# Patient Record
Sex: Female | Born: 1935
Health system: Southern US, Community
[De-identification: ages and names within clinical notes are randomized; demographics above are authoritative.]

## PROBLEM LIST (undated history)

## (undated) DIAGNOSIS — E785 Hyperlipidemia, unspecified: Secondary | ICD-10-CM

## (undated) DIAGNOSIS — I701 Atherosclerosis of renal artery: Secondary | ICD-10-CM

## (undated) DIAGNOSIS — I517 Cardiomegaly: Secondary | ICD-10-CM

## (undated) DIAGNOSIS — I771 Stricture of artery: Secondary | ICD-10-CM

## (undated) DIAGNOSIS — E079 Disorder of thyroid, unspecified: Secondary | ICD-10-CM

## (undated) DIAGNOSIS — R011 Cardiac murmur, unspecified: Secondary | ICD-10-CM

## (undated) DIAGNOSIS — I10 Essential (primary) hypertension: Secondary | ICD-10-CM

## (undated) DIAGNOSIS — I2510 Atherosclerotic heart disease of native coronary artery without angina pectoris: Secondary | ICD-10-CM

## (undated) DIAGNOSIS — G4730 Sleep apnea, unspecified: Secondary | ICD-10-CM

## (undated) HISTORY — DX: Cardiomegaly: I51.7

## (undated) HISTORY — DX: Atherosclerotic heart disease of native coronary artery without angina pectoris: I25.10

## (undated) HISTORY — DX: Atherosclerosis of renal artery: I70.1

## (undated) HISTORY — DX: Sleep apnea, unspecified: G47.30

## (undated) HISTORY — DX: Cardiac murmur, unspecified: R01.1

## (undated) HISTORY — DX: Essential (primary) hypertension: I10

## (undated) HISTORY — DX: Stricture of artery: I77.1

## (undated) HISTORY — PX: CHOLECYSTECTOMY: SHX55

## (undated) HISTORY — DX: Hyperlipidemia, unspecified: E78.5

## (undated) HISTORY — DX: Disorder of thyroid, unspecified: E07.9

---

## 1999-09-15 ENCOUNTER — Other Ambulatory Visit: Admission: RE | Admit: 1999-09-15 | Discharge: 1999-09-15 | Payer: Self-pay | Admitting: Internal Medicine

## 1999-12-04 ENCOUNTER — Inpatient Hospital Stay (HOSPITAL_COMMUNITY): Admission: EM | Admit: 1999-12-04 | Discharge: 1999-12-06 | Payer: Self-pay | Admitting: Emergency Medicine

## 1999-12-04 ENCOUNTER — Encounter: Payer: Self-pay | Admitting: Emergency Medicine

## 1999-12-04 ENCOUNTER — Encounter (INDEPENDENT_AMBULATORY_CARE_PROVIDER_SITE_OTHER): Payer: Self-pay | Admitting: *Deleted

## 2008-11-01 ENCOUNTER — Emergency Department (HOSPITAL_COMMUNITY): Admission: EM | Admit: 2008-11-01 | Discharge: 2008-11-01 | Payer: Self-pay | Admitting: Emergency Medicine

## 2010-11-14 ENCOUNTER — Encounter
Admission: RE | Admit: 2010-11-14 | Discharge: 2010-11-14 | Payer: Self-pay | Source: Home / Self Care | Attending: Rheumatology | Admitting: Rheumatology

## 2011-03-24 NOTE — Discharge Summary (Signed)
[facility]  Patient:    Linda Lucero                       MRN: [id number] Adm. Date:  [id number] Disc. Date: [id number] Attending:  Bonnetta Barry CC:         Merlene Laughter. Renae Gloss, M.D.             Madaline Savage, M.D.                           Discharge Summary  REASON FOR ADMISSION:  Acute cholecystitis, cholelithiasis.  HISTORY OF PRESENT ILLNESS:  Patient is a 77 year old black female who presented to the emergency department on December 04, 1999, with a five-hour history of unrelenting right upper quadrant abdominal pain, nausea and vomiting.  The patient was evaluated in the emergency department.  On physical exam, she was suspected to have acute cholecystitis.  Ultrasound of the abdomen showed multiple gallstones and findings consistent with acute cholecystitis.  General surgery was consulted and the patient was admitted on the general surgical service for administration of intravenous antibiotics in preparation for the operating room.  HOSPITAL COURSE:  Patient was admitted on November 24, 1999.  She was started on  intravenous antibiotics.  Patient was prepared and taken to the operating room n the morning of December 05, 1999.  She underwent laparoscopic cholecystectomy without complication.  Postoperative course was straightforward.  She was prepared for discharge home on December 06, 1999.  DISCHARGE PLANNING:  Patient was prepared for discharge on December 06, 1999. She is tolerating a soft, regular diet without difficulty.  Patient was ambulating independently.  The patient was controlling her pain with oral Vicodin. Patient will be seen back in my office at [facility] in two weeks.  FINAL DIAGNOSIS:  Acute cholecystitis, cholelithiasis.  CONDITION AT DISCHARGE:  Improved. DD:  01/03/00 TD:  01/03/00 Job: [id number]

## 2011-03-24 NOTE — H&P (Signed)
. [facility]  Patient:    Linda Lucero                       MRN: [id number] Adm. Date:  [id number] Attending:  Bonnetta Barry CC:         Madaline Savage, M.D.             Merlene Laughter. Renae Gloss, M.D.                         History and Physical  REASON FOR ADMISSION:  Acute cholecystitis, cholelithiasis.  BRIEF HISTORY:  Patient is a 77 year old black female who presented to the Emergency Department at 10 a.m. on December 04, 1999, with a five hour history of unrelenting right upper quadrant abdominal pain, nausea, and vomiting.  Patient was evaluated in the Emergency Department.  She underwent ultrasound of the abdomen  which demonstrated multiple gallstones with a positive Murphys sign and suspected acute cholecystitis.  Laboratory studies showed a white blood cell count in the  upper range of normal at 10,000 with a left shift of 81% neutrophils.  Liver function tests were normal.  Patient was afebrile.  General surgery was consulted at 5 p.m. on December 04, 1999 for consideration for admission for cholecystitis. Patient has no prior history of hepatobiliary disease.  She has had intermittent abdominal pain over the past several years for which she has taken antiacid medications.  She denies any history of jaundice or acholic stools.  She denies any fevers or chills.  Patient has had no prior abdominal surgery.  PAST MEDICAL HISTORY:  History of hypertension, history of hypothyroidism with  history of iodine radiation as a child.  PRIMARY CARE PHYSICIAN:  Merlene Laughter. Renae Gloss, M.D.  CARDIOLOGIST:  Madaline Savage, M.D.  MEDICATIONS: 1. Dyazide daily. 2. Prinivil 20 mg daily. 3. Norvasc 10 mg daily. 4. Synthroid 0.1 mg daily.  ALLERGIES:  None known.  SOCIAL HISTORY:  Patient lives alone here in [place].  She has a son who visits her daily.  She does not use tobacco.  She drinks alcohol on rare occasion. 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.  FAMILY HISTORY:  Noncontributory.  REVIEW OF SYSTEMS:  As per history of present illness.  PHYSICAL EXAMINATION:  GENERAL:  A 77 year old moderately obese black female in mild discomfort on a stretcher in the Emergency Department.  VITAL SIGNS:  Temperature 98.8, pulse 60, respirations 22, blood pressure 170/50.  HEENT:  Normocephalic.  Sclerae are clear.  Mucous membranes are dry.  NECK:  Supple.  Thyroid is normal without masses.  Voice quality is normal.  CHEST:  Clear to auscultation bilaterally.  There is no costovertebral angle tenderness.  CARDIAC:  Shows a regular rate and rhythm without murmur.  Peripheral pulses are full.  ABDOMEN:  Soft, obese, with bowel sounds present.  There is tenderness to percussion and palpation in the right upper quadrant.  There is no rebound tenderness.  There is no palpable mass.  There is a mild Murphys sign present with deep inspiration.  There are no previous surgical wounds.  EXTREMITIES:  Nontender without edema.  NEUROLOGIC:  The patient is alert and oriented to person, place, and time without focal neurologic deficit.  LABORATORY STUDIES:  White blood cell count is 10,100 with 81% segmented neutrophils, 15% lymphocytes, 2% monocytes, 1% eosinophils.  Hemoglobin 11.9, hematocrit 34.7%.  Chemistry profile  is essentially normal with the following abnormalities:  potassium 3.4, glucose 148.  Liver function tests are normal. Amylase is normal at 96.  Lipase is normal at 24.  Urinalysis shows 0-5 white cells and 0-5 red cells per high power field.  Radiographic studies:  Chest x-ray shows no active disease.  Abdominal films show nonobstructive bowel gas pattern with some element of constipation.  Ultrasound of the abdomen shows gallstones with a Murphys sign present.  Bile duct is normal. I suspect cholecystitis.  IMPRESSION:  Acute cholecystitis,  cholelithiasis.  PLAN: 1. Admission to [facility]. 2. Initiation of intravenous hydration. 3. Cefotan 1 g IV piggyback q.12. 4. Preoperative assessment and preparation for surgery on December 05, 1999,    with planned laparoscopic cholecystectomy.  Patient is admitted on this general surgery service.  Her diagnosis as well as planned procedure has been discussed with her at length.  She understands.  She  understands the possibility of conversion from a laparoscopic to an open technique in order to remove the gallbladder successfully.  She wishes to proceed as soon as possible.  We will make these arrangements with the [facility] operating room.DD:  12/04/99 TD:  12/04/99 Job: [id number]

## 2011-03-24 NOTE — Op Note (Signed)
[place]  Patient:    Linda Lucero                       MRN: [id number] Proc. Date: 12/05/99 Adm. Date:  [id number] Attending:  Bonnetta Barry CC:         Madaline Savage, M.D.             Merlene Laughter. Renae Gloss, M.D.                           Operative Report  PREOPERATIVE DIAGNOSIS:  Acute cholecystitis, cholelithiasis.  POSTOPERATIVE DIAGNOSIS:  Acute cholecystitis, cholelithiasis.  PROCEDURE:  Laparoscopic cholecystectomy.  SURGEON:  Velora Heckler, M.D.  ASSISTANT:  Catalina Lunger, M.D.  ANESTHESIA:  General per Dr. Ivin Booty.  ESTIMATED BLOOD LOSS:  Less than 100 cc.  PREPARATION:  Betadine.  COMPLICATIONS:  None.  INDICATIONS:  Patient is a 77 year old black female, who presents to the emergency department with abdominal pain.  Ultrasound demonstrated multiple gallstones. Physical examination was consistent with acute cholecystitis.  Patient was admitted for intravenous antibiotics and is now brought to the operating room for cholecystectomy.  BODY OF REPORT:  Procedure was done in O.R. #9 at the [facility].  Patient was brought to the operating room, placed in the supine position on the operating room table.  Following administration of general anesthesia, the patient was positioned and prepped and draped in usual strict aseptic fashion.  After ascertaining that an adequate level of anesthesia had been obtained, an infraumbilical incision was made with a #15 blade.  Dissection was carried down to the fascia.  The fascia was incised in the midline, the peritoneal cavity is entered cautiously.  An 0 Vicryl purse-string suture was placed in the fascia. An Hasson cannula is introduced under direct vision and secured with the purse-string suture.  The abdomen is insufflated with carbon dioxide.  Laparoscope is introduced under direct vision and the abdomen explored.  There are omental  adhesions in the right upper quadrant to the gallbladder and the anterior abdominal wall.  An operative port is placed in the subxiphoid position.  Using the electrocautery,  adhesions were taken down and hemostasis obtained.  Next, two ports were placed in right abdominal wall.  The gallbladder is aspirated with an aspiration trocar. The gallbladder wall is markedly thickened, edematous and erythematous consistent with acute cholecystitis.  Gallbladder is grasped and retracted cephalad. Dissection is begun at the neck of the gallbladder.  Peritoneum is incised.  Cystic duct is dissected out along its length, triply clipped and divided.  The cystic artery s dissected out along its length, doubly clipped, and divided.  After mobilization of the neck of the gallbladder, a posterior branch of the cystic artery is identified, doubly clipped, and divided.  The gallbladder is then excised from the gallbladder bed using the spatula electrocautery for hemostasis.  The bed is acutely inflamed with moderate edema.   Gallbladder is completely freed from the undersurface of the liver and then extracted through the umbilical port.  It contained multiple gallstones ranging in size up to 2 cm.  It is submitted to pathology for review. The 0 Vicryl purse-string suture at the umbilicus is tied securely. Subcutaneous tissues were irrigated.  Right upper quadrant is irrigated and good hemostasis as noted.  Fluid was evacuated.  Ports are removed under direct vision.  Good hemostasis was noted.  Pneumoperitoneum was released and all ports were removed. Port sites were anesthetized with local anesthetic.  Midline surgical wounds were closed with interrupted 4-0 Vicryl subcuticular sutures.  All four wounds were wounds are washed and dried and benzoin and Steri-Strips are applied.   Sterile  gauze dressings are applied.  Patient is awakened from anesthesia and brought to the recovery room in  stable condition.  The patient tolerated the procedure well. DD:  12/05/99 TD:  12/05/99 Job: [id number]

## 2011-05-26 ENCOUNTER — Ambulatory Visit
Admission: RE | Admit: 2011-05-26 | Discharge: 2011-05-26 | Disposition: A | Discharge status: Home / Self Care | Payer: Medicare Other | Source: Ambulatory Visit | Attending: Rheumatology | Admitting: Rheumatology

## 2011-05-26 ENCOUNTER — Other Ambulatory Visit: Payer: Self-pay | Admitting: Rheumatology

## 2011-05-26 DIAGNOSIS — M549 Dorsalgia, unspecified: Secondary | ICD-10-CM

## 2012-09-19 ENCOUNTER — Other Ambulatory Visit (HOSPITAL_COMMUNITY): Payer: Self-pay | Admitting: Cardiovascular Disease

## 2012-09-19 DIAGNOSIS — I701 Atherosclerosis of renal artery: Secondary | ICD-10-CM

## 2012-09-25 ENCOUNTER — Ambulatory Visit (HOSPITAL_COMMUNITY)
Admission: RE | Admit: 2012-09-25 | Discharge: 2012-09-25 | Disposition: A | Discharge status: Home / Self Care | Payer: Medicare Other | Source: Ambulatory Visit | Attending: Cardiology | Admitting: Cardiology

## 2012-09-25 DIAGNOSIS — I701 Atherosclerosis of renal artery: Secondary | ICD-10-CM | POA: Insufficient documentation

## 2012-09-26 NOTE — Progress Notes (Signed)
Renal Duplex Completed. Kristoph Sattler D  

## 2012-10-02 ENCOUNTER — Other Ambulatory Visit (HOSPITAL_COMMUNITY): Payer: Self-pay | Admitting: Cardiology

## 2015-06-10 ENCOUNTER — Encounter: Payer: Self-pay | Admitting: *Deleted

## 2015-07-27 ENCOUNTER — Encounter: Payer: Self-pay | Admitting: Cardiology

## 2015-11-12 DIAGNOSIS — E039 Hypothyroidism, unspecified: Secondary | ICD-10-CM | POA: Diagnosis not present

## 2015-11-12 DIAGNOSIS — I119 Hypertensive heart disease without heart failure: Secondary | ICD-10-CM | POA: Diagnosis not present

## 2015-11-12 DIAGNOSIS — E782 Mixed hyperlipidemia: Secondary | ICD-10-CM | POA: Diagnosis not present

## 2016-01-05 DIAGNOSIS — I10 Essential (primary) hypertension: Secondary | ICD-10-CM | POA: Diagnosis not present

## 2016-01-05 DIAGNOSIS — M25511 Pain in right shoulder: Secondary | ICD-10-CM | POA: Diagnosis not present

## 2016-01-05 DIAGNOSIS — M25562 Pain in left knee: Secondary | ICD-10-CM | POA: Diagnosis not present

## 2016-01-05 DIAGNOSIS — E039 Hypothyroidism, unspecified: Secondary | ICD-10-CM | POA: Diagnosis not present

## 2016-01-17 ENCOUNTER — Encounter: Payer: Self-pay | Admitting: Internal Medicine

## 2016-05-15 DIAGNOSIS — E782 Mixed hyperlipidemia: Secondary | ICD-10-CM | POA: Diagnosis not present

## 2016-05-15 DIAGNOSIS — R0600 Dyspnea, unspecified: Secondary | ICD-10-CM | POA: Diagnosis not present

## 2016-05-15 DIAGNOSIS — E039 Hypothyroidism, unspecified: Secondary | ICD-10-CM | POA: Diagnosis not present

## 2016-05-15 DIAGNOSIS — R7303 Prediabetes: Secondary | ICD-10-CM | POA: Diagnosis not present

## 2016-05-15 DIAGNOSIS — I119 Hypertensive heart disease without heart failure: Secondary | ICD-10-CM | POA: Diagnosis not present

## 2016-05-19 ENCOUNTER — Ambulatory Visit: Payer: Self-pay | Admitting: Internal Medicine

## 2016-05-26 ENCOUNTER — Ambulatory Visit: Payer: Self-pay | Admitting: Internal Medicine

## 2016-10-02 DIAGNOSIS — Z13820 Encounter for screening for osteoporosis: Secondary | ICD-10-CM | POA: Diagnosis not present

## 2016-10-02 DIAGNOSIS — R7303 Prediabetes: Secondary | ICD-10-CM | POA: Diagnosis not present

## 2016-10-02 DIAGNOSIS — N959 Unspecified menopausal and perimenopausal disorder: Secondary | ICD-10-CM | POA: Diagnosis not present

## 2016-10-02 DIAGNOSIS — I10 Essential (primary) hypertension: Secondary | ICD-10-CM | POA: Diagnosis not present

## 2016-10-02 DIAGNOSIS — E039 Hypothyroidism, unspecified: Secondary | ICD-10-CM | POA: Diagnosis not present

## 2016-10-02 DIAGNOSIS — E782 Mixed hyperlipidemia: Secondary | ICD-10-CM | POA: Diagnosis not present

## 2016-10-02 DIAGNOSIS — Z0000 Encounter for general adult medical examination without abnormal findings: Secondary | ICD-10-CM | POA: Diagnosis not present

## 2017-01-01 DIAGNOSIS — Z87891 Personal history of nicotine dependence: Secondary | ICD-10-CM | POA: Diagnosis not present

## 2017-01-01 DIAGNOSIS — E782 Mixed hyperlipidemia: Secondary | ICD-10-CM | POA: Diagnosis not present

## 2017-01-01 DIAGNOSIS — E039 Hypothyroidism, unspecified: Secondary | ICD-10-CM | POA: Diagnosis not present

## 2017-01-01 DIAGNOSIS — I10 Essential (primary) hypertension: Secondary | ICD-10-CM | POA: Diagnosis not present

## 2017-04-18 DIAGNOSIS — I10 Essential (primary) hypertension: Secondary | ICD-10-CM | POA: Diagnosis not present

## 2017-04-18 DIAGNOSIS — N183 Chronic kidney disease, stage 3 (moderate): Secondary | ICD-10-CM | POA: Diagnosis not present

## 2017-04-18 DIAGNOSIS — E039 Hypothyroidism, unspecified: Secondary | ICD-10-CM | POA: Diagnosis not present

## 2017-04-18 DIAGNOSIS — I129 Hypertensive chronic kidney disease with stage 1 through stage 4 chronic kidney disease, or unspecified chronic kidney disease: Secondary | ICD-10-CM | POA: Diagnosis not present

## 2017-04-18 DIAGNOSIS — E782 Mixed hyperlipidemia: Secondary | ICD-10-CM | POA: Diagnosis not present

## 2017-07-26 DIAGNOSIS — I10 Essential (primary) hypertension: Secondary | ICD-10-CM | POA: Diagnosis not present

## 2017-07-26 DIAGNOSIS — E039 Hypothyroidism, unspecified: Secondary | ICD-10-CM | POA: Diagnosis not present

## 2017-07-26 DIAGNOSIS — R252 Cramp and spasm: Secondary | ICD-10-CM | POA: Diagnosis not present

## 2017-08-12 ENCOUNTER — Encounter (HOSPITAL_COMMUNITY): Payer: Self-pay | Admitting: Emergency Medicine

## 2017-08-12 ENCOUNTER — Emergency Department (HOSPITAL_COMMUNITY): Payer: Medicare HMO

## 2017-08-12 ENCOUNTER — Emergency Department (HOSPITAL_COMMUNITY)
Admission: EM | Admit: 2017-08-12 | Discharge: 2017-08-12 | Disposition: A | Discharge status: Home / Self Care | Payer: Medicare HMO | Attending: Emergency Medicine | Admitting: Emergency Medicine

## 2017-08-12 DIAGNOSIS — R42 Dizziness and giddiness: Secondary | ICD-10-CM

## 2017-08-12 DIAGNOSIS — I10 Essential (primary) hypertension: Secondary | ICD-10-CM | POA: Diagnosis not present

## 2017-08-12 DIAGNOSIS — R002 Palpitations: Secondary | ICD-10-CM | POA: Insufficient documentation

## 2017-08-12 DIAGNOSIS — I2510 Atherosclerotic heart disease of native coronary artery without angina pectoris: Secondary | ICD-10-CM | POA: Insufficient documentation

## 2017-08-12 DIAGNOSIS — R079 Chest pain, unspecified: Secondary | ICD-10-CM | POA: Diagnosis not present

## 2017-08-12 LAB — BASIC METABOLIC PANEL
ANION GAP: 9 (ref 5–15)
BUN: 22 mg/dL — ABNORMAL HIGH (ref 6–20)
CALCIUM: 9.3 mg/dL (ref 8.9–10.3)
CHLORIDE: 107 mmol/L (ref 101–111)
CO2: 22 mmol/L (ref 22–32)
Creatinine, Ser: 1.37 mg/dL — ABNORMAL HIGH (ref 0.44–1.00)
GFR calc non Af Amer: 34 mL/min — ABNORMAL LOW (ref >60)
GFR, EST AFRICAN AMERICAN: 39 mL/min — AB (ref >60)
Glucose, Bld: 107 mg/dL — ABNORMAL HIGH (ref 65–99)
Potassium: 3.7 mmol/L (ref 3.5–5.1)
Sodium: 138 mmol/L (ref 135–145)

## 2017-08-12 LAB — I-STAT TROPONIN, ED: TROPONIN I, POC: 0.02 ng/mL (ref 0.00–0.08)

## 2017-08-12 LAB — CBC
HCT: 31.9 % — ABNORMAL LOW (ref 36.0–46.0)
HEMOGLOBIN: 10.8 g/dL — AB (ref 12.0–15.0)
MCH: 21.5 pg — AB (ref 26.0–34.0)
MCHC: 33.9 g/dL (ref 30.0–36.0)
MCV: 63.5 fL — ABNORMAL LOW (ref 78.0–100.0)
Platelets: 242 10*3/uL (ref 150–400)
RBC: 5.02 MIL/uL (ref 3.87–5.11)
RDW: 16.3 % — ABNORMAL HIGH (ref 11.5–15.5)
WBC: 8.2 10*3/uL (ref 4.0–10.5)

## 2017-08-12 MED ORDER — ONDANSETRON 4 MG PO TBDP
4.0000 mg | ORAL_TABLET | Freq: Once | ORAL | Status: AC
  Administered 2017-08-12: 4 mg via ORAL
  Filled 2017-08-12: qty 1

## 2017-08-12 NOTE — ED Notes (Signed)
Family at bedside. 

## 2017-08-12 NOTE — ED Provider Notes (Signed)
[facility]-EMERGENCY DEPT Provider Note   CSN: [id number] Arrival date & time: 08/12/17  1034     History   Chief Complaint Chief Complaint  Patient presents with  . Chest Pain    HPI Linda Lucero is a 83 y.o. female.  Patient with the complaint of not feeling well. Had heart palpitations and dizziness that started during the night. No syncope no chest pain no abdominal pain. Did have some nausea but no vomiting. Patient now asymptomatic.      Past Medical History:  Diagnosis Date  . Coronary artery disease   . Hyperlipidemia   . Hypertension   . Renal artery stenosis (HCC)   . Sleep apnea   . Subclavian arterial stenosis (HCC)   . Systolic murmur   . Thyroid disease   . Ventricular hypertrophy     There are no active problems to display for this patient.   No past surgical history on file.  OB History    No data available       Home Medications    Prior to Admission medications   Not on File    Family History Family History  Problem Relation Age of Onset  . Heart disease Mother   . Lung cancer Father     Social History Social History  Substance Use Topics  . Smoking status: Not on file  . Smokeless tobacco: Not on file  . Alcohol use Not on file     Allergies   Diovan [valsartan] and Doxazosin   Review of Systems Review of Systems  Constitutional: Positive for fatigue. Negative for fever.  HENT: Negative for congestion.   Eyes: Negative for visual disturbance.  Respiratory: Negative for shortness of breath.   Cardiovascular: Positive for palpitations. Negative for chest pain.  Gastrointestinal: Positive for nausea. Negative for abdominal pain.  Genitourinary: Negative for dysuria.  Musculoskeletal: Negative for back pain.  Skin: Negative for rash.  Neurological: Positive for dizziness. Negative for syncope, weakness, numbness and headaches.  Hematological: Does not bruise/bleed easily.  Psychiatric/Behavioral: Negative for  confusion.     Physical Exam Updated Vital Signs BP (!) 152/62   Pulse 83   Temp 98.2 F (36.8 C)   Resp 13   SpO2 98%   Physical Exam  Constitutional: She is oriented to person, place, and time. She appears well-developed and well-nourished. No distress.  HENT:  Head: Normocephalic and atraumatic.  Mouth/Throat: Oropharynx is clear and moist.  Eyes: Pupils are equal, round, and reactive to light. Conjunctivae and EOM are normal.  Neck: Normal range of motion. Neck supple.  Cardiovascular: Normal rate, regular rhythm and normal heart sounds.   Pulmonary/Chest: Effort normal and breath sounds normal. No respiratory distress.  Abdominal: Soft. Bowel sounds are normal. There is no tenderness.  Musculoskeletal: Normal range of motion. She exhibits no edema.  Neurological: She is alert and oriented to person, place, and time. No cranial nerve deficit or sensory deficit. She exhibits normal muscle tone. Coordination normal.  Skin: Skin is warm.  Nursing note and vitals reviewed.    ED Treatments / Results  Labs (all labs ordered are listed, but only abnormal results are displayed) Labs Reviewed  BASIC METABOLIC PANEL - Abnormal; Notable for the following:       Result Value   Glucose, Bld 107 (*)    BUN 22 (*)    Creatinine, Ser 1.37 (*)    GFR calc non Af Amer 34 (*)    GFR calc Af Amer 39 (*)  All other components within normal limits  CBC - Abnormal; Notable for the following:    Hemoglobin 10.8 (*)    HCT 31.9 (*)    MCV 63.5 (*)    MCH 21.5 (*)    RDW 16.3 (*)    All other components within normal limits  I-STAT TROPONIN, ED    EKG  EKG Interpretation  Date/Time:  Sunday August 12 2017 10:37:25 EDT Ventricular Rate:  83 PR Interval:  142 QRS Duration: 92 QT Interval:  364 QTC Calculation: 427 R Axis:   10 Text Interpretation:  Sinus rhythm with occasional Premature ventricular complexes Left ventricular hypertrophy Nonspecific ST and T wave abnormality  Abnormal ECG No previous ECGs available Confirmed by Vanetta Mulders [phone]) on 08/12/2017 10:42:48 AM       Radiology Dg Chest 2 View  Result Date: 08/12/2017 CLINICAL DATA:  Pt c/o of sob, throbbing heart, AND nausea since last night. Pt denies pain AND dizziness. Non smoker EXAM: CHEST  2 VIEW COMPARISON:  None. FINDINGS: The cardiac silhouette is mildly enlarged. No mediastinal or hilar masses. No convincing adenopathy. Lungs are hyperexpanded but clear. No pleural effusion or pneumothorax. The skeletal structures are demineralized but intact. IMPRESSION: No acute cardiopulmonary disease. Electronically Signed   By: Amie Portland M.D.   On: 08/12/2017 11:08    Procedures Procedures (including critical care time)  Medications Ordered in ED Medications  ondansetron (ZOFRAN-ODT) disintegrating tablet 4 mg (4 mg Oral Given 08/12/17 1137)     Initial Impression / Assessment and Plan / ED Course  I have reviewed the triage vital signs and the nursing notes.  Pertinent labs & imaging results that were available during my care of the patient were reviewed by me and considered in my medical decision making (see chart for details).     On arrival patient essentially asymptomatic. Patient without headache. Had an episode of dizziness and episode of heart palpitations. Cardiac monitoring here without any arrhythmias. Patient was originally hypertensive had her take her normal blood pressure medicines and blood pressure improves significant family and patient also improved. Even though she was a symptomatically she felt much better after taking her blood pressure medicine. Patient has a primary care follow-up. On lab abnormalities with some mild renal insufficiency which will require follow-up. Blood pressure will require follow-up as well. Patient will return for any new or worse symptoms.    Final Clinical Impressions(s) / ED Diagnoses   Final diagnoses:  Heart palpitations  Dizziness     New Prescriptions New Prescriptions   No medications on file     Vanetta Mulders, MD 08/12/17 1438

## 2017-08-12 NOTE — ED Triage Notes (Signed)
Pt states this morning new onset epigastric/chest throbbing, non radiating. Pain she rates 0/10. Pt does states nausea. Denies dizziness.

## 2017-08-12 NOTE — ED Notes (Signed)
Pt in xray, family member brought to room to wait for pt to return.

## 2017-08-12 NOTE — ED Notes (Signed)
Pt denies concerns with dc 

## 2017-08-12 NOTE — ED Notes (Signed)
Pt bp is elevated stated she did not take all of her home bp meds this morning. Per edp, okay for patient to take her home medications. Pt is taking her Amlodipine and HCTZ and Synthroid. Will continue to monitor.

## 2017-08-12 NOTE — Discharge Instructions (Signed)
Today's workup without any significant abnormalities other than some mild renal insufficiency. Would recommend a have renal function rechecked in about 2 weeks. Can follow-up with the wellness clinic if you do not have a primary care doctor locally. Return for any recurrent new or worse symptoms. Return for rapid heart rate lasting 40 minutes or longer.

## 2017-10-24 ENCOUNTER — Encounter: Payer: Self-pay | Admitting: Family Medicine

## 2017-10-24 DIAGNOSIS — I10 Essential (primary) hypertension: Secondary | ICD-10-CM | POA: Diagnosis not present

## 2017-10-24 DIAGNOSIS — E782 Mixed hyperlipidemia: Secondary | ICD-10-CM | POA: Diagnosis not present

## 2017-10-24 DIAGNOSIS — R7303 Prediabetes: Secondary | ICD-10-CM | POA: Diagnosis not present

## 2017-10-24 DIAGNOSIS — M2550 Pain in unspecified joint: Secondary | ICD-10-CM | POA: Diagnosis not present

## 2017-10-24 DIAGNOSIS — I129 Hypertensive chronic kidney disease with stage 1 through stage 4 chronic kidney disease, or unspecified chronic kidney disease: Secondary | ICD-10-CM | POA: Diagnosis not present

## 2017-10-24 DIAGNOSIS — H6121 Impacted cerumen, right ear: Secondary | ICD-10-CM | POA: Diagnosis not present

## 2017-10-24 DIAGNOSIS — N183 Chronic kidney disease, stage 3 (moderate): Secondary | ICD-10-CM | POA: Diagnosis not present

## 2017-10-24 DIAGNOSIS — E039 Hypothyroidism, unspecified: Secondary | ICD-10-CM | POA: Diagnosis not present

## 2017-11-03 DIAGNOSIS — Z6828 Body mass index (BMI) 28.0-28.9, adult: Secondary | ICD-10-CM | POA: Diagnosis not present

## 2017-11-03 DIAGNOSIS — E569 Vitamin deficiency, unspecified: Secondary | ICD-10-CM | POA: Diagnosis not present

## 2017-11-03 DIAGNOSIS — Z0000 Encounter for general adult medical examination without abnormal findings: Secondary | ICD-10-CM | POA: Diagnosis not present

## 2017-11-03 DIAGNOSIS — Z791 Long term (current) use of non-steroidal anti-inflammatories (NSAID): Secondary | ICD-10-CM | POA: Diagnosis not present

## 2017-11-03 DIAGNOSIS — Z7982 Long term (current) use of aspirin: Secondary | ICD-10-CM | POA: Diagnosis not present

## 2017-11-03 DIAGNOSIS — I10 Essential (primary) hypertension: Secondary | ICD-10-CM | POA: Diagnosis not present

## 2017-11-03 DIAGNOSIS — E039 Hypothyroidism, unspecified: Secondary | ICD-10-CM | POA: Diagnosis not present

## 2017-11-03 DIAGNOSIS — M13112 Monoarthritis, not elsewhere classified, left shoulder: Secondary | ICD-10-CM | POA: Diagnosis not present

## 2017-11-03 DIAGNOSIS — M13111 Monoarthritis, not elsewhere classified, right shoulder: Secondary | ICD-10-CM | POA: Diagnosis not present

## 2017-11-03 DIAGNOSIS — M47816 Spondylosis without myelopathy or radiculopathy, lumbar region: Secondary | ICD-10-CM | POA: Diagnosis not present

## 2018-02-28 DIAGNOSIS — I1310 Hypertensive heart and chronic kidney disease without heart failure, with stage 1 through stage 4 chronic kidney disease, or unspecified chronic kidney disease: Secondary | ICD-10-CM | POA: Diagnosis not present

## 2018-02-28 DIAGNOSIS — N183 Chronic kidney disease, stage 3 (moderate): Secondary | ICD-10-CM | POA: Diagnosis not present

## 2018-02-28 DIAGNOSIS — I119 Hypertensive heart disease without heart failure: Secondary | ICD-10-CM | POA: Diagnosis not present

## 2018-02-28 DIAGNOSIS — R7309 Other abnormal glucose: Secondary | ICD-10-CM | POA: Diagnosis not present

## 2018-02-28 DIAGNOSIS — E782 Mixed hyperlipidemia: Secondary | ICD-10-CM | POA: Diagnosis not present

## 2018-02-28 DIAGNOSIS — E039 Hypothyroidism, unspecified: Secondary | ICD-10-CM | POA: Diagnosis not present

## 2018-07-03 DIAGNOSIS — E782 Mixed hyperlipidemia: Secondary | ICD-10-CM | POA: Diagnosis not present

## 2018-07-03 DIAGNOSIS — I119 Hypertensive heart disease without heart failure: Secondary | ICD-10-CM | POA: Diagnosis not present

## 2018-07-03 DIAGNOSIS — E039 Hypothyroidism, unspecified: Secondary | ICD-10-CM | POA: Diagnosis not present

## 2018-07-03 DIAGNOSIS — N183 Chronic kidney disease, stage 3 (moderate): Secondary | ICD-10-CM | POA: Diagnosis not present

## 2018-07-03 DIAGNOSIS — I1310 Hypertensive heart and chronic kidney disease without heart failure, with stage 1 through stage 4 chronic kidney disease, or unspecified chronic kidney disease: Secondary | ICD-10-CM | POA: Diagnosis not present

## 2018-07-19 DIAGNOSIS — M1990 Unspecified osteoarthritis, unspecified site: Secondary | ICD-10-CM | POA: Diagnosis not present

## 2018-07-19 DIAGNOSIS — Z833 Family history of diabetes mellitus: Secondary | ICD-10-CM | POA: Diagnosis not present

## 2018-07-19 DIAGNOSIS — Z87891 Personal history of nicotine dependence: Secondary | ICD-10-CM | POA: Diagnosis not present

## 2018-07-19 DIAGNOSIS — R252 Cramp and spasm: Secondary | ICD-10-CM | POA: Diagnosis not present

## 2018-07-19 DIAGNOSIS — G8929 Other chronic pain: Secondary | ICD-10-CM | POA: Diagnosis not present

## 2018-07-19 DIAGNOSIS — E039 Hypothyroidism, unspecified: Secondary | ICD-10-CM | POA: Diagnosis not present

## 2018-07-19 DIAGNOSIS — Z8249 Family history of ischemic heart disease and other diseases of the circulatory system: Secondary | ICD-10-CM | POA: Diagnosis not present

## 2018-07-19 DIAGNOSIS — Z791 Long term (current) use of non-steroidal anti-inflammatories (NSAID): Secondary | ICD-10-CM | POA: Diagnosis not present

## 2018-07-19 DIAGNOSIS — I10 Essential (primary) hypertension: Secondary | ICD-10-CM | POA: Diagnosis not present

## 2018-07-31 DIAGNOSIS — M545 Low back pain: Secondary | ICD-10-CM

## 2018-09-04 ENCOUNTER — Encounter: Payer: Self-pay | Admitting: Family Medicine

## 2018-09-04 ENCOUNTER — Ambulatory Visit (INDEPENDENT_AMBULATORY_CARE_PROVIDER_SITE_OTHER): Payer: Medicare HMO | Admitting: Family Medicine

## 2018-09-04 DIAGNOSIS — M545 Low back pain: Secondary | ICD-10-CM | POA: Diagnosis not present

## 2018-09-04 DIAGNOSIS — Z23 Encounter for immunization: Secondary | ICD-10-CM | POA: Diagnosis not present

## 2018-09-04 DIAGNOSIS — E890 Postprocedural hypothyroidism: Secondary | ICD-10-CM | POA: Diagnosis not present

## 2018-09-04 DIAGNOSIS — N183 Chronic kidney disease, stage 3 unspecified: Secondary | ICD-10-CM

## 2018-09-04 DIAGNOSIS — G8929 Other chronic pain: Secondary | ICD-10-CM | POA: Diagnosis not present

## 2018-09-04 DIAGNOSIS — I10 Essential (primary) hypertension: Secondary | ICD-10-CM

## 2018-09-04 NOTE — Patient Instructions (Addendum)
Trial of tylenol arthritis for back pain. OK to take with aspirin.   Call me in 2 weeks if you haven't heard from me to make sure I received your records.   Check blood pressures at home so you can report back to me when I call you.

## 2018-09-04 NOTE — Progress Notes (Signed)
Linda Lucero DOB: [birth year] Encounter date: 09/04/2018  This is a 84 y.o. female who presents to establish care. Chief Complaint  Patient presents with  . New Patient (Initial Visit)    back pain, radiates to left,     History of present illness: Lower back pain; worse on rainy days like today. Osteoarthritis in left hip; going to back. Been bothering her for past 5 years; but worsening. Stretching, lying down helps. Meloxicam did help somewhat, but she was taken off of this due to kidney strain. Aspirin does help. Standing too long aggravates. Straight across lower back. Didn't want to take anything stronger.    BP fluctuates. Lowest she has seen was 137/80. States she has some white coat syndrome. Had wrist cuff that broke so hasn't checked in awhile.   Had radioactive iodine at age of 67; has been on replacement since that time.   Wasn't aware of kidney issue until last year.   Wakes up same time right on schedule. Can't sleep in. Feels tired through day. Sometimes naps. Not sure what wakes her. Can't fall back asleep. Mouth is dry. Not sure if she snores.   Was following with cardiology for htn. Had worked with him so was getting things checked through that office.    Past Medical History:  Diagnosis Date  . Coronary artery disease   . Hyperlipidemia   . Hypertension   . Renal artery stenosis (HCC)   . Sleep apnea   . Subclavian arterial stenosis (HCC)   . Systolic murmur   . Thyroid disease   . Ventricular hypertrophy    Past Surgical History:  Procedure Laterality Date  . CHOLECYSTECTOMY     Allergies  Allergen Reactions  . Diovan [Valsartan]   . Doxazosin    Current Meds  Medication Sig  . amLODipine (NORVASC) 10 MG tablet Take 10 mg by mouth 2 (two) times daily.  . hydrALAZINE (APRESOLINE) 50 MG tablet TAKE 1 TABLET BY MOUTH 3 TIMES A DAY WITH FOOD  . levothyroxine (SYNTHROID, LEVOTHROID) 150 MCG tablet Take 150 mcg by mouth daily.  Marland Kitchen  losartan-hydrochlorothiazide (HYZAAR) 50-12.5 MG tablet Take 1 tablet by mouth daily.  . Magnesium Oxide 250 MG TABS Take by mouth.   Social History   Tobacco Use  . Smoking status: Former Smoker    Types: Cigarettes  . Smokeless tobacco: Never Used  Substance Use Topics  . Alcohol use: Not on file   Family History  Problem Relation Age of Onset  . Heart disease Mother   . High blood pressure Mother   . Lung cancer Father   . Diabetes Maternal Grandmother   . Heart disease Maternal Grandmother      Review of Systems  Constitutional: Negative for chills, fatigue and fever.  Respiratory: Negative for cough, chest tightness, shortness of breath and wheezing.   Cardiovascular: Negative for chest pain, palpitations and leg swelling.  Musculoskeletal: Positive for arthralgias and back pain.    Objective:  BP (!) 174/64   Pulse 94   Temp 98.3 F (36.8 C) (Oral)   Ht 5' 4.5" (1.638 m)   Wt 178 lb 8 oz (81 kg)   SpO2 100%   BMI 30.17 kg/m   Weight: 178 lb 8 oz (81 kg)   BP Readings from Last 3 Encounters:  09/04/18 (!) 174/64  08/12/17 (!) 152/62   Wt Readings from Last 3 Encounters:  09/04/18 178 lb 8 oz (81 kg)    Physical Exam  Constitutional:  She is oriented to person, place, and time. She appears well-developed and well-nourished. No distress.  Cardiovascular: Normal rate and regular rhythm.  Occasional extrasystoles are present. Exam reveals no friction rub.  Murmur heard.  Systolic murmur is present with a grade of 2/6. No lower extremity edema  Pulmonary/Chest: Effort normal and breath sounds normal. No respiratory distress. She has no wheezes. She has no rales.  Musculoskeletal:  paralumbar spasm; There is bilat SI tenderness  Neurological: She is alert and oriented to person, place, and time.  Psychiatric: Her behavior is normal. Cognition and memory are normal.    Assessment/Plan: 1. Encounter for immunization - Flu vaccine HIGH DOSE PF  2. Chronic  bilateral low back pain, unspecified whether sciatica present Trial tylenol arthritis OTC; let me know if not working. Avoiding NSAIDs due to CKD.  3. Hypertension, unspecified type Elevated on exam. Encouraged home checking. Follow up pending record review. Has had difficulty controlling systolic due to lower diastolic in past (per patient).  4. Chronic kidney disease (CKD), stage III (moderate) (HCC) Will get most recent bloodwork and follow up pending these results.   5. Postablative hypothyroidism Records have been requested. Continue synthroid; dose adjustments pending bloodwork.    Return pending record review.  Theodis Shove, MD

## 2018-09-06 ENCOUNTER — Encounter: Payer: Self-pay | Admitting: Family Medicine

## 2018-09-06 DIAGNOSIS — N1831 Chronic kidney disease, stage 3a: Secondary | ICD-10-CM | POA: Insufficient documentation

## 2018-09-06 DIAGNOSIS — N183 Chronic kidney disease, stage 3 (moderate): Secondary | ICD-10-CM

## 2018-09-06 DIAGNOSIS — E039 Hypothyroidism, unspecified: Secondary | ICD-10-CM | POA: Insufficient documentation

## 2018-09-06 DIAGNOSIS — M545 Low back pain, unspecified: Secondary | ICD-10-CM | POA: Insufficient documentation

## 2018-09-06 DIAGNOSIS — I10 Essential (primary) hypertension: Secondary | ICD-10-CM | POA: Insufficient documentation

## 2018-09-06 MED ORDER — ASPIRIN EC 325 MG PO TBEC: 325.0000 mg | DELAYED_RELEASE_TABLET | Freq: Every day | ORAL | 0 refills | Status: DC

## 2018-09-06 MED ORDER — VITAMIN D 1000 UNITS PO TABS: 1000.0000 [IU] | ORAL_TABLET | Freq: Every day | ORAL | Status: DC

## 2018-09-06 MED ORDER — IPRATROPIUM BROMIDE 0.03 % NA SOLN: 2.0000 | Freq: Two times a day (BID) | NASAL | 12 refills | Status: DC

## 2018-09-16 ENCOUNTER — Other Ambulatory Visit: Payer: Self-pay | Admitting: Nurse Practitioner

## 2018-09-23 ENCOUNTER — Telehealth: Payer: Self-pay | Admitting: Family Medicine

## 2018-09-23 NOTE — Telephone Encounter (Signed)
I did get records:  She will be due for medicare annual in later December so can schedule this with nurse.

## 2018-09-26 NOTE — Telephone Encounter (Signed)
Left a detailed message on verified voice mail.   

## 2018-09-27 ENCOUNTER — Telehealth: Payer: Self-pay | Admitting: *Deleted

## 2018-09-27 NOTE — Telephone Encounter (Signed)
Copied from CRM [id number]. Topic: Medicare AWV >> Sep 26, 2018 10:35 AM Arlyss Gandyichardson, Taren N, NT wrote: Reason for CRM: Pt states she was told to schedule her AWV with [facility], but [facility]. Please advise pt of who she needs to see for this appt.

## 2018-09-27 NOTE — Telephone Encounter (Signed)
Discussed notes with patient, patient expressed understanding

## 2018-09-27 NOTE — Telephone Encounter (Signed)
[facility]. Sorry I didn't realize she was already scheduled.

## 2018-10-07 ENCOUNTER — Other Ambulatory Visit: Payer: Self-pay | Admitting: Internal Medicine

## 2018-10-24 ENCOUNTER — Ambulatory Visit: Payer: Self-pay

## 2018-10-25 ENCOUNTER — Ambulatory Visit: Payer: Self-pay | Admitting: Nurse Practitioner

## 2018-10-28 ENCOUNTER — Ambulatory Visit: Payer: Medicare HMO | Admitting: Nurse Practitioner

## 2018-10-28 ENCOUNTER — Telehealth: Payer: Self-pay | Admitting: Nurse Practitioner

## 2018-10-28 NOTE — Telephone Encounter (Signed)
Spoke with Tiffany - receptionist to make aware patient came in to office today for AWV however [facility] she will notify the nurse to contact the patient to schedule an appt.

## 2018-11-04 ENCOUNTER — Other Ambulatory Visit: Payer: Self-pay | Admitting: Nurse Practitioner

## 2018-11-04 ENCOUNTER — Other Ambulatory Visit: Payer: Self-pay | Admitting: Internal Medicine

## 2018-12-03 ENCOUNTER — Telehealth: Payer: Self-pay | Admitting: Nurse Practitioner

## 2018-12-03 ENCOUNTER — Telehealth: Payer: Self-pay

## 2018-12-03 NOTE — Telephone Encounter (Signed)
Pt. Returned call, and set up CPE with Dr. Hassan RowanKoberlein, as well as AWV for 2/19.

## 2018-12-03 NOTE — Telephone Encounter (Signed)
Called to schedule Medicare Annual Wellness Visit with the Nurse Health Advisor. No Answer. Left message. Thank you! For any questions please contact: Trixie RudeLisa Collins at [phone] or Skype [email or web address]

## 2018-12-03 NOTE — Telephone Encounter (Signed)
Author phoned pt. to set up AWV. No answer; author left VM asking for return call to schedule. If contacting [facility], please ask for or send a message to [place].   Copied from CRM [id number]. Topic: Medicare AWV >> Dec 02, 2018  3:27 PM Gaynelle Adu wrote: Reason for CRM:  patient is needing to schedule at [facility], she stated she hadn't received a call back in regards to getting her schedule. The best contact number is [phone].   Please advise

## 2018-12-19 NOTE — Telephone Encounter (Signed)
Patient returned call from yesterday.  I told her I called to schedule her Medicare Annual Wellness Visit.  Patient states she had told the staff she would not be coming back to see [facility].  I asked patient if she could tell me who she is seeing now and she stated will call me back.    Trixie Rude, Care Guide.

## 2018-12-19 NOTE — Telephone Encounter (Signed)
Called to see if she is still seeing [facility] as her PCP, and to  schedule Medicare Annual Wellness Visit with the Nurse Health Advisor, if she is still seeing PCP at [facility].  No answer.  Left message to call Misty Stanley at [phone].    If patient returns call, please note: their last AWV was on 10/24/2017,  please schedule AWV with [facility] any date AFTER 10/24/2018.  Thank you! For any questions please contact: Trixie Rude, Care Guide at [phone] or Skype [email or web address]

## 2018-12-24 NOTE — Progress Notes (Addendum)
Subjective:   Linda Lucero is a 85 y.o. female who presents for an Initial Medicare Annual Wellness Visit.  Review of Systems    No ROS.  Medicare Wellness Visit. Additional risk factors are reflected in the social history.   Cardiac Risk Factors include: sedentary lifestyle;hypertension;dyslipidemia;advanced age (>68men, >43 women) Sleep patterns: has interrupted sleep, gets up 1 time nightly to void and sleeps 4-5 hours nightly.  States she has had chills for past three weeks, stopped levothyroxine which seemed to help, but still having chills that wake her up at night. Denies night sweats, fevers.  Home Safety/Smoke Alarms: Feels safe in home. Smoke alarms in place.  Living environment; residence and Firearm Safety: 2-story house. Bedroom upstairs, denies need for dme, although struggles with L hip pain.  Seat Belt Safety/Bike Helmet: Wears seat belt.   Female:   Pap- N/A d/t age       Mammo- none on file. Declined at this time.      Dexa scan- none on file. Pt. agreeable to ordering.        CCS- N/A d/t age     Objective:    Today's Vitals   There is no height or weight on file to calculate BMI.  Advanced Directives 12/26/2018 12/25/2018  Does Patient Have a Medical Advance Directive? No No  Would patient like information on creating a medical advance directive? Yes (MAU/Ambulatory/Procedural Areas - Information given) Yes (MAU/Ambulatory/Procedural Areas - Information given)    Current Medications (verified) Outpatient Encounter Medications as of 12/25/2018  Medication Sig  . amLODipine (NORVASC) 10 MG tablet TAKE 1 TABLET BY MOUTH TWICE A DAY  . aspirin EC 325 MG tablet Take 1 tablet (325 mg total) by mouth daily.  . cholecalciferol (VITAMIN D) 1000 units tablet Take 1 tablet (1,000 Units total) by mouth daily.  . hydrALAZINE (APRESOLINE) 50 MG tablet TAKE 1 TABLET BY MOUTH 3 TIMES A DAY WITH FOOD  . ipratropium (ATROVENT) 0.03 % nasal spray Place 2 sprays into both  nostrils every 12 (twelve) hours.  Marland Kitchen losartan-hydrochlorothiazide (HYZAAR) 50-12.5 MG tablet Take 1 tablet by mouth daily.  . Magnesium Oxide 250 MG TABS Take by mouth.  . levothyroxine (SYNTHROID, LEVOTHROID) 150 MCG tablet Take 150 mcg by mouth daily.  . [DISCONTINUED] hydrALAZINE (APRESOLINE) 50 MG tablet TAKE 1 TABLET BY MOUTH 3 TIMES A DAY WITH FOOD   No facility-administered encounter medications on file as of 12/25/2018.     Allergies (verified) Diovan [valsartan] and Doxazosin   History: Past Medical History:  Diagnosis Date  . Coronary artery disease   . Hyperlipidemia   . Hypertension   . Renal artery stenosis (HCC)   . Sleep apnea   . Subclavian arterial stenosis (HCC)   . Systolic murmur   . Thyroid disease   . Ventricular hypertrophy    Past Surgical History:  Procedure Laterality Date  . CHOLECYSTECTOMY     Family History  Problem Relation Age of Onset  . Heart disease Mother   . High blood pressure Mother   . Lung cancer Father   . Diabetes Maternal Grandmother   . Heart disease Maternal Grandmother    Social History   Socioeconomic History  . Marital status: Widowed    Spouse name: Not on file  . Number of children: 4  . Years of education: Not on file  . Highest education level: Not on file  Occupational History    Comment: retired  Engineer, production  . Physicist, medical  strain: Not very hard  . Food insecurity:    Worry: Never true    Inability: Never true  . Transportation needs:    Medical: Yes    Non-medical: Yes  Tobacco Use  . Smoking status: Former Smoker    Types: Cigarettes  . Smokeless tobacco: Never Used  Substance and Sexual Activity  . Alcohol use: Not on file  . Drug use: Not on file  . Sexual activity: Not on file  Lifestyle  . Physical activity:    Days per week: 0 days    Minutes per session: 0 min  . Stress: Not at all  Relationships  . Social connections:    Talks on phone: Once a week    Gets together: Once a week      Attends religious service: Not on file    Active member of club or organization: No    Attends meetings of clubs or organizations: Never    Relationship status: Not on file  Other Topics Concern  . Not on file  Social History Narrative   Lives with daughter in 2 level home; [place], and 2 other children who lives outside of [place].    Tobacco Counseling Counseling given: Not Answered   Activities of Daily Living In your present state of health, do you have any difficulty performing the following activities: 12/26/2018  Hearing? N  Vision? Y  Comment distance somewhat difficult. Pt. declined vision exam via snellen chart  Difficulty concentrating or making decisions? Y  Walking or climbing stairs? Y  Dressing or bathing? N  Doing errands, shopping? N  Preparing Food and eating ? N  Using the Toilet? N  Managing your Medications? N  Managing your Finances? N  Housekeeping or managing your Housekeeping? Y  Comment pt requests assistance  Some recent data might be hidden     Immunizations and Health Maintenance Immunization History  Administered Date(s) Administered  . Influenza, High Dose Seasonal PF 09/04/2018   Health Maintenance Due  Topic Date Due  . TETANUS/TDAP  08/31/1947  . DEXA SCAN  09/01/1995  . PNA vac Low Risk Adult (1 of 2 - PCV13) 09/01/1995    Patient Care Team: Wynn BankerKoberlein, Junell C, MD as PCP - General (Family Medicine)  Indicate any recent Medical Services you may have received from other than [facility] providers in the past year (date may be approximate).     Assessment:   This is a routine wellness examination for Linda Lucero. Physical assessment deferred to PCP.   Hearing/Vision screen Hearing Screening Comments: Able to hear conversational tones w/o difficulty. No issues reported.   Passed whisper test. Vision Screening Comments: States she has not had an exam "in awhile". Ophthamology providers and contact numbers provided for pt. to set  up appointment.  Pt.declined vision exam via snellen chart at this time.  Dietary issues and exercise activities discussed: Current Exercise Habits: The patient does not participate in regular exercise at present, Exercise limited by: psychological condition(s);cardiac condition(s);orthopedic condition(s) Diet (meal preparation, eat out, water intake, caffeinated beverages, dairy products, fruits and vegetables): in general, an "unhealthy" diet. Pt. endorses poor appetite. Eats oatmeal for breakfast, but often skips meals, does not snack much. Pt. States she drinks about three 8oz glasses of water daily. Has been steadily losing weight. Author encouraged pt. To eat high protein when she does eat, and consider seasoning food to make more appetizing. Possibility of following up with GI discussed. List of local providers given.  Goals    . Patient Stated     Less hip pain!       Depression Screen PHQ 2/9 Scores 12/25/2018  PHQ - 2 Score 2  PHQ- 9 Score 6    Fall Risk Fall Risk  12/25/2018  Falls in the past year? 0  Risk for fall due to : Impaired balance/gait;Impaired mobility;Impaired vision  Follow up Falls prevention discussed     Cognitive Function:       Ad8 score reviewed for issues:  Issues making decisions: no  Less interest in hobbies / activities: no  Repeats questions, stories (family complaining): no  Trouble using ordinary gadgets (microwave, computer, phone):no  Forgets the month or year: no  Mismanaging finances: no  Remembering appts: no  Daily problems with thinking and/or memory: yes Ad8 score is= 1  Screening Tests Health Maintenance  Topic Date Due  . TETANUS/TDAP  08[birth year]31/1947  . DEXA SCAN  09/01/1995  . PNA vac Low Risk Adult (1 of 2 - PCV13) 09/01/1995  . INFLUENZA VACCINE  Completed    Qualifies for Shingles Vaccine?       Plan:    Bring a copy of your living will and/or healthcare power of attorney to your next office  visit.  Do pick up wrist BP machine, and do nurse visit for BP recheck, bringing new machine with you.  Refer to eye Dr. resources and get eye exam asap.   Will be in touch about need for prevnar after looking through records provided from Dr. Christell Constant.  Will provide housekeeping and transportation resources for you, will mail.  Dr. Hassan Rowan to be in touch regarding restarting meloxicam once lab results come back.   I have personally reviewed and noted the following in the patient's chart:   . Medical and social history . Use of alcohol, tobacco or illicit drugs  . Current medications and supplements . Functional ability and status . Nutritional status . Physical activity . Advanced directives . List of other physicians . Hospitalizations, surgeries, and ER visits in previous 12 months . Vitals . Screenings to include cognitive, depression, and falls . Referrals and appointments  In addition, I have reviewed and discussed with patient certain preventive protocols, quality metrics, and best practice recommendations. A written personalized care plan for preventive services as well as general preventive health recommendations were provided to patient.     Brayton Layman, RN   12/26/2018     I have reviewed the documentation for the a AWV provided by the health coach and agree with their documentation.  I was immediately available for any questions.  I agree with Advanced Care Planning discussed at visit.  Theodis Shove, MD

## 2018-12-25 ENCOUNTER — Encounter: Payer: Self-pay | Admitting: Family Medicine

## 2018-12-25 ENCOUNTER — Ambulatory Visit (INDEPENDENT_AMBULATORY_CARE_PROVIDER_SITE_OTHER): Payer: Medicare HMO | Admitting: Family Medicine

## 2018-12-25 ENCOUNTER — Ambulatory Visit: Payer: Medicare HMO

## 2018-12-25 VITALS — BP 192/58 | HR 95 | Temp 98.2°F | Ht 65.0 in | Wt 169.4 lb

## 2018-12-25 DIAGNOSIS — N183 Chronic kidney disease, stage 3 unspecified: Secondary | ICD-10-CM

## 2018-12-25 DIAGNOSIS — Z13820 Encounter for screening for osteoporosis: Secondary | ICD-10-CM

## 2018-12-25 DIAGNOSIS — E890 Postprocedural hypothyroidism: Secondary | ICD-10-CM

## 2018-12-25 DIAGNOSIS — Z0000 Encounter for general adult medical examination without abnormal findings: Secondary | ICD-10-CM

## 2018-12-25 DIAGNOSIS — I493 Ventricular premature depolarization: Secondary | ICD-10-CM

## 2018-12-25 DIAGNOSIS — H547 Unspecified visual loss: Secondary | ICD-10-CM | POA: Diagnosis not present

## 2018-12-25 DIAGNOSIS — E785 Hyperlipidemia, unspecified: Secondary | ICD-10-CM | POA: Diagnosis not present

## 2018-12-25 DIAGNOSIS — Z862 Personal history of diseases of the blood and blood-forming organs and certain disorders involving the immune mechanism: Secondary | ICD-10-CM

## 2018-12-25 DIAGNOSIS — I10 Essential (primary) hypertension: Secondary | ICD-10-CM

## 2018-12-25 DIAGNOSIS — M1612 Unilateral primary osteoarthritis, left hip: Secondary | ICD-10-CM | POA: Diagnosis not present

## 2018-12-25 LAB — COMPREHENSIVE METABOLIC PANEL
ALK PHOS: 86 U/L (ref 39–117)
ALT: 8 U/L (ref 0–35)
AST: 20 U/L (ref 0–37)
Albumin: 4.4 g/dL (ref 3.5–5.2)
BUN: 24 mg/dL — AB (ref 6–23)
CHLORIDE: 103 meq/L (ref 96–112)
CO2: 25 meq/L (ref 19–32)
CREATININE: 1.34 mg/dL — AB (ref 0.40–1.20)
Calcium: 10.1 mg/dL (ref 8.4–10.5)
GFR: 45.13 mL/min — ABNORMAL LOW (ref >60.00)
Glucose, Bld: 95 mg/dL (ref 70–99)
Potassium: 3.9 mEq/L (ref 3.5–5.1)
SODIUM: 139 meq/L (ref 135–145)
Total Bilirubin: 0.4 mg/dL (ref 0.2–1.2)
Total Protein: 7.7 g/dL (ref 6.0–8.3)

## 2018-12-25 LAB — CBC WITH DIFFERENTIAL/PLATELET
BASOS PCT: 1 % (ref 0.0–3.0)
Basophils Absolute: 0.1 10*3/uL (ref 0.0–0.1)
EOS ABS: 0 10*3/uL (ref 0.0–0.7)
Eosinophils Relative: 0.4 % (ref 0.0–5.0)
HCT: 36.3 % (ref 36.0–46.0)
HEMOGLOBIN: 11.9 g/dL — AB (ref 12.0–15.0)
LYMPHS PCT: 33.7 % (ref 12.0–46.0)
Lymphs Abs: 3 10*3/uL (ref 0.7–4.0)
MCHC: 32.8 g/dL (ref 30.0–36.0)
MCV: 66 fl — ABNORMAL LOW (ref 78.0–100.0)
Monocytes Absolute: 0.4 10*3/uL (ref 0.1–1.0)
Monocytes Relative: 4.7 % (ref 3.0–12.0)
Neutro Abs: 5.4 10*3/uL (ref 1.4–7.7)
Neutrophils Relative %: 60.2 % (ref 43.0–77.0)
Platelets: 293 10*3/uL (ref 150.0–400.0)
RBC: 5.5 Mil/uL — AB (ref 3.87–5.11)
RDW: 18.6 % — AB (ref 11.5–15.5)
WBC: 8.9 10*3/uL (ref 4.0–10.5)

## 2018-12-25 LAB — LIPID PANEL
CHOL/HDL RATIO: 3
Cholesterol: 261 mg/dL — ABNORMAL HIGH (ref 0–200)
HDL: 81.2 mg/dL (ref >39.00)
LDL CALC: 161 mg/dL — AB (ref 0–99)
NonHDL: 180.12
Triglycerides: 98 mg/dL (ref 0.0–149.0)
VLDL: 19.6 mg/dL (ref 0.0–40.0)

## 2018-12-25 LAB — T3, FREE: T3 FREE: 2.6 pg/mL (ref 2.3–4.2)

## 2018-12-25 LAB — TSH: TSH: 7.1 u[IU]/mL — ABNORMAL HIGH (ref 0.35–4.50)

## 2018-12-25 LAB — T4, FREE: FREE T4: 0.71 ng/dL (ref 0.60–1.60)

## 2018-12-25 MED ORDER — HYDRALAZINE HCL 50 MG PO TABS: ORAL_TABLET | ORAL | 1 refills | Status: DC

## 2018-12-25 NOTE — Patient Instructions (Addendum)
Bring a copy of your living will and/or healthcare power of attorney to your next office visit.  Do pick up wrist BP machine, and do nurse visit for BP recheck, bringing new machine with you.  Refer to eye Dr. resources and get eye exam asap.   Will be in touch about need for prevnar after looking through records provided from Dr. Laurance Flatten.  Will provide housekeeping and transportation resources for you, will mail.  Dr. Ethlyn Gallery to be in touch regarding restarting meloxicam once lab results come back.    Linda Lucero , Thank you for taking time to come for your Medicare Wellness Visit. I appreciate your ongoing commitment to your health goals. Please review the following plan we discussed and let me know if I can assist you in the future.   These are the goals we discussed: Goals    . Patient Stated     Less hip pain!        This is a list of the screening recommended for you and due dates:  Health Maintenance  Topic Date Due  . Tetanus Vaccine  08/31/1947  . DEXA scan (bone density measurement)  09/01/1995  . Pneumonia vaccines (1 of 2 - PCV13) 09/01/1995  . Flu Shot  Completed    Bone Density Test The bone density test uses a special type of X-ray to measure the amount of calcium and other minerals in your bones. It can measure bone density in the hip and the spine. The test procedure is similar to having a regular X-ray. This test may also be called:  Bone densitometry.  Bone mineral density test.  Dual-energy X-ray absorptiometry (DEXA). You may have this test to:  Diagnose a condition that causes weak or thin bones (osteoporosis).  Screen you for osteoporosis.  Predict your risk for a broken bone (fracture).  Determine how well your osteoporosis treatment is working. Tell a health care provider about:  Any allergies you have.  All medicines you are taking, including vitamins, herbs, eye drops, creams, and over-the-counter medicines.  Any problems you or family  members have had with anesthetic medicines.  Any blood disorders you have.  Any surgeries you have had.  Any medical conditions you have.  Whether you are pregnant or may be pregnant.  Any medical tests you have had within the past 14 days that used contrast material. What are the risks? Generally, this is a safe procedure. However, it does expose you to a small amount of radiation, which can slightly increase your cancer risk. What happens before the procedure?  Do not take any calcium supplements starting 24 hours before your test.  Remove all metal jewelry, eyeglasses, dental appliances, and any other metal objects. What happens during the procedure?   You will lie down on an exam table. There will be an X-ray generator below you and an imaging device above you.  Other devices, such as boxes or braces, may be used to position your body properly for the scan.  The machine will slowly scan your body. You will need to keep still.  The images will show up on a screen in the room. Images will be examined by a specialist after your test is done. The procedure may vary among health care providers and hospitals. What happens after the procedure?  It is up to you to get your test results. Ask your health care provider, or the department that is doing the test, when your results will be ready. Summary  A  bone density test is an imaging test that uses a type of X-ray to measure the amount of calcium and other minerals in your bones.  The test may be used to diagnose or screen you for a condition that causes weak or thin bones (osteoporosis), predict your risk for a broken bone (fracture), or determine how well your osteoporosis treatment is working.  Do not take any calcium supplements starting 24 hours before your test.  Ask your health care provider, or the department that is doing the test, when your results will be ready. This information is not intended to replace advice given  to you by your health care provider. Make sure you discuss any questions you have with your health care provider. Document Released: 11/14/2004 Document Revised: 08/27/2017 Document Reviewed: 08/27/2017 Elsevier Interactive Patient Education  2019 [place] Prevention in the Home, Adult Falls can cause injuries. They can happen to people of all ages. There are many things you can do to make your home safe and to help prevent falls. Ask for help when making these changes, if needed. What actions can I take to prevent falls? General Instructions  Use good lighting in all rooms. Replace any light bulbs that burn out.  Turn on the lights when you go into a dark area. Use night-lights.  Keep items that you use often in easy-to-reach places. Lower the shelves around your home if necessary.  Set up your furniture so you have a clear path. Avoid moving your furniture around.  Do not have throw rugs and other things on the floor that can make you trip.  Avoid walking on wet floors.  If any of your floors are uneven, fix them.  Add color or contrast paint or tape to clearly mark and help you see: ? Any grab bars or handrails. ? First and last steps of stairways. ? Where the edge of each step is.  If you use a stepladder: ? Make sure that it is fully opened. Do not climb a closed stepladder. ? Make sure that both sides of the stepladder are locked into place. ? Ask someone to hold the stepladder for you while you use it.  If there are any pets around you, be aware of where they are. What can I do in the bathroom?      Keep the floor dry. Clean up any water that spills onto the floor as soon as it happens.  Remove soap buildup in the tub or shower regularly.  Use non-skid mats or decals on the floor of the tub or shower.  Attach bath mats securely with double-sided, non-slip rug tape.  If you need to sit down in the shower, use a plastic, non-slip stool.  Install  grab bars by the toilet and in the tub and shower. Do not use towel bars as grab bars. What can I do in the bedroom?  Make sure that you have a light by your bed that is easy to reach.  Do not use any sheets or blankets that are too big for your bed. They should not hang down onto the floor.  Have a firm chair that has side arms. You can use this for support while you get dressed. What can I do in the kitchen?  Clean up any spills right away.  If you need to reach something above you, use a strong step stool that has a grab bar.  Keep electrical cords out of the way.  Do not  use floor polish or wax that makes floors slippery. If you must use wax, use non-skid floor wax. What can I do with my stairs?  Do not leave any items on the stairs.  Make sure that you have a light switch at the top of the stairs and the bottom of the stairs. If you do not have them, ask someone to add them for you.  Make sure that there are handrails on both sides of the stairs, and use them. Fix handrails that are broken or loose. Make sure that handrails are as long as the stairways.  Install non-slip stair treads on all stairs in your home.  Avoid having throw rugs at the top or bottom of the stairs. If you do have throw rugs, attach them to the floor with carpet tape.  Choose a carpet that does not hide the edge of the steps on the stairway.  Check any carpeting to make sure that it is firmly attached to the stairs. Fix any carpet that is loose or worn. What can I do on the outside of my home?  Use bright outdoor lighting.  Regularly fix the edges of walkways and driveways and fix any cracks.  Remove anything that might make you trip as you walk through a door, such as a raised step or threshold.  Trim any bushes or trees on the path to your home.  Regularly check to see if handrails are loose or broken. Make sure that both sides of any steps have handrails.  Install guardrails along the edges of  any raised decks and porches.  Clear walking paths of anything that might make someone trip, such as tools or rocks.  Have any leaves, snow, or ice cleared regularly.  Use sand or salt on walking paths during winter.  Clean up any spills in your garage right away. This includes grease or oil spills. What other actions can I take?  Wear shoes that: ? Have a low heel. Do not wear high heels. ? Have rubber bottoms. ? Are comfortable and fit you well. ? Are closed at the toe. Do not wear open-toe sandals.  Use tools that help you move around (mobility aids) if they are needed. These include: ? Canes. ? Walkers. ? Scooters. ? Crutches.  Review your medicines with your doctor. Some medicines can make you feel dizzy. This can increase your chance of falling. Ask your doctor what other things you can do to help prevent falls. Where to find more information  Centers for Disease Control and Prevention, [facility]AD[employer]: https://garcia.biz/  [employer] on Aging: BrainJudge.co.uk Contact a doctor if:  You are afraid of falling at home.  You feel weak, drowsy, or dizzy at home.  You fall at home. Summary  There are many simple things that you can do to make your home safe and to help prevent falls.  Ways to make your home safe include removing tripping hazards and installing grab bars in the bathroom.  Ask for help when making these changes in your home. This information is not intended to replace advice given to you by your health care provider. Make sure you discuss any questions you have with your health care provider. Document Released: 08/19/2009 Document Revised: 06/07/2017 Document Reviewed: 06/07/2017 Elsevier Interactive Patient Education  2019 [place] Maintenance, Female Adopting a healthy lifestyle and getting preventive care can go a long way to promote health and wellness. Talk with your health care provider about what schedule of regular  examinations is right for you. This is a good chance for you to check in with your provider about disease prevention and staying healthy. In between checkups, there are plenty of things you can do on your own. Experts have done a lot of research about which lifestyle changes and preventive measures are most likely to keep you healthy. Ask your health care provider for more information. Weight and diet Eat a healthy diet  Be sure to include plenty of vegetables, fruits, low-fat dairy products, and lean protein.  Do not eat a lot of foods high in solid fats, added sugars, or salt.  Get regular exercise. This is one of the most important things you can do for your health. ? Most adults should exercise for at least 150 minutes each week. The exercise should increase your heart rate and make you sweat (moderate-intensity exercise). ? Most adults should also do strengthening exercises at least twice a week. This is in addition to the moderate-intensity exercise. Maintain a healthy weight  Body mass index (BMI) is a measurement that can be used to identify possible weight problems. It estimates body fat based on height and weight. Your health care provider can help determine your BMI and help you achieve or maintain a healthy weight.  For females 65 years of age and older: ? A BMI below 18.5 is considered underweight. ? A BMI of 18.5 to 24.9 is normal. ? A BMI of 25 to 29.9 is considered overweight. ? A BMI of 30 and above is considered obese. Watch levels of cholesterol and blood lipids  You should start having your blood tested for lipids and cholesterol at 83 years of age, then have this test every 5 years.  You may need to have your cholesterol levels checked more often if: ? Your lipid or cholesterol levels are high. ? You are older than 83 years of age. ? You are at high risk for heart disease. Cancer screening Lung Cancer  Lung cancer screening is recommended for adults 10-80 years  old who are at high risk for lung cancer because of a history of smoking.  A yearly low-dose CT scan of the lungs is recommended for people who: ? Currently smoke. ? Have quit within the past 15 years. ? Have at least a 30-pack-year history of smoking. A pack year is smoking an average of one pack of cigarettes a day for 1 year.  Yearly screening should continue until it has been 15 years since you quit.  Yearly screening should stop if you develop a health problem that would prevent you from having lung cancer treatment. Breast Cancer  Practice breast self-awareness. This means understanding how your breasts normally appear and feel.  It also means doing regular breast self-exams. Let your health care provider know about any changes, no matter how small.  If you are in your 20s or 30s, you should have a clinical breast exam (CBE) by a health care provider every 1-3 years as part of a regular health exam.  If you are 66 or older, have a CBE every year. Also consider having a breast X-ray (mammogram) every year.  If you have a family history of breast cancer, talk to your health care provider about genetic screening.  If you are at high risk for breast cancer, talk to your health care provider about having an MRI and a mammogram every year.  Breast cancer gene (BRCA) assessment is recommended for women who have family members with BRCA-related cancers. BRCA-related  cancers include: ? Breast. ? Ovarian. ? Tubal. ? Peritoneal cancers.  Results of the assessment will determine the need for genetic counseling and BRCA1 and BRCA2 testing. Cervical Cancer Your health care provider may recommend that you be screened regularly for cancer of the pelvic organs (ovaries, uterus, and vagina). This screening involves a pelvic examination, including checking for microscopic changes to the surface of your cervix (Pap test). You may be encouraged to have this screening done every 3 years, beginning at  age 59.  For women ages 80-65, health care providers may recommend pelvic exams and Pap testing every 3 years, or they may recommend the Pap and pelvic exam, combined with testing for human papilloma virus (HPV), every 5 years. Some types of HPV increase your risk of cervical cancer. Testing for HPV may also be done on women of any age with unclear Pap test results.  Other health care providers may not recommend any screening for nonpregnant women who are considered low risk for pelvic cancer and who do not have symptoms. Ask your health care provider if a screening pelvic exam is right for you.  If you have had past treatment for cervical cancer or a condition that could lead to cancer, you need Pap tests and screening for cancer for at least 20 years after your treatment. If Pap tests have been discontinued, your risk factors (such as having a new sexual partner) need to be reassessed to determine if screening should resume. Some women have medical problems that increase the chance of getting cervical cancer. In these cases, your health care provider may recommend more frequent screening and Pap tests. Colorectal Cancer  This type of cancer can be detected and often prevented.  Routine colorectal cancer screening usually begins at 83 years of age and continues through 83 years of age.  Your health care provider may recommend screening at an earlier age if you have risk factors for colon cancer.  Your health care provider may also recommend using home test kits to check for hidden blood in the stool.  A small camera at the end of a tube can be used to examine your colon directly (sigmoidoscopy or colonoscopy). This is done to check for the earliest forms of colorectal cancer.  Routine screening usually begins at age 30.  Direct examination of the colon should be repeated every 5-10 years through 83 years of age. However, you may need to be screened more often if early forms of precancerous  polyps or small growths are found. Skin Cancer  Check your skin from head to toe regularly.  Tell your health care provider about any new moles or changes in moles, especially if there is a change in a mole's shape or color.  Also tell your health care provider if you have a mole that is larger than the size of a pencil eraser.  Always use sunscreen. Apply sunscreen liberally and repeatedly throughout the day.  Protect yourself by wearing long sleeves, pants, a wide-brimmed hat, and sunglasses whenever you are outside. Heart disease, diabetes, and high blood pressure  High blood pressure causes heart disease and increases the risk of stroke. High blood pressure is more likely to develop in: ? People who have blood pressure in the high end of the normal range (130-139/85-89 mm Hg). ? People who are overweight or obese. ? People who are African American.  If you are 92-106 years of age, have your blood pressure checked every 3-5 years. If you are 40 years  of age or older, have your blood pressure checked every year. You should have your blood pressure measured twice-once when you are at a hospital or clinic, and once when you are not at a hospital or clinic. Record the average of the two measurements. To check your blood pressure when you are not at a hospital or clinic, you can use: ? An automated blood pressure machine at a pharmacy. ? A home blood pressure monitor.  If you are between 53 years and 38 years old, ask your health care provider if you should take aspirin to prevent strokes.  Have regular diabetes screenings. This involves taking a blood sample to check your fasting blood sugar level. ? If you are at a normal weight and have a low risk for diabetes, have this test once every three years after 83 years of age. ? If you are overweight and have a high risk for diabetes, consider being tested at a younger age or more often. Preventing infection Hepatitis B  If you have a higher  risk for hepatitis B, you should be screened for this virus. You are considered at high risk for hepatitis B if: ? You were born in a country where hepatitis B is common. Ask your health care provider which countries are considered high risk. ? Your parents were born in a high-risk country, and you have not been immunized against hepatitis B (hepatitis B vaccine). ? You have HIV or AIDS. ? You use needles to inject street drugs. ? You live with someone who has hepatitis B. ? You have had sex with someone who has hepatitis B. ? You get hemodialysis treatment. ? You take certain medicines for conditions, including cancer, organ transplantation, and autoimmune conditions. Hepatitis C  Blood testing is recommended for: ? Everyone born from [birth year] through 1963. ? Anyone with known risk factors for hepatitis C. Sexually transmitted infections (STIs)  You should be screened for sexually transmitted infections (STIs) including gonorrhea and chlamydia if: ? You are sexually active and are younger than 83 years of age. ? You are older than 83 years of age and your health care provider tells you that you are at risk for this type of infection. ? Your sexual activity has changed since you were last screened and you are at an increased risk for chlamydia or gonorrhea. Ask your health care provider if you are at risk.  If you do not have HIV, but are at risk, it may be recommended that you take a prescription medicine daily to prevent HIV infection. This is called pre-exposure prophylaxis (PrEP). You are considered at risk if: ? You are sexually active and do not regularly use condoms or know the HIV status of your partner(s). ? You take drugs by injection. ? You are sexually active with a partner who has HIV. Talk with your health care provider about whether you are at high risk of being infected with HIV. If you choose to begin PrEP, you should first be tested for HIV. You should then be tested every 3  months for as long as you are taking PrEP. Pregnancy  If you are premenopausal and you may become pregnant, ask your health care provider about preconception counseling.  If you may become pregnant, take 400 to 800 micrograms (mcg) of folic acid every day.  If you want to prevent pregnancy, talk to your health care provider about birth control (contraception). Osteoporosis and menopause  Osteoporosis is a disease in which the bones lose minerals  and strength with aging. This can result in serious bone fractures. Your risk for osteoporosis can be identified using a bone density scan.  If you are 75 years of age or older, or if you are at risk for osteoporosis and fractures, ask your health care provider if you should be screened.  Ask your health care provider whether you should take a calcium or vitamin D supplement to lower your risk for osteoporosis.  Menopause may have certain physical symptoms and risks.  Hormone replacement therapy may reduce some of these symptoms and risks. Talk to your health care provider about whether hormone replacement therapy is right for you. Follow these instructions at home:  Schedule regular health, dental, and eye exams.  Stay current with your immunizations.  Do not use any tobacco products including cigarettes, chewing tobacco, or electronic cigarettes.  If you are pregnant, do not drink alcohol.  If you are breastfeeding, limit how much and how often you drink alcohol.  Limit alcohol intake to no more than 1 drink per day for nonpregnant women. One drink equals 12 ounces of beer, 5 ounces of wine, or 1 ounces of hard liquor.  Do not use street drugs.  Do not share needles.  Ask your health care provider for help if you need support or information about quitting drugs.  Tell your health care provider if you often feel depressed.  Tell your health care provider if you have ever been abused or do not feel safe at home. This information is  not intended to replace advice given to you by your health care provider. Make sure you discuss any questions you have with your health care provider. Document Released: 05/08/2011 Document Revised: 03/30/2016 Document Reviewed: 07/27/2015 Elsevier Interactive Patient Education  2019 [employer].

## 2018-12-25 NOTE — Patient Instructions (Signed)
Please start checking blood pressures at home and record these. Let me know how they are looking when we call you with bloodwork.

## 2018-12-25 NOTE — Progress Notes (Signed)
Linda Lucero DOB: 10/17/1928 Encounter date: 12/25/2018  This is a 85 y.o. female who presents for complete physical   History of present illness/Additional concerns: Left hip and arthritis are bothering her a little more today. meloxicam was really helping her but she was advised to stop this due to the kidney strain. Has hard time keeping up with things around the house/cleaning/etc due to this. Left hip is the worst. Other joints are tolerable.   Took bp medication this morning - amlodipine; didn't take losartan and didn't take the thyroid. For past month has had terrible chills and not been able to sleep. Feels that chills are worse when she is taking the levothyroxine. Stopped this about 3 weeks ago. Chills have not completely gone away but are better. Only takes the hydralazine once daily. Doesn't check blood pressures at home because wrist monitor went out. Plans on getting new cuff. When she checked regularly back over a year ago would get around 150/80.   Had pneumonia shots at [facility]. Had DEXA at [facility]. Not sure about results of DEXA.   Never has chest pain. Has occasional early beat. Did follow with cardiology in past for blood pressures being elevated. Dr. Elsie Lincoln. Didn't have stress test but did have EKG at that time.   Past Medical History:  Diagnosis Date  . Coronary artery disease   . Hyperlipidemia   . Hypertension   . Renal artery stenosis (HCC)   . Sleep apnea   . Subclavian arterial stenosis (HCC)   . Systolic murmur   . Thyroid disease   . Ventricular hypertrophy    Past Surgical History:  Procedure Laterality Date  . CHOLECYSTECTOMY     Allergies  Allergen Reactions  . Diovan [Valsartan]   . Doxazosin    Current Meds  Medication Sig  . amLODipine (NORVASC) 10 MG tablet TAKE 1 TABLET BY MOUTH TWICE A DAY  . aspirin EC 325 MG tablet Take 1 tablet (325 mg total) by mouth daily.  . cholecalciferol (VITAMIN D) 1000 units tablet Take 1 tablet (1,000 Units  total) by mouth daily.  . hydrALAZINE (APRESOLINE) 50 MG tablet TAKE 1 TABLET BY MOUTH 3 TIMES A DAY WITH FOOD  . ipratropium (ATROVENT) 0.03 % nasal spray Place 2 sprays into both nostrils every 12 (twelve) hours.  Marland Kitchen levothyroxine (SYNTHROID, LEVOTHROID) 150 MCG tablet Take 150 mcg by mouth daily.  Marland Kitchen losartan-hydrochlorothiazide (HYZAAR) 50-12.5 MG tablet Take 1 tablet by mouth daily.  . Magnesium Oxide 250 MG TABS Take by mouth.  . [DISCONTINUED] hydrALAZINE (APRESOLINE) 50 MG tablet TAKE 1 TABLET BY MOUTH 3 TIMES A DAY WITH FOOD   Social History   Tobacco Use  . Smoking status: Former Smoker    Types: Cigarettes  . Smokeless tobacco: Never Used  Substance Use Topics  . Alcohol use: Not on file   Family History  Problem Relation Age of Onset  . Heart disease Mother   . High blood pressure Mother   . Lung cancer Father   . Diabetes Maternal Grandmother   . Heart disease Maternal Grandmother      Review of Systems  Constitutional: Negative for chills, fatigue and fever.  HENT: Positive for hearing loss (slight; doesn't bother her much). Negative for congestion and ear pain.   Eyes:       Has been multiple years since last eye exam. Feels that vision is worsening and has decrease vision in periphery   Respiratory: Negative for cough, chest tightness, shortness of  breath and wheezing.   Cardiovascular: Positive for palpitations. Negative for chest pain and leg swelling.       Notes increased HR on occasion. Doesn't last long; just notes and it is gone. Noted when she was anxious upon arriving to doctors office. Sometimes will note momentarily when exerting self. Never feels short of breath or chest discomfort with this.  Gastrointestinal: Negative for abdominal pain, blood in stool, constipation (eating apples on regular basis has helped prevent constipatoin), diarrhea, nausea and vomiting.  Genitourinary: Negative for difficulty urinating and dysuria.  Musculoskeletal: Positive  for arthralgias (see HPI).  Skin: Negative for color change and rash.  Neurological: Negative for dizziness, weakness and light-headedness.  Psychiatric/Behavioral: Nervous/anxious: just anxious at doc office; when child there was concern for needing amputation and she has feared doctros since then.     CBC:  Lab Results  Component Value Date   WBC 8.9 12/25/2018   HGB 11.9 (L) 12/25/2018   HCT 36.3 12/25/2018   MCH 21.5 (L) 08/12/2017   MCHC 32.8 12/25/2018   RDW 18.6 (H) 12/25/2018   PLT 293.0 12/25/2018   CMP: Lab Results  Component Value Date   NA 139 12/25/2018   K 3.9 12/25/2018   CL 103 12/25/2018   CO2 25 12/25/2018   ANIONGAP 9 08/12/2017   GLUCOSE 95 12/25/2018   BUN 24 (H) 12/25/2018   CREATININE 1.34 (H) 12/25/2018   GFRAA 39 (L) 08/12/2017   CALCIUM 10.1 12/25/2018   PROT 7.7 12/25/2018   BILITOT 0.4 12/25/2018   ALKPHOS 86 12/25/2018   ALT 8 12/25/2018   AST 20 12/25/2018   LIPID: Lab Results  Component Value Date   CHOL 261 (H) 12/25/2018   TRIG 98.0 12/25/2018   HDL 81.20 12/25/2018   LDLCALC 161 (H) 12/25/2018    Objective:  BP (!) 192/58 (BP Location: Left Arm, Patient Position: Sitting, Cuff Size: Normal)   Pulse 95   Temp 98.2 F (36.8 C) (Oral)   Ht 5\' 5"  (1.651 m)   Wt 169 lb 6.4 oz (76.8 kg)   SpO2 100%   BMI 28.19 kg/m   Weight: 169 lb 6.4 oz (76.8 kg)   BP Readings from Last 3 Encounters:  12/25/18 (!) 192/58  09/04/18 (!) 174/64  08/12/17 (!) 152/62   Wt Readings from Last 3 Encounters:  12/25/18 169 lb 6.4 oz (76.8 kg)  09/04/18 178 lb 8 oz (81 kg)    Physical Exam Constitutional:      General: She is not in acute distress.    Appearance: She is well-developed.  HENT:     Right Ear: Tympanic membrane, ear canal and external ear normal.     Left Ear: Tympanic membrane, ear canal and external ear normal.     Mouth/Throat:     Mouth: Mucous membranes are moist.  Eyes:     Extraocular Movements: Extraocular movements  intact.     Conjunctiva/sclera: Conjunctivae normal.     Pupils: Pupils are equal, round, and reactive to light.  Cardiovascular:     Rate and Rhythm: Normal rate and regular rhythm.     Heart sounds: Normal heart sounds. No murmur. No friction rub.  Pulmonary:     Effort: Pulmonary effort is normal. No respiratory distress.     Breath sounds: Normal breath sounds. No wheezing or rales.  Abdominal:     General: Abdomen is flat. Bowel sounds are normal. There is no distension.     Tenderness: There is no abdominal  tenderness. There is no guarding.  Musculoskeletal:     Right lower leg: No edema.     Left lower leg: No edema.  Neurological:     Mental Status: She is alert and oriented to person, place, and time.  Psychiatric:        Behavior: Behavior normal.     Assessment/Plan: Health Maintenance Due  Topic Date Due  . DEXA SCAN  09/01/1995  . PNA vac Low Risk Adult (2 of 2 - PPSV23) 10/02/2017   Health Maintenance reviewed - uncertain of last year of DEXA. Had with previous physician.  1. History of anemia Will recheck bloodwork for stability today.  2. Postablative hypothyroidism She has not been taking medication x 3 weeks due to concerns with chills from medication. We will start with bloodwork and make suggestion pending these results.  - TSH; Future - T4, free; Future - T3, free - T4, free - TSH  3. Hypertension, unspecified type Her blood pressure is always higher in the doctors office. It was similar on recheck. She is going to get home cuff and check there. She has had significantly lower diastolic pressures in past and I have concerns with treatment causing hypotension so would like to see baseline at home before adjusting treatment. I did print rx for hydralazine today because this medication has been too expensive for her so she has not been taking this as prescribed and missing 2 of her daily doses. We discussed places for her to check for less expensive rx  (like [facility]) and she will look into this.  - CBC with Differential/Platelet; Future - Comprehensive metabolic panel; Future - hydrALAZINE (APRESOLINE) 50 MG tablet; TAKE 1 TABLET BY MOUTH 3 TIMES A DAY WITH FOOD  Dispense: 90 tablet; Refill: 1 - Comprehensive metabolic panel - CBC with Differential/Platelet  4. Chronic kidney disease (CKD), stage III (moderate) (HCC) Has been stable. If numbers are still stable would consider letting her restart meloxicam and monitoring as this was very helpful for arthritis and she gets no benefit with tylenol.   5. Arthritis of left hip See above. Was better controlled when able to take meloxicam.  6. PVCs EKG completed today due to audible PVC on exam. Patient is asymptomatic except occasional early beat she feels. We have requested previous cardiology records (patient states eval was in last 2 years) and we will review these. I do not feel she needs to see cardiology at this point unless more symptomatic. - EKG 12-Lead  7. Hyperlipidemia, unspecified hyperlipidemia type - Lipid panel; Future - Lipid panel  8. Screening for osteoporosis Will review records to see if we can locate previous DEXA result.  9. Decreased vision - Ambulatory referral to Ophthalmology  Return pending blood pressure report.  Theodis Shove, MD

## 2018-12-26 ENCOUNTER — Telehealth: Payer: Self-pay

## 2018-12-26 NOTE — Telephone Encounter (Signed)
Information s/p awv discussion mailed to pt.

## 2019-01-04 ENCOUNTER — Other Ambulatory Visit: Payer: Self-pay | Admitting: Nurse Practitioner

## 2019-01-06 ENCOUNTER — Telehealth: Payer: Self-pay

## 2019-01-06 DIAGNOSIS — H547 Unspecified visual loss: Secondary | ICD-10-CM

## 2019-01-06 NOTE — Telephone Encounter (Signed)
Copied from CRM [id number]. Topic: General - Inquiry >> Jan 06, 2019 12:56 PM Lynne Logan D wrote: Reason for CRM: Pt called and stated she had an appointment scheduled with Dr. Nile Riggs for an eye exam. He was one of the choices of doctors Irving Burton gave her to see. Pt stated the office needed authorization in order to see her. She would like for Irving Burton to give her a call and make sure that Dr. Nile Riggs is an acceptable choice. Please advise.

## 2019-01-08 NOTE — Telephone Encounter (Signed)
Linda Lucero, are you aware of which Dr. Nile Riggs the patient would like to see?

## 2019-01-08 NOTE — Telephone Encounter (Signed)
I would think she would just need referral. OK to place this for ophthalmology. Just please clarify which Dr. Nile Riggs as I think there are more than one?  Let patient know and please cc Irving Burton back as fyi.

## 2019-01-08 NOTE — Telephone Encounter (Signed)
Author placed ophthalmology referral, but Dr. Nile Riggs not listed as 'preferred provider' apparently. Author placed generic referral in meantime, and phoned pt. to notify, but no answer. Author left generic VM asking for return call. OK for P[facility] to relay above message.

## 2019-01-08 NOTE — Telephone Encounter (Signed)
Noted  

## 2019-01-09 ENCOUNTER — Telehealth: Payer: Self-pay

## 2019-01-09 NOTE — Telephone Encounter (Signed)
Copied from CRM [id number]. Topic: General - Inquiry >> Jan 08, 2019  5:12 PM Marylen Ponto wrote: Pt stated she would like to see Dr. Christia Reading at [facility]. Pt stated she has an appt with Dr. Nile Riggs scheduled for 01/13/19 at 3 pm.

## 2019-01-10 NOTE — Telephone Encounter (Signed)
Referral already placed.

## 2019-01-16 ENCOUNTER — Telehealth: Payer: Self-pay | Admitting: Family Medicine

## 2019-01-16 NOTE — Telephone Encounter (Signed)
Pt calling office to receive results after receiving a letter to call the office. Pt given results per notes of Dr. Hassan Rowan on 12/27/18. Pt verbalized understanding.Unable to document in result note due to result note. Pt agreeable to start on Levothyroxine 50 mcg and would like for the prescription to be sent [facility] [address]. Pt scheduled for lab appt on 02/27/19 and will need lab orders for BMP and TSH to be placed.

## 2019-01-17 MED ORDER — LEVOTHYROXINE SODIUM 50 MCG PO TABS: 50.0000 ug | ORAL_TABLET | Freq: Every day | ORAL | 0 refills | Status: DC

## 2019-01-17 NOTE — Telephone Encounter (Signed)
See if patient got that ? Not sure why that was sent in for her. Please recheck lab message and message below so follow up lab orders are properly ordered. Let me know if she has started the and when. If she has not started the then please send just dose with plans to recheck tsh as documented below

## 2019-01-17 NOTE — Telephone Encounter (Signed)
Spoke with patient. She stated that she started the but was having chills so she stopped taking it. The has been sent in.

## 2019-01-17 NOTE — Telephone Encounter (Signed)
Levothyroxine 150 was sent in on 01/06/2019 by another provider. Please advise.

## 2019-01-17 NOTE — Addendum Note (Signed)
Addended by: Solon Augusta on: 01/17/2019 01:52 PM   Modules accepted: Orders

## 2019-01-31 IMAGING — DX DG CHEST 2V
2 series · 2 of 2 positions shown · non-contrast
Comparison: None.

CLINICAL DATA: Pt c/o of sob, throbbing heart, AND nausea since
last night. Pt denies pain AND dizziness. Non smoker

EXAM:
CHEST  2 VIEW

[chest lat]
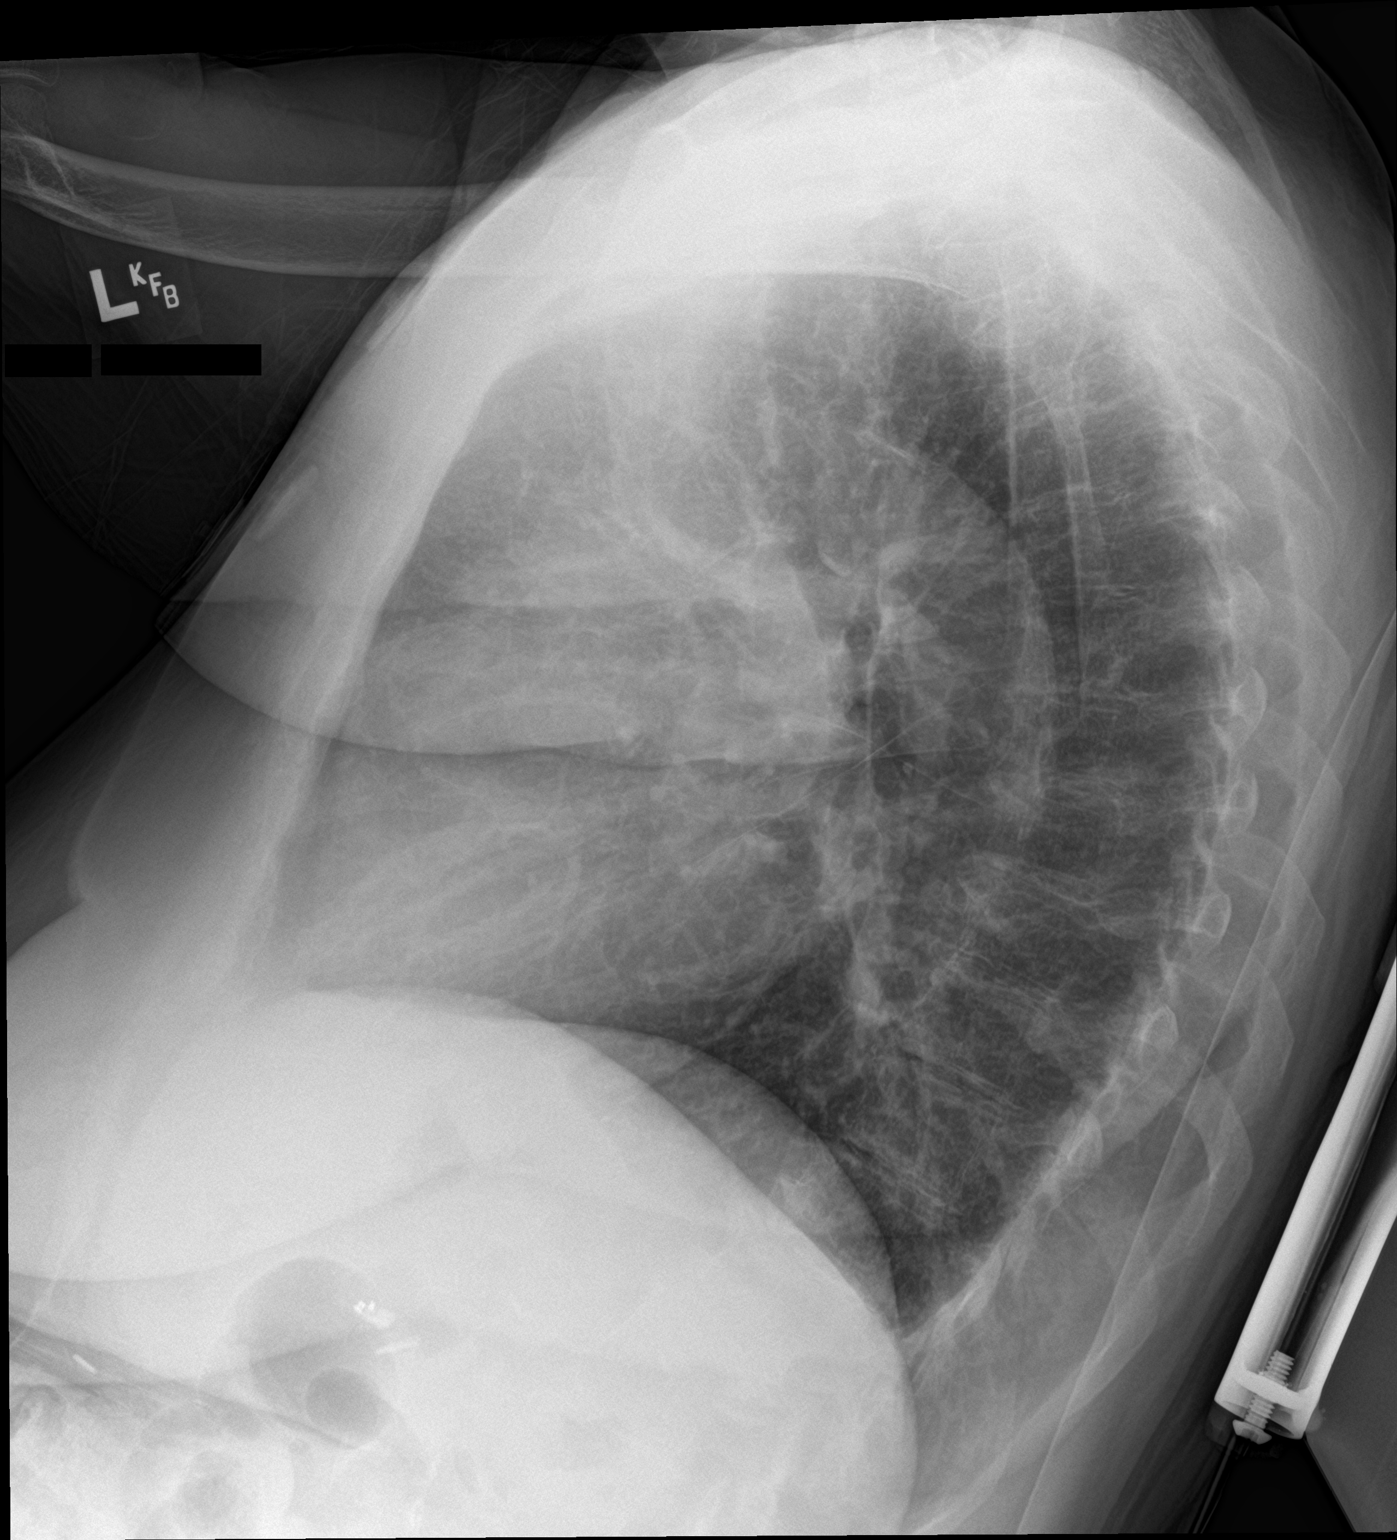

[chest ap]
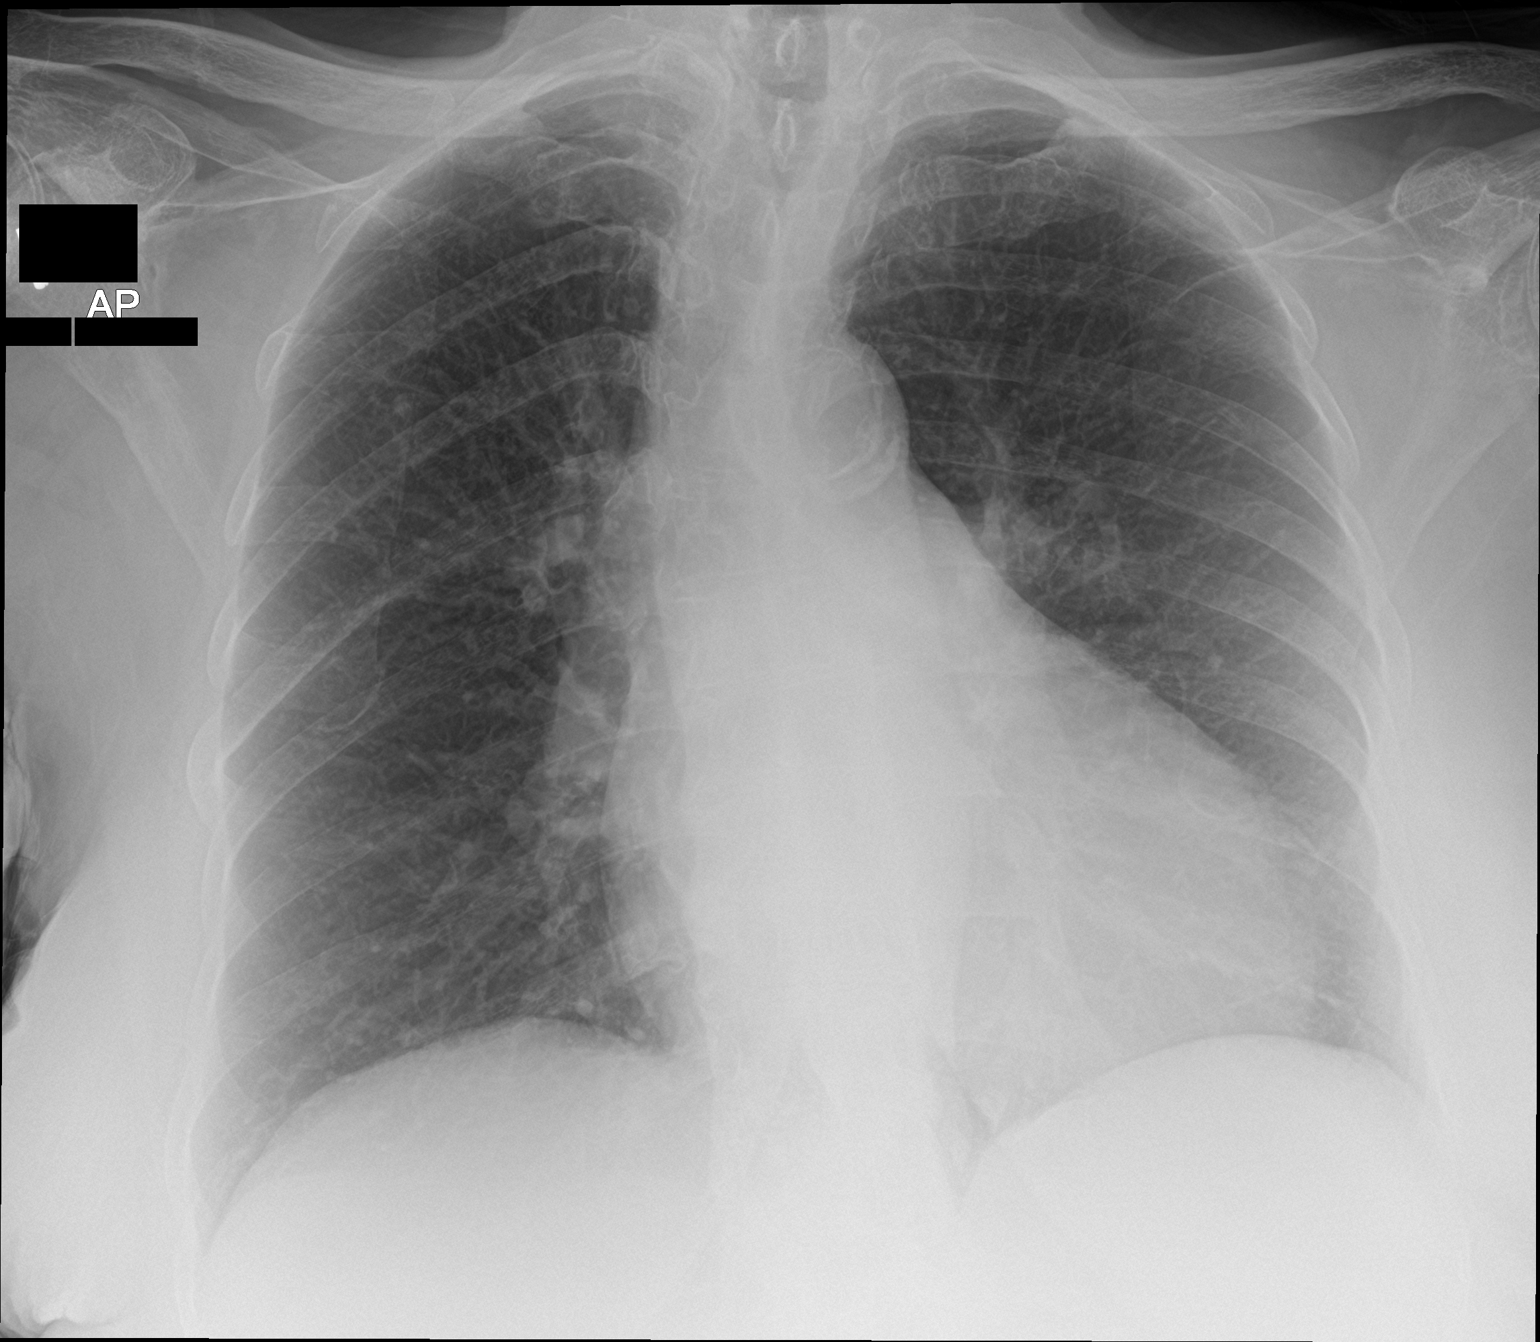

[2 of 2 positions shown; findings below may reference images not displayed]

FINDINGS: The cardiac silhouette is mildly enlarged. No mediastinal or hilar
masses. No convincing adenopathy.

Lungs are hyperexpanded but clear.

No pleural effusion or pneumothorax. The skeletal structures are
demineralized but intact.
IMPRESSION: No acute cardiopulmonary disease.

## 2019-02-27 ENCOUNTER — Other Ambulatory Visit: Payer: Medicare HMO

## 2019-03-05 ENCOUNTER — Other Ambulatory Visit: Payer: Medicare HMO

## 2019-03-10 ENCOUNTER — Other Ambulatory Visit: Payer: Self-pay | Admitting: Nurse Practitioner

## 2019-03-10 DIAGNOSIS — I10 Essential (primary) hypertension: Secondary | ICD-10-CM

## 2019-03-11 ENCOUNTER — Other Ambulatory Visit: Payer: Self-pay | Admitting: Family Medicine

## 2019-03-11 DIAGNOSIS — I10 Essential (primary) hypertension: Secondary | ICD-10-CM

## 2019-04-01 ENCOUNTER — Telehealth: Payer: Self-pay | Admitting: *Deleted

## 2019-04-01 NOTE — Telephone Encounter (Signed)
Copied from CRM [id number]. Topic: Appointment Scheduling - Scheduling Inquiry for Clinic >> Mar 28, 2019  4:06 PM Reggie Pile, [place] wrote: Reason for CRM: Patient calling in stating she would like to reschedule her appointment for Tuesday. Call back is [phone].

## 2019-04-01 NOTE — Telephone Encounter (Signed)
I called the pt and rescheduled the lab appt for 7/14 per the pts request.

## 2019-04-08 ENCOUNTER — Other Ambulatory Visit: Payer: Self-pay | Admitting: Family Medicine

## 2019-04-08 ENCOUNTER — Other Ambulatory Visit: Payer: Medicare HMO

## 2019-04-08 DIAGNOSIS — I10 Essential (primary) hypertension: Secondary | ICD-10-CM

## 2019-05-02 ENCOUNTER — Other Ambulatory Visit: Payer: Medicare HMO

## 2019-05-20 ENCOUNTER — Other Ambulatory Visit: Payer: Medicare HMO

## 2019-06-02 ENCOUNTER — Other Ambulatory Visit: Payer: Self-pay | Admitting: Internal Medicine

## 2019-06-04 DIAGNOSIS — H43812 Vitreous degeneration, left eye: Secondary | ICD-10-CM | POA: Diagnosis not present

## 2019-06-04 DIAGNOSIS — H2513 Age-related nuclear cataract, bilateral: Secondary | ICD-10-CM | POA: Diagnosis not present

## 2019-06-04 DIAGNOSIS — H25042 Posterior subcapsular polar age-related cataract, left eye: Secondary | ICD-10-CM | POA: Diagnosis not present

## 2019-06-04 DIAGNOSIS — H25013 Cortical age-related cataract, bilateral: Secondary | ICD-10-CM | POA: Diagnosis not present

## 2019-06-16 ENCOUNTER — Other Ambulatory Visit: Payer: Self-pay | Admitting: Family Medicine

## 2019-06-20 ENCOUNTER — Other Ambulatory Visit: Payer: Self-pay | Admitting: *Deleted

## 2019-06-20 DIAGNOSIS — I10 Essential (primary) hypertension: Secondary | ICD-10-CM

## 2019-06-20 MED ORDER — HYDRALAZINE HCL 50 MG PO TABS: ORAL_TABLET | ORAL | 1 refills | Status: DC

## 2019-06-20 NOTE — Telephone Encounter (Signed)
Rx done. 

## 2019-06-21 ENCOUNTER — Other Ambulatory Visit: Payer: Self-pay | Admitting: Internal Medicine

## 2019-07-08 ENCOUNTER — Other Ambulatory Visit (INDEPENDENT_AMBULATORY_CARE_PROVIDER_SITE_OTHER): Payer: Medicare HMO

## 2019-07-08 ENCOUNTER — Other Ambulatory Visit: Payer: Self-pay

## 2019-07-08 ENCOUNTER — Other Ambulatory Visit: Payer: Self-pay | Admitting: *Deleted

## 2019-07-08 DIAGNOSIS — E890 Postprocedural hypothyroidism: Secondary | ICD-10-CM

## 2019-07-08 DIAGNOSIS — N183 Chronic kidney disease, stage 3 unspecified: Secondary | ICD-10-CM

## 2019-07-08 LAB — BASIC METABOLIC PANEL
BUN: 21 mg/dL (ref 6–23)
CO2: 24 mEq/L (ref 19–32)
Calcium: 9.6 mg/dL (ref 8.4–10.5)
Chloride: 106 mEq/L (ref 96–112)
Creatinine, Ser: 1.16 mg/dL (ref 0.40–1.20)
GFR: 53.24 mL/min — ABNORMAL LOW (ref >60.00)
Glucose, Bld: 83 mg/dL (ref 70–99)
Potassium: 4.5 mEq/L (ref 3.5–5.1)
Sodium: 139 mEq/L (ref 135–145)

## 2019-07-08 LAB — TSH: TSH: 6.3 u[IU]/mL — ABNORMAL HIGH (ref 0.35–4.50)

## 2019-07-14 ENCOUNTER — Other Ambulatory Visit: Payer: Self-pay | Admitting: Family Medicine

## 2019-07-14 DIAGNOSIS — I10 Essential (primary) hypertension: Secondary | ICD-10-CM

## 2019-07-16 ENCOUNTER — Other Ambulatory Visit: Payer: Medicare HMO

## 2019-07-29 ENCOUNTER — Encounter: Payer: Self-pay | Admitting: *Deleted

## 2019-07-30 ENCOUNTER — Telehealth: Payer: Self-pay | Admitting: Family Medicine

## 2019-07-30 NOTE — Telephone Encounter (Signed)
Pt called back in for her labs for 9/1, she stated she has been staying at hotel while her house was being worked on.   Best number is [phone]

## 2019-07-31 MED ORDER — LEVOTHYROXINE SODIUM 75 MCG PO TABS: 75.0000 ug | ORAL_TABLET | Freq: Every day | ORAL | 1 refills | Status: DC

## 2019-07-31 NOTE — Telephone Encounter (Signed)
See results note. 

## 2019-08-01 DIAGNOSIS — R69 Illness, unspecified: Secondary | ICD-10-CM | POA: Diagnosis not present

## 2019-08-26 ENCOUNTER — Other Ambulatory Visit: Payer: Self-pay | Admitting: Internal Medicine

## 2019-08-26 ENCOUNTER — Telehealth: Payer: Self-pay | Admitting: Family Medicine

## 2019-08-26 NOTE — Telephone Encounter (Signed)
Please check dosing with her? This was last filled by "[facility]" and dosing was for 10mg  BID? Which exceeds the recommended max for this medication.

## 2019-08-26 NOTE — Telephone Encounter (Signed)
I left a message with the attendant at [facility] to have the pt return my call.  CRM also created.

## 2019-08-26 NOTE — Telephone Encounter (Signed)
Pt request refill   amLODipine (NORVASC) 10 MG tablet  Dr Ethlyn Gallery has never filled this for the pt.    [id number] [place], [place] [facility] [address] [phone] (Phone) [phone] (Fax)

## 2019-09-13 ENCOUNTER — Other Ambulatory Visit: Payer: Self-pay | Admitting: Family Medicine

## 2019-09-21 ENCOUNTER — Other Ambulatory Visit: Payer: Self-pay | Admitting: Family Medicine

## 2019-09-21 DIAGNOSIS — I10 Essential (primary) hypertension: Secondary | ICD-10-CM

## 2019-10-07 ENCOUNTER — Other Ambulatory Visit: Payer: Self-pay | Admitting: Family Medicine

## 2019-10-07 DIAGNOSIS — I10 Essential (primary) hypertension: Secondary | ICD-10-CM

## 2019-10-08 NOTE — Telephone Encounter (Signed)
REQUEST FOR 90 DAY SUPPLY  Last OV 12/25/18 Last refill 09/23/19 #90/0 Next OV 11/14/19

## 2019-11-04 ENCOUNTER — Other Ambulatory Visit: Payer: Self-pay | Admitting: Family Medicine

## 2019-11-13 ENCOUNTER — Other Ambulatory Visit: Payer: Self-pay

## 2019-11-14 ENCOUNTER — Encounter: Payer: Self-pay | Admitting: Family Medicine

## 2019-11-14 ENCOUNTER — Ambulatory Visit (INDEPENDENT_AMBULATORY_CARE_PROVIDER_SITE_OTHER): Payer: Medicare HMO | Admitting: Family Medicine

## 2019-11-14 VITALS — BP 120/60 | HR 78 | Temp 97.5°F | Ht 65.0 in | Wt 166.4 lb

## 2019-11-14 DIAGNOSIS — R82998 Other abnormal findings in urine: Secondary | ICD-10-CM | POA: Diagnosis not present

## 2019-11-14 DIAGNOSIS — M25552 Pain in left hip: Secondary | ICD-10-CM

## 2019-11-14 DIAGNOSIS — N1831 Chronic kidney disease, stage 3a: Secondary | ICD-10-CM | POA: Diagnosis not present

## 2019-11-14 DIAGNOSIS — E890 Postprocedural hypothyroidism: Secondary | ICD-10-CM

## 2019-11-14 DIAGNOSIS — I10 Essential (primary) hypertension: Secondary | ICD-10-CM

## 2019-11-14 NOTE — Patient Instructions (Addendum)
Try tylenol for hip pain.  Tart cherry juice is a good natural anti-inflammatory  Let me know if this is not helping      Go to [email or web address] to find information about COVID vaccines and links to contacts for this.

## 2019-11-14 NOTE — Progress Notes (Signed)
Linda Lucero DOB: [birth year] Encounter date: 11/14/2019  This is a 86 y.o. female who presents with Chief Complaint  Patient presents with  . Follow-up  . Hip Pain    patient complains of left hip pain x2 years, no known injury    History of present illness: Last visit was February/2020.  At that time we discussed arthritis and left hip pain.  She was having a hard time keeping up with things around the house and cleaning due to this. Doesn't hurt constantly but when it bothers her it is difficult to deal with. Bothers her more often. Feels like grinding when she is walking. Worried about taking anything with kidney issue. 2 aspirin will ease the pain but not helping much. Not interested in imaging or intervention, would just like to take a medication if needed.   Urine is dark like iced tea in morning, then clear.   Hypertension: Her plan is to check at home since pressures are always higher at the doctor's office.  She is going to price check a hydralazine with a printed prescription.  She was referred to ophthalmology at her last visit: she did have this visit and was given glasses for reading.   Bone density study was ordered at her last visit. She has not yet completed.  Appetite not quite as big as it used to be.  Feels it is just related to age and some decrease in activity.  She does eat 3 meals daily and does not skip meals.    Allergies  Allergen Reactions  . Diovan [Valsartan]   . Doxazosin    Current Meds  Medication Sig  . amLODipine (NORVASC) 10 MG tablet TAKE 1 TABLET BY MOUTH TWICE A DAY  . aspirin EC 325 MG tablet Take 1 tablet (325 mg total) by mouth daily.  . cholecalciferol (VITAMIN D) 1000 units tablet Take 1 tablet (1,000 Units total) by mouth daily.  . hydrALAZINE (APRESOLINE) 50 MG tablet TAKE 1 TABLET BY MOUTH 3 TIMES A DAY WITH FOOD (Patient taking differently: 2 (two) times daily. TAKE 1 TABLET BY MOUTH 2 TIMES A DAY WITH FOOD)  . ipratropium  (ATROVENT) 0.03 % nasal spray Place 2 sprays into both nostrils every 12 (twelve) hours.  Marland Kitchen levothyroxine (SYNTHROID) 75 MCG tablet Take 1 tablet (75 mcg total) by mouth daily.  . Multiple Vitamins-Minerals (MULTIVITAMIN ADULTS PO) Take by mouth.  . [DISCONTINUED] Magnesium Oxide 250 MG TABS Take by mouth.    Review of Systems  Constitutional: Negative for chills, fatigue and fever.  Respiratory: Negative for cough, chest tightness, shortness of breath and wheezing.   Cardiovascular: Negative for chest pain, palpitations and leg swelling.    Objective:  BP 120/60 (BP Location: Left Arm, Patient Position: Sitting, Cuff Size: Large)   Pulse 78   Temp (!) 97.5 F (36.4 C) (Temporal)   Ht 5\' 5"  (1.651 m)   Wt 166 lb 6.4 oz (75.5 kg)   SpO2 98%   BMI 27.69 kg/m   Weight: 166 lb 6.4 oz (75.5 kg)   BP Readings from Last 3 Encounters:  11/14/19 120/60  12/25/18 (!) 192/58  09/04/18 (!) 174/64   Wt Readings from Last 3 Encounters:  11/14/19 166 lb 6.4 oz (75.5 kg)  12/25/18 169 lb 6.4 oz (76.8 kg)  09/04/18 178 lb 8 oz (81 kg)    Physical Exam Constitutional:      General: She is not in acute distress.    Appearance: She is well-developed.  Cardiovascular:     Rate and Rhythm: Normal rate and regular rhythm.     Heart sounds: Normal heart sounds. No murmur. No friction rub.  Pulmonary:     Effort: Pulmonary effort is normal. No respiratory distress.     Breath sounds: Normal breath sounds. No wheezing or rales.  Musculoskeletal:     Right lower leg: No edema.     Left lower leg: No edema.     Comments: She has left trochanteric bursa tenderness to palpation.  No pain with hip flexion or extension, but she does have groin pain on the left with internal rotation of the hip.  She does not have significant discomfort with external rotation of the hip.  Internal rotation is limited by pain.  Neurological:     Mental Status: She is alert and oriented to person, place, and time.   Psychiatric:        Behavior: Behavior normal.     Assessment/Plan  1. Hypertension, unspecified type Blood pressures well controlled today.  Continue current medications.  We will also monitor weight loss, although she is still at a healthy weight currently. - TSH; Future - Urinalysis; Future - CMP; Future - CBC with Differential/Platelets; Future - CBC with Differential/Platelets - CMP - Urinalysis - TSH  2. Stage 3a chronic kidney disease We will recheck blood work today.  Kidneys have been stable. - TSH; Future - Urinalysis; Future - CMP; Future - CBC with Differential/Platelets; Future - CBC with Differential/Platelets - CMP - Urinalysis - TSH  3. Postablative hypothyroidism Has been stable.  Continue current dose of Synthroid. - TSH; Future - Urinalysis; Future - CMP; Future - CBC with Differential/Platelets; Future - CBC with Differential/Platelets - CMP - Urinalysis - TSH  4. Dark urine - TSH; Future - Urinalysis; Future - CMP; Future - CBC with Differential/Platelets; Future - CBC with Differential/Platelets - CMP - Urinalysis - TSH  5. Left hip pain We discussed trial of Tylenol, which she has not yet tried.  She will let me know if this does not work and we can consider something else for the pain.  She declines any further evaluation at this time including x-rays or specialty evaluation of the hip.  We discussed the trochanteric bursa tenderness and trying to ice the hip to help with pain.  I also briefly discussed injection due to the trochanteric bursa tenderness, but I feel that she has some underlying arthritis in the hip that would not improve with a trochanteric bursa injection.  We also discussed stretches that she can do for the lateral hip pain and IT band tightness.  These were demonstrated in the office.   Return in about 6 months (around 05/13/2020) for pending blood work, Chronic condition visit. Total time with patient, exam, and  documentation greater than 40 minutes.   Theodis Shove, MD

## 2019-11-15 LAB — CBC WITH DIFFERENTIAL/PLATELET
Absolute Monocytes: 680 cells/uL (ref 200–950)
Basophils Absolute: 73 cells/uL (ref 0–200)
Basophils Relative: 0.9 %
Eosinophils Absolute: 57 cells/uL (ref 15–500)
Eosinophils Relative: 0.7 %
HCT: 34.2 % — ABNORMAL LOW (ref 35.0–45.0)
Hemoglobin: 10.5 g/dL — ABNORMAL LOW (ref 11.7–15.5)
Lymphs Abs: 2074 cells/uL (ref 850–3900)
MCH: 20 pg — ABNORMAL LOW (ref 27.0–33.0)
MCHC: 30.7 g/dL — ABNORMAL LOW (ref 32.0–36.0)
MCV: 65 fL — ABNORMAL LOW (ref 80.0–100.0)
Monocytes Relative: 8.4 %
Neutro Abs: 5216 cells/uL (ref 1500–7800)
Neutrophils Relative %: 64.4 %
Platelets: 313 10*3/uL (ref 140–400)
RBC: 5.26 10*6/uL — ABNORMAL HIGH (ref 3.80–5.10)
RDW: 22 % — ABNORMAL HIGH (ref 11.0–15.0)
Total Lymphocyte: 25.6 %
WBC: 8.1 10*3/uL (ref 3.8–10.8)

## 2019-11-15 LAB — COMPREHENSIVE METABOLIC PANEL
AG Ratio: 1.2 (calc) (ref 1.0–2.5)
ALT: 11 U/L (ref 6–29)
AST: 21 U/L (ref 10–35)
Albumin: 4.2 g/dL (ref 3.6–5.1)
Alkaline phosphatase (APISO): 79 U/L (ref 37–153)
BUN/Creatinine Ratio: 19 (calc) (ref 6–22)
BUN: 28 mg/dL — ABNORMAL HIGH (ref 7–25)
CO2: 21 mmol/L (ref 20–32)
Calcium: 10 mg/dL (ref 8.6–10.4)
Chloride: 109 mmol/L (ref 98–110)
Creat: 1.5 mg/dL — ABNORMAL HIGH (ref 0.60–0.88)
Globulin: 3.4 g/dL (calc) (ref 1.9–3.7)
Glucose, Bld: 98 mg/dL (ref 65–99)
Potassium: 4.2 mmol/L (ref 3.5–5.3)
Sodium: 143 mmol/L (ref 135–146)
Total Bilirubin: 0.3 mg/dL (ref 0.2–1.2)
Total Protein: 7.6 g/dL (ref 6.1–8.1)

## 2019-11-15 LAB — TSH: TSH: 5.05 mIU/L — ABNORMAL HIGH (ref 0.40–4.50)

## 2019-11-15 LAB — URINALYSIS
Bilirubin Urine: NEGATIVE
Glucose, UA: NEGATIVE
Hgb urine dipstick: NEGATIVE
Nitrite: NEGATIVE
Specific Gravity, Urine: 1.027 (ref 1.001–1.03)
pH: <=5 (ref 5.0–8.0)

## 2019-11-18 ENCOUNTER — Telehealth: Payer: Self-pay | Admitting: *Deleted

## 2019-11-18 NOTE — Telephone Encounter (Signed)
Patient returned call to clinic, gave lab results and recommendations per Dr. Hassan Rowan. Patient verbalized understanding. Patient confirmed that she is taking Synthroid 75 mcg daily. Patient stated that she would have to see if her son would be able to bring her to the office to give urine sample.

## 2019-11-18 NOTE — Telephone Encounter (Signed)
We could also let her son pick up collection tools and then drop back off urine sample for her.  As it turns out he needs to leave urine sample as well, so they can do this together too.  I am going to keep her on the Synthroid 75 mcg daily.

## 2019-11-18 NOTE — Telephone Encounter (Signed)
Left a message for the pt to return my call.  

## 2019-11-19 ENCOUNTER — Other Ambulatory Visit: Payer: Self-pay | Admitting: *Deleted

## 2019-11-19 DIAGNOSIS — R8290 Unspecified abnormal findings in urine: Secondary | ICD-10-CM

## 2019-11-19 NOTE — Telephone Encounter (Signed)
Patient is returning a call to [place].  Tried the office, but no answer.  Please call patient back at [phone]

## 2019-11-19 NOTE — Telephone Encounter (Signed)
Spoke with the pt and informed her of the message below.  Lab appt scheduled for 1/14 for the urine culture.

## 2019-11-20 ENCOUNTER — Other Ambulatory Visit: Payer: Self-pay

## 2019-11-20 ENCOUNTER — Other Ambulatory Visit: Payer: Medicare HMO

## 2019-11-20 DIAGNOSIS — R8290 Unspecified abnormal findings in urine: Secondary | ICD-10-CM | POA: Diagnosis not present

## 2019-11-21 LAB — URINE CULTURE
MICRO NUMBER:: 10042572
SPECIMEN QUALITY:: ADEQUATE

## 2019-12-18 ENCOUNTER — Other Ambulatory Visit: Payer: Self-pay

## 2019-12-19 ENCOUNTER — Other Ambulatory Visit (INDEPENDENT_AMBULATORY_CARE_PROVIDER_SITE_OTHER): Payer: Medicare HMO

## 2019-12-19 ENCOUNTER — Other Ambulatory Visit: Payer: Self-pay | Admitting: Family Medicine

## 2019-12-19 DIAGNOSIS — R7989 Other specified abnormal findings of blood chemistry: Secondary | ICD-10-CM | POA: Diagnosis not present

## 2019-12-19 LAB — BASIC METABOLIC PANEL WITH GFR
BUN: 28 mg/dL — ABNORMAL HIGH (ref 6–23)
CO2: 28 meq/L (ref 19–32)
Calcium: 9.5 mg/dL (ref 8.4–10.5)
Chloride: 105 meq/L (ref 96–112)
Creatinine, Ser: 1.23 mg/dL — ABNORMAL HIGH (ref 0.40–1.20)
GFR: 49.71 mL/min — ABNORMAL LOW
Glucose, Bld: 89 mg/dL (ref 70–99)
Potassium: 4.6 meq/L (ref 3.5–5.1)
Sodium: 139 meq/L (ref 135–145)

## 2020-01-11 ENCOUNTER — Ambulatory Visit: Payer: Medicare HMO | Attending: Internal Medicine

## 2020-01-11 DIAGNOSIS — Z23 Encounter for immunization: Secondary | ICD-10-CM | POA: Insufficient documentation

## 2020-01-11 NOTE — Progress Notes (Signed)
   Covid-19 Vaccination Clinic  Name:  Linda Lucero    MRN: [id number] DOB: 08/20/1928  01/11/2020  [name] was observed post Covid-19 immunization for 15 minutes without incident. She was provided with Vaccine Information Sheet and instruction to access the V-Safe system.   [name] was instructed to call 911 with any severe reactions post vaccine: Marland Kitchen Difficulty breathing  . Swelling of face and throat  . A fast heartbeat  . A bad rash all over body  . Dizziness and weakness   Immunizations Administered    Name Date Dose VIS Date Route   Pfizer COVID-19 Vaccine 01/11/2020  9:21 AM 0.3 mL 10/17/2019 Intramuscular   Manufacturer: [employer], [employer]   Lot: [id number]   NDC: [address]-0

## 2020-01-30 ENCOUNTER — Other Ambulatory Visit: Payer: Self-pay | Admitting: Family Medicine

## 2020-02-10 ENCOUNTER — Ambulatory Visit: Payer: Medicare HMO | Attending: Internal Medicine

## 2020-02-10 DIAGNOSIS — Z23 Encounter for immunization: Secondary | ICD-10-CM

## 2020-02-10 NOTE — Progress Notes (Signed)
   Covid-19 Vaccination Clinic  Name:  CHENA CHOHAN    MRN: [id number] DOB: 1928-07-21  02/10/2020  Ms. Starlin was observed post Covid-19 immunization for 15 minutes without incident. She was provided with Vaccine Information Sheet and instruction to access the V-Safe system.   Ms. Boster was instructed to call 911 with any severe reactions post vaccine: Marland Kitchen Difficulty breathing  . Swelling of face and throat  . A fast heartbeat  . A bad rash all over body  . Dizziness and weakness   Immunizations Administered    Name Date Dose VIS Date Route   Pfizer COVID-19 Vaccine 02/10/2020 10:52 AM 0.3 mL 10/17/2019 Intramuscular   Manufacturer: [employer], [employer]   Lot: [id number]   NDC: [address]-4

## 2020-04-23 ENCOUNTER — Other Ambulatory Visit: Payer: Self-pay | Admitting: Internal Medicine

## 2020-04-25 ENCOUNTER — Other Ambulatory Visit: Payer: Self-pay | Admitting: Family Medicine

## 2020-04-25 DIAGNOSIS — I10 Essential (primary) hypertension: Secondary | ICD-10-CM

## 2020-05-07 ENCOUNTER — Emergency Department (HOSPITAL_COMMUNITY)
Admission: EM | Admit: 2020-05-07 | Discharge: 2020-05-08 | Disposition: A | Discharge status: Home / Self Care | Payer: Medicare HMO | Attending: Emergency Medicine | Admitting: Emergency Medicine

## 2020-05-07 ENCOUNTER — Encounter (HOSPITAL_COMMUNITY): Payer: Self-pay

## 2020-05-07 DIAGNOSIS — I2510 Atherosclerotic heart disease of native coronary artery without angina pectoris: Secondary | ICD-10-CM | POA: Diagnosis not present

## 2020-05-07 DIAGNOSIS — Z87891 Personal history of nicotine dependence: Secondary | ICD-10-CM | POA: Diagnosis not present

## 2020-05-07 DIAGNOSIS — R531 Weakness: Secondary | ICD-10-CM | POA: Insufficient documentation

## 2020-05-07 DIAGNOSIS — I499 Cardiac arrhythmia, unspecified: Secondary | ICD-10-CM | POA: Diagnosis not present

## 2020-05-07 DIAGNOSIS — I10 Essential (primary) hypertension: Secondary | ICD-10-CM

## 2020-05-07 DIAGNOSIS — Z79899 Other long term (current) drug therapy: Secondary | ICD-10-CM | POA: Diagnosis not present

## 2020-05-07 LAB — CBC
HCT: 35.1 % — ABNORMAL LOW (ref 36.0–46.0)
Hemoglobin: 11.5 g/dL — ABNORMAL LOW (ref 12.0–15.0)
MCH: 21.3 pg — ABNORMAL LOW (ref 26.0–34.0)
MCHC: 32.8 g/dL (ref 30.0–36.0)
MCV: 65.1 fL — ABNORMAL LOW (ref 80.0–100.0)
Platelets: 267 10*3/uL (ref 150–400)
RBC: 5.39 MIL/uL — ABNORMAL HIGH (ref 3.87–5.11)
RDW: 18.6 % — ABNORMAL HIGH (ref 11.5–15.5)
WBC: 7.8 10*3/uL (ref 4.0–10.5)
nRBC: 0 % (ref 0.0–0.2)

## 2020-05-07 LAB — BASIC METABOLIC PANEL
Anion gap: 12 (ref 5–15)
BUN: 23 mg/dL (ref 8–23)
CO2: 21 mmol/L — ABNORMAL LOW (ref 22–32)
Calcium: 9.5 mg/dL (ref 8.9–10.3)
Chloride: 106 mmol/L (ref 98–111)
Creatinine, Ser: 1.36 mg/dL — ABNORMAL HIGH (ref 0.44–1.00)
GFR calc Af Amer: 40 mL/min — ABNORMAL LOW (ref >60)
GFR calc non Af Amer: 34 mL/min — ABNORMAL LOW (ref >60)
Glucose, Bld: 101 mg/dL — ABNORMAL HIGH (ref 70–99)
Potassium: 3.9 mmol/L (ref 3.5–5.1)
Sodium: 139 mmol/L (ref 135–145)

## 2020-05-07 LAB — URINALYSIS, ROUTINE W REFLEX MICROSCOPIC
Bacteria, UA: NONE SEEN
Bilirubin Urine: NEGATIVE
Glucose, UA: NEGATIVE mg/dL
Ketones, ur: NEGATIVE mg/dL
Nitrite: NEGATIVE
Protein, ur: 30 mg/dL — AB
Specific Gravity, Urine: 1.004 — ABNORMAL LOW (ref 1.005–1.030)
pH: 7 (ref 5.0–8.0)

## 2020-05-07 LAB — CBG MONITORING, ED: Glucose-Capillary: 105 mg/dL — ABNORMAL HIGH (ref 70–99)

## 2020-05-07 NOTE — ED Notes (Signed)
Son Derryl Harbor would like to be called when pt taken back to room instead, number under pt contacts. Other visitor had to leave.

## 2020-05-07 NOTE — ED Notes (Signed)
Linda Lucero [phone] would like to be called when pt taken back to room.

## 2020-05-07 NOTE — ED Triage Notes (Signed)
Pt bib [facility] w/ c/o hypertension. Pt has hx of HTN, hypothyroid. BP 222/80 w/ EMS. Pt states she has been compliant w/ meds. Pt also reports frequent urination and dark colored urine.

## 2020-05-08 ENCOUNTER — Other Ambulatory Visit: Payer: Self-pay

## 2020-05-08 LAB — TROPONIN I (HIGH SENSITIVITY)
Troponin I (High Sensitivity): 19 ng/L — ABNORMAL HIGH (ref <18)
Troponin I (High Sensitivity): 19 ng/L — ABNORMAL HIGH (ref <18)

## 2020-05-08 LAB — MAGNESIUM: Magnesium: 2.4 mg/dL (ref 1.7–2.4)

## 2020-05-08 NOTE — ED Notes (Signed)
Patient complaining of chest pain at this time.

## 2020-05-08 NOTE — ED Notes (Signed)
Discharge instructions discussed with pt. Pt verbalized understanding with no questions at this time. Pt to go home with son/caregiver at bedside

## 2020-05-08 NOTE — ED Notes (Signed)
Pt ambulated to bathroom with steady gait. 

## 2020-05-08 NOTE — Discharge Instructions (Addendum)
Please take your amlodipine 10 mg once a day.  Please make sure you are taking your hydralazine 50 mg 3 times a day instead of 2 times a day.  I recommend close follow-up with your primary care physician.

## 2020-05-08 NOTE — ED Provider Notes (Signed)
TIME SEEN: 1:23 AM  CHIEF COMPLAINT: Hypertension, generalized weakness  HPI: Patient is an 86 year old female with history of hypertension, hyperlipidemia who presents to the emergency department with complaints of generalized weakness today.  States that she was feeling poorly and she checked her blood pressure and it was 206/88.  She is on amlodipine 10 mg once daily and hydralazine 50 mg 3 times daily.  She just realized here in the emergency department that she is supposed to be taking hydralazine 3 times a day per her prescription bottle but is only taking it twice a day.  She denies headache.  Does report some blurry vision but no vision loss.  No numbness or focal weakness.  No chest pain or shortness of breath.  Intermittently feels her heart pounding.  She did take her medications in the waiting room about an hour ago.  No fevers, cough, vomiting, diarrhea.  Has had urinary frequency but no dysuria or hematuria.  ROS: See HPI Constitutional: no fever  Eyes: no drainage  ENT: no runny nose   Cardiovascular:  no chest pain  Resp: no SOB  GI: no vomiting GU: no dysuria Integumentary: no rash  Allergy: no hives  Musculoskeletal: no leg swelling  Neurological: no slurred speech ROS otherwise negative  PAST MEDICAL HISTORY/PAST SURGICAL HISTORY:  Past Medical History:  Diagnosis Date  . Coronary artery disease   . Hyperlipidemia   . Hypertension   . Renal artery stenosis (HCC)   . Sleep apnea   . Subclavian arterial stenosis (HCC)   . Systolic murmur   . Thyroid disease   . Ventricular hypertrophy     MEDICATIONS:  Prior to Admission medications   Medication Sig Start Date End Date Taking? Authorizing Provider  amLODipine (NORVASC) 10 MG tablet TAKE 1 TABLET BY MOUTH TWICE A DAY 04/23/20   Dorothyann Peng, MD  aspirin EC 325 MG tablet Take 1 tablet (325 mg total) by mouth daily. 09/06/18   Wynn Banker, MD  cholecalciferol (VITAMIN D) 1000 units tablet Take 1 tablet  (1,000 Units total) by mouth daily. 09/06/18   Koberlein, Paris Lore, MD  hydrALAZINE (APRESOLINE) 50 MG tablet TAKE 1 TABLET BY MOUTH THREE TIMES A DAY WITH FOOD 04/27/20   Koberlein, Jannette Spanner C, MD  ipratropium (ATROVENT) 0.03 % nasal spray Place 2 sprays into both nostrils every 12 (twelve) hours. 09/06/18   Wynn Banker, MD  levothyroxine (SYNTHROID) 75 MCG tablet TAKE 1 TABLET BY MOUTH EVERY DAY 01/30/20   Wynn Banker, MD  Multiple Vitamins-Minerals (MULTIVITAMIN ADULTS PO) Take by mouth.    [provider]    ALLERGIES:  Allergies  Allergen Reactions  . Diovan [Valsartan]   . Doxazosin     SOCIAL HISTORY:  Social History   Tobacco Use  . Smoking status: Former Smoker    Types: Cigarettes  . Smokeless tobacco: Never Used  Substance Use Topics  . Alcohol use: Not on file    FAMILY HISTORY: Family History  Problem Relation Age of Onset  . Heart disease Mother   . High blood pressure Mother   . Lung cancer Father   . Diabetes Maternal Grandmother   . Heart disease Maternal Grandmother     EXAM: BP (!) 187/81 (BP Location: Left Arm)   Pulse 80   Temp 98.3 F (36.8 C) (Oral)   Resp 20   SpO2 100%  CONSTITUTIONAL: Alert and oriented and responds appropriately to questions. Well-appearing; well-nourished HEAD: Normocephalic EYES: Conjunctivae clear, pupils  appear equal, EOM appear intact ENT: normal nose; moist mucous membranes NECK: Supple, normal ROM CARD: RRR; S1 and S2 appreciated; no murmurs, no clicks, no rubs, no gallops RESP: Normal chest excursion without splinting or tachypnea; breath sounds clear and equal bilaterally; no wheezes, no rhonchi, no rales, no hypoxia or respiratory distress, speaking full sentences ABD/GI: Normal bowel sounds; non-distended; soft, non-tender, no rebound, no guarding, no peritoneal signs, no hepatosplenomegaly BACK:  The back appears normal EXT: Normal ROM in all joints; no deformity noted, no edema; no  cyanosis SKIN: Normal color for age and race; warm; no rash on exposed skin NEURO: Moves all extremities equally, no pronator drift, sensation to light touch intact diffusely, cranial nerves II through XII intact, normal speech  PSYCH: The patient's mood and manner are appropriate.   MEDICAL DECISION MAKING: Patient here with complaints of generalized weakness and hypertension.  Blood pressure is slowly improving after she took her home medications.  Her work-up here has been reassuring including normal electrolytes, stable hemoglobin and 2 high-sensitivity troponins that have been stable.  She does have chronic kidney disease and this appears stable.  Urine does not appear infected.  She is requesting that we adjust her blood pressure medication from the emergency department however I do not feel that is appropriate at this time and have advised her to start taking her hydralazine 3 times daily and see if this continues to help her blood pressure and to follow-up with her primary care physician for further management.  She states that she is feeling much better.  She has no focal neurologic deficits.  She has no chest pain or shortness of breath.  Her EKG shows no ischemia.  I feel she is safe to be discharged home with her son.  At this time, I do not feel there is any life-threatening condition present. I have reviewed, interpreted and discussed all results (EKG, imaging, lab, urine as appropriate) and exam findings with patient/family. I have reviewed nursing notes and appropriate previous records.  I feel the patient is safe to be discharged home without further emergent workup and can continue workup as an outpatient as needed. Discussed usual and customary return precautions. Patient/family verbalize understanding and are comfortable with this plan.  Outpatient follow-up has been provided as needed. All questions have been answered.     EKG Interpretation  Date/Time:  Saturday May 08 2020  00:02:44 EDT Ventricular Rate:  85 PR Interval:  142 QRS Duration: 90 QT Interval:  372 QTC Calculation: 442 R Axis:   -6 Text Interpretation: Sinus rhythm with frequent Premature ventricular complexes Possible Left atrial enlargement Left ventricular hypertrophy with repolarization abnormality ( R in aVL , Sokolow-Lyon , Cornell product ) Abnormal ECG No significant change since last tracing Confirmed by Rochele Raring [phone]) on 05/08/2020 12:36:28 AM         [name] was evaluated in Emergency Department on 05/08/2020 for the symptoms described in the history of present illness. She was evaluated in the context of the global COVID-19 pandemic, which necessitated consideration that the patient might be at risk for infection with the SARS-CoV-2 virus that causes COVID-19. Institutional protocols and algorithms that pertain to the evaluation of patients at risk for COVID-19 are in a state of rapid change based on information released by regulatory bodies including the CDC and federal and state organizations. These policies and algorithms were followed during the patient's care in the ED.      Mariajose Mow, Layla Maw, DO  05/08/20 0527  

## 2020-05-14 ENCOUNTER — Ambulatory Visit (INDEPENDENT_AMBULATORY_CARE_PROVIDER_SITE_OTHER): Payer: Medicare HMO | Admitting: Family Medicine

## 2020-05-14 ENCOUNTER — Other Ambulatory Visit: Payer: Self-pay

## 2020-05-14 ENCOUNTER — Encounter: Payer: Self-pay | Admitting: Family Medicine

## 2020-05-14 VITALS — BP 180/58 | HR 79 | Temp 98.4°F | Ht 65.0 in | Wt 163.7 lb

## 2020-05-14 DIAGNOSIS — E785 Hyperlipidemia, unspecified: Secondary | ICD-10-CM

## 2020-05-14 DIAGNOSIS — N1831 Chronic kidney disease, stage 3a: Secondary | ICD-10-CM

## 2020-05-14 DIAGNOSIS — E611 Iron deficiency: Secondary | ICD-10-CM

## 2020-05-14 DIAGNOSIS — E890 Postprocedural hypothyroidism: Secondary | ICD-10-CM | POA: Diagnosis not present

## 2020-05-14 DIAGNOSIS — I10 Essential (primary) hypertension: Secondary | ICD-10-CM

## 2020-05-14 DIAGNOSIS — I499 Cardiac arrhythmia, unspecified: Secondary | ICD-10-CM

## 2020-05-14 DIAGNOSIS — G4700 Insomnia, unspecified: Secondary | ICD-10-CM

## 2020-05-14 NOTE — Patient Instructions (Addendum)
Try melatonin to help with sleep. Start with 3mg  and if that is not working ok to increase by 3mg  increments up to 9mg . There is short acting and long acting. You can try either.   Check blood pressures twice daily. Check 1 hour after taking medication. Record blood pressures and I will ask you about these numbers when I call with bloodwork results. (don't check bp more than 3 times/day)

## 2020-05-14 NOTE — Progress Notes (Signed)
Linda Lucero DOB: 01-26-1928 Encounter date: 05/14/2020  This is a 86 y.o. female who presents with Chief Complaint  Patient presents with  . Follow-up    History of present illness:  Recently in ER on 05/08/2020 for generalized weakness and hypertension.  She realized in the emergency room she was taking hydralazine only twice a day although bottle was written for 3 times a day.  Today patient states that she is "fair". Pressures have been up and down since losing daughter in march. Daughter passed right next to her in living room completely unexpected.   Arthritis:still has pain in shoulders.   Hypertension: Amlodipine 10 mg, hydralazine 50 mg 3 times daily. No light headedness or dizziness.   Sometimes not sleeping well; then wakes feeling weak. Feels like she would feel better if she could just sleep. Doesn't feel anxious, but thinks about loss of daughter a lot. Makes her feel sad.   Hypothyroid: Levothyroxine 75 mcg daily. Has been good about taking this medication.   Tried to take iron supplement but caused constipation. Has been eating liver instead to increase iron levels.    Allergies  Allergen Reactions  . Diovan [Valsartan]   . Doxazosin    Current Meds  Medication Sig  . amLODipine (NORVASC) 10 MG tablet TAKE 1 TABLET BY MOUTH TWICE A DAY (Patient taking differently: Take 10 mg by mouth in the morning and at bedtime. )  . aspirin EC 325 MG tablet Take 1 tablet (325 mg total) by mouth daily.  . cholecalciferol (VITAMIN D) 1000 units tablet Take 1 tablet (1,000 Units total) by mouth daily.  . hydrALAZINE (APRESOLINE) 50 MG tablet TAKE 1 TABLET BY MOUTH THREE TIMES A DAY WITH FOOD (Patient taking differently: Take 50 mg by mouth 3 (three) times daily. Take with food.)  . levothyroxine (SYNTHROID) 75 MCG tablet TAKE 1 TABLET BY MOUTH EVERY DAY (Patient taking differently: Take 75 mcg by mouth daily before breakfast. )  . Multiple Vitamins-Minerals (MULTIVITAMIN ADULTS  PO) Take 1 tablet by mouth daily.   . [DISCONTINUED] ipratropium (ATROVENT) 0.03 % nasal spray Place 2 sprays into both nostrils every 12 (twelve) hours.    Review of Systems  Constitutional: Negative for chills, fatigue and fever.  Respiratory: Negative for cough, chest tightness, shortness of breath and wheezing.   Cardiovascular: Negative for chest pain, palpitations and leg swelling.  Gastrointestinal: Negative for abdominal pain, constipation and diarrhea.  Musculoskeletal: Positive for arthralgias.  Neurological: Negative for dizziness, light-headedness and headaches.  Psychiatric/Behavioral: Positive for decreased concentration and sleep disturbance. The patient is not nervous/anxious.     Objective:  BP (!) 180/60 (BP Location: Left Arm, Patient Position: Sitting, Cuff Size: Large)   Pulse 79   Temp 98.4 F (36.9 C) (Temporal)   Ht 5\' 5"  (1.651 m)   Wt 163 lb 11.2 oz (74.3 kg)   SpO2 97%   BMI 27.24 kg/m   Weight: 163 lb 11.2 oz (74.3 kg)   BP Readings from Last 3 Encounters:  05/14/20 (!) 180/60  05/08/20 (!) 168/70  11/14/19 120/60   Wt Readings from Last 3 Encounters:  05/14/20 163 lb 11.2 oz (74.3 kg)  11/14/19 166 lb 6.4 oz (75.5 kg)  12/25/18 169 lb 6.4 oz (76.8 kg)   Recheck bp is the same; patient is due for hydralazine dose.  Physical Exam Constitutional:      General: She is not in acute distress.    Appearance: She is well-developed.  Cardiovascular:  Rate and Rhythm: Normal rate. Rhythm regularly irregular.     Heart sounds: Normal heart sounds. No murmur heard.  No friction rub.     Comments: Has about 8 beats and then short pause; on EKG shows PVC, consistent with prior EKG from ER. Rate is normal. No acute changes noted. Pulmonary:     Effort: Pulmonary effort is normal. No respiratory distress.     Breath sounds: Normal breath sounds. No wheezing or rales.  Musculoskeletal:     Right lower leg: No edema.     Left lower leg: No edema.   Neurological:     Mental Status: She is alert and oriented to person, place, and time.  Psychiatric:        Behavior: Behavior normal.     Assessment/Plan  1. Hypertension, unspecified type I have asked patient to check her blood pressures at home 1 hour after taking medication.  She is interested in meeting with in-house pharmacist and potentially blister packing for medications.  She does have a hard time remembering these, especially with recent trauma of her daughter's death.  I will check back in with her after the weekend and see how home pressure readings are looking.  Further medication management easier for her to be on once daily dosing of blood pressure medication.  Could consider hydrochlorothiazide (which she has taken in the past, and I do not see an intolerance to).  Kidney function does remain stable.  I am concerned with her low diastolic pressure and feel that this may limit our ability to treat her systolic pressures.  - Comprehensive metabolic panel; Future - Ambulatory referral to Chronic Care Management Services - Comprehensive metabolic panel  2. Postablative hypothyroidism - TSH; Future - Ambulatory referral to Chronic Care Management Services - TSH  3. Stage 3a chronic kidney disease Kidney function has remained stable.  We will continue to monitor, especially given high blood pressure and potential changes in blood pressure medication. - Ambulatory referral to Chronic Care Management Services  4. Cardiac arrhythmia, unspecified cardiac arrhythmia type EKG is stable from that obtained in the emergency room.  Premature ventricular complexes, but otherwise no acute changes noted. - EKG 12-Lead - Ambulatory referral to Chronic Care Management Services  5. Insomnia, unspecified type She will try melatonin first and letme know how she does with this. I agree with her that sleeping better might help with her feeling better all around. She wants to limit  medications, but we did discuss potential options for help with insomnia or anxiety, especially as she grieves her daughter's death.  6. Hyperlipidemia, unspecified hyperlipidemia type - Lipid panel; Future - Ambulatory referral to Chronic Care Management Services - Lipid panel  7. Iron deficiency She is working on iron replacement through food. - Iron, TIBC and Ferritin Panel; Future - Iron, TIBC and Ferritin Panel   on chart review - I do not see reason why (besides patient preference listed? But no other documentation) for stopping the losartan-hctz that she was taking. Would consider restarting one or both of these pending patient report. Certainly compliance would be easier for her with these.  Return for pending blood work results.  Time with patient, chart review, and charting over 40 minutes   Theodis Shove, MD

## 2020-05-15 LAB — LIPID PANEL
Cholesterol: 222 mg/dL — ABNORMAL HIGH (ref <200)
HDL: 93 mg/dL (ref >=50)
LDL Cholesterol (Calc): 113 mg/dL (calc) — ABNORMAL HIGH
Non-HDL Cholesterol (Calc): 129 mg/dL (calc) (ref <130)
Total CHOL/HDL Ratio: 2.4 (calc) (ref <5.0)
Triglycerides: 75 mg/dL (ref <150)

## 2020-05-15 LAB — COMPREHENSIVE METABOLIC PANEL
AG Ratio: 1.3 (calc) (ref 1.0–2.5)
ALT: 8 U/L (ref 6–29)
AST: 18 U/L (ref 10–35)
Albumin: 4 g/dL (ref 3.6–5.1)
Alkaline phosphatase (APISO): 74 U/L (ref 37–153)
BUN/Creatinine Ratio: 16 (calc) (ref 6–22)
BUN: 21 mg/dL (ref 7–25)
CO2: 21 mmol/L (ref 20–32)
Calcium: 9.5 mg/dL (ref 8.6–10.4)
Chloride: 107 mmol/L (ref 98–110)
Creat: 1.34 mg/dL — ABNORMAL HIGH (ref 0.60–0.88)
Globulin: 3.1 g/dL (calc) (ref 1.9–3.7)
Glucose, Bld: 99 mg/dL (ref 65–99)
Potassium: 4 mmol/L (ref 3.5–5.3)
Sodium: 139 mmol/L (ref 135–146)
Total Bilirubin: 0.4 mg/dL (ref 0.2–1.2)
Total Protein: 7.1 g/dL (ref 6.1–8.1)

## 2020-05-15 LAB — IRON,TIBC AND FERRITIN PANEL
%SAT: 18 % (calc) (ref 16–45)
Ferritin: 28 ng/mL (ref 16–288)
Iron: 56 ug/dL (ref 45–160)
TIBC: 304 mcg/dL (calc) (ref 250–450)

## 2020-05-15 LAB — SPECIMEN COMPROMISED

## 2020-05-15 LAB — TSH: TSH: 3.93 mIU/L (ref 0.40–4.50)

## 2020-05-15 LAB — EXTRA LAV TOP TUBE

## 2020-05-17 ENCOUNTER — Telehealth: Payer: Self-pay | Admitting: Family Medicine

## 2020-05-17 ENCOUNTER — Ambulatory Visit: Payer: Medicare HMO

## 2020-05-17 NOTE — Telephone Encounter (Signed)
Pt is scheduled for AWV on July 20th at 2pm

## 2020-05-17 NOTE — Progress Notes (Signed)
°  Chronic Care Management   Outreach Note  05/17/2020 Name: Linda Lucero MRN: [id number] DOB: 10/24/1928  Referred by: Wynn Banker, MD Reason for referral : Chronic Care Management (Initial CCM Outreach)   An unsuccessful telephone outreach was attempted today. The patient was referred to the pharmacist for assistance with care management and care coordination.   Follow Up Plan:   [name] Upstream Scheduler

## 2020-05-17 NOTE — Progress Notes (Signed)
  Chronic Care Management   Note  05/17/2020 Name: ONYX EDGLEY MRN: [id number] DOB: 09-01-1928  [name] is a 86 y.o. year old female who is a primary care patient of Koberlein, Paris Lore, MD. I reached out to [name] by phone today in response to a referral sent by Ms. Kelli Churn Mccaslin's PCP, Wynn Banker, MD.   Ms. Schadler was given information about Chronic Care Management services today including:  1. CCM service includes personalized support from designated clinical staff supervised by her physician, including individualized plan of care and coordination with other care providers 2. 24/7 contact phone numbers for assistance for urgent and routine care needs. 3. Service will only be billed when office clinical staff spend 20 minutes or more in a month to coordinate care. 4. Only one practitioner may furnish and bill the service in a calendar month. 5. The patient may stop CCM services at any time (effective at the end of the month) by phone call to the office staff.   Patient agreed to services and verbal consent obtained.   Follow up plan:   [name] Upstream Scheduler

## 2020-05-17 NOTE — Telephone Encounter (Signed)
Left message for patient to schedule Annual Wellness Visit.  Please schedule with Nurse Health Advisor Shannon Crews, RN at [facility]  

## 2020-05-19 ENCOUNTER — Other Ambulatory Visit: Payer: Self-pay | Admitting: Family Medicine

## 2020-05-19 ENCOUNTER — Telehealth: Payer: Self-pay | Admitting: Family Medicine

## 2020-05-19 MED ORDER — AMLODIPINE BESYLATE 10 MG PO TABS: 10.0000 mg | ORAL_TABLET | Freq: Every day | ORAL | 1 refills | Status: DC

## 2020-05-19 MED ORDER — HYDROCHLOROTHIAZIDE 25 MG PO TABS: 25.0000 mg | ORAL_TABLET | Freq: Every day | ORAL | 1 refills | Status: DC

## 2020-05-19 NOTE — Progress Notes (Signed)
I spoke with patient and after med list review; she has been taking her amlodipine 10mg  BID for some time (getting refilled by provider she has not seen in past but who was overseeing PA). I advised her to stop morning dose of amlodipine and continue with 10 mg in the evening.  We are going to start hydrochlorothiazide 25 mg in the morning.  I have encouraged her to continue to check blood pressures.  I have asked her to continue with the hydralazine.  She will begin on Tuesday for a wellness visit and can have blood pressure rechecked at that time.  I will have her follow back up in the office in 1 month so we can recheck blood pressure at that time.

## 2020-05-19 NOTE — Telephone Encounter (Signed)
Pt calling to give you her blood pressure results. On 07/13 at 8:30 am 174/81 and pulse was 85, 07/14 at 11:00 am 171/84 and pulse 84.

## 2020-05-19 NOTE — Telephone Encounter (Signed)
Pt checks her blood pressure and pulse before taking meds.

## 2020-05-19 NOTE — Telephone Encounter (Signed)
I called patient and we spoke about blood pressure.  She rechecked well she was on the phone with me and blood pressure was 153/71 with heart rate of 77.  She is most recently taken hydralazine 45 minutes ago.  We are going to stop the twice daily dosing of amlodipine (which was started by previous provider) and start just evening dosing of 10 mg amlodipine.  I am going to add hydrochlorothiazide 25 mg to morning dose.  We will continue with hydralazine for now, although I would like to back off of this medication if able to in the future to simplify med dosing for her and hopefully get more stability with blood pressure readings.    Linda Lucero-please set up a 1 month follow-up for blood pressure in the office.  Patient will be in on Tuesday and can have blood pressure checked during her wellness visit, but I would like to have another appointment set up and have her bring home cuff with her to that visit as well as recorded blood pressures so that we can track how she is doing.  I have encouraged her to reach out with any concerns.

## 2020-05-20 NOTE — Telephone Encounter (Signed)
Spoke with the pt and scheduled an appt for 8/18 at 2:30pm.

## 2020-05-25 ENCOUNTER — Other Ambulatory Visit: Payer: Self-pay

## 2020-05-25 ENCOUNTER — Ambulatory Visit (INDEPENDENT_AMBULATORY_CARE_PROVIDER_SITE_OTHER): Payer: Medicare HMO

## 2020-05-25 VITALS — BP 190/64 | HR 76 | Temp 98.0°F | Resp 97 | Ht 67.0 in | Wt 160.8 lb

## 2020-05-25 DIAGNOSIS — Z0000 Encounter for general adult medical examination without abnormal findings: Secondary | ICD-10-CM

## 2020-05-25 NOTE — Patient Instructions (Addendum)
Linda Lucero , Thank you for taking time to come for your Medicare Wellness Visit. I appreciate your ongoing commitment to your health goals. Please review the following plan we discussed and let me know if I can assist you in the future.   Screening recommendations/referrals: Colonoscopy: No longer required Mammogram: No longer required Bone Density: Currently Due, order placed this visit Recommended yearly ophthalmology/optometry visit for glaucoma screening and checkup Recommended yearly dental visit for hygiene and checkup  Vaccinations: Influenza vaccine: Up to date, next due 06/2020 Pneumococcal vaccine: Pneumovax 23 currently due, may receive at next office visit Tdap vaccine: Currently due, may check cost with your insurance or await an injury  Shingles vaccine: Currently due, please check with your pharmacy in regards to cost   Covid: completed 3/7 &02/10/20  Advanced directives: Packet given  Conditions/risks identified: Get Blood pressure under control  Next appointment: 06/23/2020 @ 2:30 PM with Dr. Hassan Rowan    Preventive Care 84 Years and Older, Female Preventive care refers to lifestyle choices and visits with your health care provider that can promote health and wellness. What does preventive care include?  A yearly physical exam. This is also called an annual well check.  Dental exams once or twice a year.  Routine eye exams. Ask your health care provider how often you should have your eyes checked.  Personal lifestyle choices, including:  Daily care of your teeth and gums.  Regular physical activity.  Eating a healthy diet.  Avoiding tobacco and drug use.  Limiting alcohol use.  Practicing safe sex.  Taking low-dose aspirin every day.  Taking vitamin and mineral supplements as recommended by your health care provider. What happens during an annual well check? The services and screenings done by your health care provider during your annual well check  will depend on your age, overall health, lifestyle risk factors, and family history of disease. Counseling  Your health care provider may ask you questions about your:  Alcohol use.  Tobacco use.  Drug use.  Emotional well-being.  Home and relationship well-being.  Sexual activity.  Eating habits.  History of falls.  Memory and ability to understand (cognition).  Work and work Astronomer.  Reproductive health. Screening  You may have the following tests or measurements:  Height, weight, and BMI.  Blood pressure.  Lipid and cholesterol levels. These may be checked every 5 years, or more frequently if you are over 25 years old.  Skin check.  Lung cancer screening. You may have this screening every year starting at age 84 if you have a 30-pack-year history of smoking and currently smoke or have quit within the past 15 years.  Fecal occult blood test (FOBT) of the stool. You may have this test every year starting at age 84.  Flexible sigmoidoscopy or colonoscopy. You may have a sigmoidoscopy every 5 years or a colonoscopy every 10 years starting at age 84.  Hepatitis C blood test.  Hepatitis B blood test.  Sexually transmitted disease (STD) testing.  Diabetes screening. This is done by checking your blood sugar (glucose) after you have not eaten for a while (fasting). You may have this done every 1-3 years.  Bone density scan. This is done to screen for osteoporosis. You may have this done starting at age 84.  Mammogram. This may be done every 1-2 years. Talk to your health care provider about how often you should have regular mammograms. Talk with your health care provider about your test results, treatment options, and if  necessary, the need for more tests. Vaccines  Your health care provider may recommend certain vaccines, such as:  Influenza vaccine. This is recommended every year.  Tetanus, diphtheria, and acellular pertussis (Tdap, Td) vaccine. You may  need a Td booster every 10 years.  Zoster vaccine. You may need this after age 57.  Pneumococcal 13-valent conjugate (PCV13) vaccine. One dose is recommended after age 84.  Pneumococcal polysaccharide (PPSV23) vaccine. One dose is recommended after age 23. Talk to your health care provider about which screenings and vaccines you need and how often you need them. This information is not intended to replace advice given to you by your health care provider. Make sure you discuss any questions you have with your health care provider. Document Released: 11/19/2015 Document Revised: 07/12/2016 Document Reviewed: 08/24/2015 Elsevier Interactive Patient Education  2017 [employer] Prevention in the Home Falls can cause injuries. They can happen to people of all ages. There are many things you can do to make your home safe and to help prevent falls. What can I do on the outside of my home?  Regularly fix the edges of walkways and driveways and fix any cracks.  Remove anything that might make you trip as you walk through a door, such as a raised step or threshold.  Trim any bushes or trees on the path to your home.  Use bright outdoor lighting.  Clear any walking paths of anything that might make someone trip, such as rocks or tools.  Regularly check to see if handrails are loose or broken. Make sure that both sides of any steps have handrails.  Any raised decks and porches should have guardrails on the edges.  Have any leaves, snow, or ice cleared regularly.  Use sand or salt on walking paths during winter.  Clean up any spills in your garage right away. This includes oil or grease spills. What can I do in the bathroom?  Use night lights.  Install grab bars by the toilet and in the tub and shower. Do not use towel bars as grab bars.  Use non-skid mats or decals in the tub or shower.  If you need to sit down in the shower, use a plastic, non-slip stool.  Keep the floor  dry. Clean up any water that spills on the floor as soon as it happens.  Remove soap buildup in the tub or shower regularly.  Attach bath mats securely with double-sided non-slip rug tape.  Do not have throw rugs and other things on the floor that can make you trip. What can I do in the bedroom?  Use night lights.  Make sure that you have a light by your bed that is easy to reach.  Do not use any sheets or blankets that are too big for your bed. They should not hang down onto the floor.  Have a firm chair that has side arms. You can use this for support while you get dressed.  Do not have throw rugs and other things on the floor that can make you trip. What can I do in the kitchen?  Clean up any spills right away.  Avoid walking on wet floors.  Keep items that you use a lot in easy-to-reach places.  If you need to reach something above you, use a strong step stool that has a grab bar.  Keep electrical cords out of the way.  Do not use floor polish or wax that makes floors slippery. If you must  use wax, use non-skid floor wax.  Do not have throw rugs and other things on the floor that can make you trip. What can I do with my stairs?  Do not leave any items on the stairs.  Make sure that there are handrails on both sides of the stairs and use them. Fix handrails that are broken or loose. Make sure that handrails are as long as the stairways.  Check any carpeting to make sure that it is firmly attached to the stairs. Fix any carpet that is loose or worn.  Avoid having throw rugs at the top or bottom of the stairs. If you do have throw rugs, attach them to the floor with carpet tape.  Make sure that you have a light switch at the top of the stairs and the bottom of the stairs. If you do not have them, ask someone to add them for you. What else can I do to help prevent falls?  Wear shoes that:  Do not have high heels.  Have rubber bottoms.  Are comfortable and fit you  well.  Are closed at the toe. Do not wear sandals.  If you use a stepladder:  Make sure that it is fully opened. Do not climb a closed stepladder.  Make sure that both sides of the stepladder are locked into place.  Ask someone to hold it for you, if possible.  Clearly mark and make sure that you can see:  Any grab bars or handrails.  First and last steps.  Where the edge of each step is.  Use tools that help you move around (mobility aids) if they are needed. These include:  Canes.  Walkers.  Scooters.  Crutches.  Turn on the lights when you go into a dark area. Replace any light bulbs as soon as they burn out.  Set up your furniture so you have a clear path. Avoid moving your furniture around.  If any of your floors are uneven, fix them.  If there are any pets around you, be aware of where they are.  Review your medicines with your doctor. Some medicines can make you feel dizzy. This can increase your chance of falling. Ask your doctor what other things that you can do to help prevent falls. This information is not intended to replace advice given to you by your health care provider. Make sure you discuss any questions you have with your health care provider. Document Released: 08/19/2009 Document Revised: 03/30/2016 Document Reviewed: 11/27/2014 Elsevier Interactive Patient Education  2017 [employer].

## 2020-05-25 NOTE — Progress Notes (Addendum)
Subjective:   Linda Lucero is a 86 y.o. female who presents for Medicare Annual (Subsequent) preventive examination.  Virtual Visit via Telephone Note  I connected with  Linda Lucero on 05/25/20 at  2:00 PM EDT by telephone and verified that I am speaking with the correct person using two identifiers.  Medicare Annual Wellness visit completed telephonically due to Covid-19 pandemic.   Persons participating in this call: This Health Coach and this patient.   Location: Patient: Home Provider: Office   I discussed the limitations, risks, security and privacy concerns of performing an evaluation and management service by telephone and the availability of in person appointments. The patient expressed understanding and agreed to proceed.  Unable to perform video visit due to video visit attempted and failed and/or patient does not have video capability.   Some vital signs may be absent or patient reported.   Marzella Schlein, LPN   Review of Systems    Cardiac Risk Factors include: advanced age (>40men, >68 women);hypertension     Objective:    There were no vitals filed for this visit. There is no height or weight on file to calculate BMI.  Advanced Directives 05/25/2020 05/08/2020 12/26/2018 12/25/2018  Does Patient Have a Medical Advance Directive? No No No No  Would patient like information on creating a medical advance directive? Yes (MAU/Ambulatory/Procedural Areas - Information given) No - Patient declined Yes (MAU/Ambulatory/Procedural Areas - Information given) Yes (MAU/Ambulatory/Procedural Areas - Information given)    Current Medications (verified) Outpatient Encounter Medications as of 05/25/2020  Medication Sig  . amLODipine (NORVASC) 10 MG tablet Take 1 tablet (10 mg total) by mouth at bedtime.  Marland Kitchen aspirin EC 325 MG tablet Take 1 tablet (325 mg total) by mouth daily.  . cholecalciferol (VITAMIN D) 1000 units tablet Take 1 tablet (1,000 Units total) by mouth  daily.  . hydrALAZINE (APRESOLINE) 50 MG tablet TAKE 1 TABLET BY MOUTH THREE TIMES A DAY WITH FOOD (Patient taking differently: Take 50 mg by mouth 3 (three) times daily. Take with food.)  . hydrochlorothiazide (HYDRODIURIL) 25 MG tablet Take 1 tablet (25 mg total) by mouth daily.  Marland Kitchen levothyroxine (SYNTHROID) 75 MCG tablet TAKE 1 TABLET BY MOUTH EVERY DAY (Patient taking differently: Take 75 mcg by mouth daily before breakfast. )  . Multiple Vitamins-Minerals (MULTIVITAMIN ADULTS PO) Take 1 tablet by mouth daily.    No facility-administered encounter medications on file as of 05/25/2020.    Allergies (verified) Diovan [valsartan] and Doxazosin   History: Past Medical History:  Diagnosis Date  . Coronary artery disease   . Hyperlipidemia   . Hypertension   . Renal artery stenosis (HCC)   . Sleep apnea   . Subclavian arterial stenosis (HCC)   . Systolic murmur   . Thyroid disease   . Ventricular hypertrophy    Past Surgical History:  Procedure Laterality Date  . CHOLECYSTECTOMY     Family History  Problem Relation Age of Onset  . Heart disease Mother   . High blood pressure Mother   . Lung cancer Father   . Diabetes Maternal Grandmother   . Heart disease Maternal Grandmother    Social History   Socioeconomic History  . Marital status: Widowed    Spouse name: Not on file  . Number of children: 4  . Years of education: Not on file  . Highest education level: Not on file  Occupational History  . Occupation: retired    Comment: retired  Tobacco  Use  . Smoking status: Former Smoker    Types: Cigarettes  . Smokeless tobacco: Never Used  Substance and Sexual Activity  . Alcohol use: Not on file  . Drug use: Not on file  . Sexual activity: Not on file  Other Topics Concern  . Not on file  Social History Narrative   Lives with daughter in 2 level home; has 1 son in [place], and 2 other children who lives outside of [place].   Social Determinants of Health   Financial  Resource Strain: Low Risk   . Difficulty of Paying Living Expenses: Not hard at all  Food Insecurity: No Food Insecurity  . Worried About Programme researcher, broadcasting/film/video in the Last Year: Never true  . Ran Out of Food in the Last Year: Never true  Transportation Needs: No Transportation Needs  . Lack of Transportation (Medical): No  . Lack of Transportation (Non-Medical): No  Physical Activity: Inactive  . Days of Exercise per Week: 0 days  . Minutes of Exercise per Session: 0 min  Stress: No Stress Concern Present  . Feeling of Stress : Only a little  Social Connections: Moderately Isolated  . Frequency of Communication with Friends and Family: More than three times a week  . Frequency of Social Gatherings with Friends and Family: Once a week  . Attends Religious Services: More than 4 times per year  . Active Member of Clubs or Organizations: No  . Attends Banker Meetings: Never  . Marital Status: Widowed    Tobacco Counseling Counseling given: Not Answered   Clinical Intake:  Pre-visit preparation completed: Yes  Pain : No/denies pain     Nutritional Risks: None Diabetes: No  How often do you need to have someone help you when you read instructions, pamphlets, or other written materials from your doctor or pharmacy?: 1 - Never  Diabetic?No  Interpreter Needed?: No  Information entered by :: Lanier Ensign, LPN   Activities of Daily Living In your present state of health, do you have any difficulty performing the following activities: 05/25/2020  Hearing? Y  Comment mild hearing loss  Vision? Y  Comment blurred vision at times  Difficulty concentrating or making decisions? N  Walking or climbing stairs? N  Dressing or bathing? N  Doing errands, shopping? N  Preparing Food and eating ? N  Using the Toilet? N  In the past six months, have you accidently leaked urine? N  Do you have problems with loss of bowel control? N  Managing your Medications? N    Managing your Finances? N  Housekeeping or managing your Housekeeping? N  Some recent data might be hidden    Patient Care Team: Wynn Banker, MD as PCP - General (Family Medicine) Judd Lien [place] R[place], [facility] as Pharmacist (Pharmacist)  Indicate any recent Medical Services you may have received from other than [facility] providers in the past year (date may be approximate).     Assessment:   This is a routine wellness examination for [place].  Hearing/Vision screen  Hearing Screening   125Hz  250Hz  500Hz  1000Hz  2000Hz  3000Hz  4000Hz  6000Hz  8000Hz   Right ear:           Left ear:           Comments: Pt states mild hearing loss no hearing aids at this time  Vision Screening Comments: Pt states blurry vision at times will follow up with eye Dr referred by Dr  Dietary issues and exercise activities discussed:  Current Exercise Habits: The patient does not participate in regular exercise at present, Exercise limited by: orthopedic condition(s);respiratory conditions(s)  Goals    . Patient Stated     Less hip pain!     . Patient Stated     Get blood pressure under control      Depression Screen PHQ 2/9 Scores 05/25/2020 05/14/2020 12/25/2018  PHQ - 2 Score 0 4 2  PHQ- 9 Score - - 6    Fall Risk Fall Risk  05/25/2020 12/25/2018  Falls in the past year? 0 0  Number falls in past yr: 0 -  Injury with Fall? 0 -  Risk for fall due to : Impaired mobility;Impaired balance/gait;Impaired vision Impaired balance/gait;Impaired mobility;Impaired vision  Follow up Falls prevention discussed Falls prevention discussed    Any stairs in or around the home? Yes  If so, are there any without handrails? Yes  Home free of loose throw rugs in walkways, pet beds, electrical cords, etc? Yes  Adequate lighting in your home to reduce risk of falls? Yes   ASSISTIVE DEVICES UTILIZED TO PREVENT FALLS:  Life alert? No  Use of a cane, walker or w/c? Yes  Grab bars in the bathroom? No  Shower  chair or bench in shower? No  Elevated toilet seat or a handicapped toilet? Yes   TIMED UP AND GO:  Was the test performed? Yes .  Length of time to ambulate 10 feet: 10 sec.   Gait slow and steady with assistive device  Cognitive Function:     6CIT Screen 05/25/2020  What Year? 0 points  What month? 0 points  What time? 0 points  Count back from 20 0 points  Months in reverse 0 points  Repeat phrase 4 points  Total Score 4    Immunizations Immunization History  Administered Date(s) Administered  . Influenza, High Dose Seasonal PF 09/04/2018, 08/01/2019  . Influenza-Unspecified 08/01/2019  . PFIZER SARS-COV-2 Vaccination 01/11/2020, 02/10/2020  . Pneumococcal Conjugate-13 10/02/2016  . Zoster 10/02/2016    TDAP status: Due, Education has been provided regarding the importance of this vaccine. Advised may receive this vaccine at local pharmacy or Health Dept. Aware to provide a copy of the vaccination record if obtained from local pharmacy or Health Dept. Verbalized acceptance and understanding. Flu Vaccine status: Up to date  Covid-19 vaccine status: Completed vaccines  Qualifies for Shingles Vaccine? Yes   Zostavax completed Yes   Shingrix Completed?: No.    Education has been provided regarding the importance of this vaccine. Patient has been advised to call insurance company to determine out of pocket expense if they have not yet received this vaccine. Advised may also receive vaccine at local pharmacy or Health Dept. Verbalized acceptance and understanding.  Screening Tests Health Maintenance  Topic Date Due  . TETANUS/TDAP  Never done  . DEXA SCAN  Never done  . PNA vac Low Risk Adult (2 of 2 - PPSV23) 10/02/2017  . INFLUENZA VACCINE  06/06/2020  . COVID-19 Vaccine  Completed    Health Maintenance  Health Maintenance Due  Topic Date Due  . TETANUS/TDAP  Never done  . DEXA SCAN  Never done  . PNA vac Low Risk Adult (2 of 2 - PPSV23) 10/02/2017     Colorectal cancer screening: No longer required.  Mammogram status: No longer required.  Bone Density status: Ordered 05/25/20. Pt provided with contact info and advised to call to schedule appt.   Additional Screening:    Vision Screening:  Recommended annual ophthalmology exams for early detection of glaucoma and other disorders of the eye. Is the patient up to date with their annual eye exam?  No  Who is the provider or what is the name of the office in which the patient attends annual eye exams? Pt states will follow up with referred eye Dr   Dental Screening: Recommended annual dental exams for proper oral hygiene  Community Resource Referral / Chronic Care Management: CRR required this visit?  No   CCM required this visit?  No      Plan:     I have personally reviewed and noted the following in the patient's chart:   . Medical and social history . Use of alcohol, tobacco or illicit drugs  . Current medications and supplements . Functional ability and status . Nutritional status . Physical activity . Advanced directives . List of other physicians . Hospitalizations, surgeries, and ER visits in previous 12 months . Vitals . Screenings to include cognitive, depression, and falls . Referrals and appointments  In addition, I have reviewed and discussed with patient certain preventive protocols, quality metrics, and best practice recommendations. A written personalized care plan for preventive services as well as general preventive health recommendations were provided to patient.     Marzella Schleinina H Esma Kilts, LPN   1/61/09607/20/2021   Nurse Notes: None

## 2020-05-25 NOTE — Progress Notes (Signed)
Subjective

## 2020-06-23 ENCOUNTER — Encounter: Payer: Self-pay | Admitting: Family Medicine

## 2020-06-23 ENCOUNTER — Other Ambulatory Visit: Payer: Self-pay

## 2020-06-23 ENCOUNTER — Ambulatory Visit (INDEPENDENT_AMBULATORY_CARE_PROVIDER_SITE_OTHER): Payer: Medicare HMO | Admitting: Family Medicine

## 2020-06-23 VITALS — BP 164/90 | HR 78 | Temp 99.1°F | Ht 67.0 in | Wt 159.8 lb

## 2020-06-23 DIAGNOSIS — I10 Essential (primary) hypertension: Secondary | ICD-10-CM | POA: Diagnosis not present

## 2020-06-23 NOTE — Patient Instructions (Signed)
Continue with the hydralazine three times daily.   Take the hydrochlorothiazide in the morning (1 tab)  Take amlodipine in the evening (1 tab). WE ARE STOPPING THE SECOND DOSE OF THE AMLODIPINE. So you will just take the amlodipine once a day (at bedtime).   I will call you next week to check in on your pressures. Call me sooner if you note they are increasing.   Check your blood pressure 1-2 hours after medication OR if you are feeling "funny".

## 2020-06-23 NOTE — Progress Notes (Signed)
Linda Lucero DOB: [birth year] Encounter date: 06/23/2020  This is a 86 y.o. female who presents with Chief Complaint  Patient presents with   Follow-up    History of present illness: Does feel like blood pressure is improving. Has been in the 140-150 systolic. This morning was 139/73. She is taking 3 hydralazine, hydrochlorothiazide 25mg . No chest pain or pressure, no headaches.  She has been taking the 10mg  amlodipine twice daily for years. No swelling in legs.    Allergies  Allergen Reactions   Diovan [Valsartan]    Doxazosin    Current Meds  Medication Sig   amLODipine (NORVASC) 10 MG tablet Take 1 tablet (10 mg total) by mouth at bedtime.   aspirin EC 325 MG tablet Take 1 tablet (325 mg total) by mouth daily.   cholecalciferol (VITAMIN D) 1000 units tablet Take 1 tablet (1,000 Units total) by mouth daily.   hydrALAZINE (APRESOLINE) 50 MG tablet TAKE 1 TABLET BY MOUTH THREE TIMES A DAY WITH FOOD (Patient taking differently: Take 50 mg by mouth 3 (three) times daily. Take with food.)   hydrochlorothiazide (HYDRODIURIL) 25 MG tablet Take 1 tablet (25 mg total) by mouth daily.   levothyroxine (SYNTHROID) 75 MCG tablet TAKE 1 TABLET BY MOUTH EVERY DAY (Patient taking differently: Take 75 mcg by mouth daily before breakfast. )   Multiple Vitamins-Minerals (MULTIVITAMIN ADULTS PO) Take 1 tablet by mouth daily.     Review of Systems  Constitutional: Negative for chills, fatigue and fever.  Respiratory: Negative for cough, chest tightness, shortness of breath and wheezing.   Cardiovascular: Negative for chest pain, palpitations and leg swelling.    Objective:  BP (!) 164/90 (BP Location: Left Arm, Patient Position: Sitting, Cuff Size: Large)    Pulse 78    Temp 99.1 F (37.3 C) (Oral)    Ht 5\' 7"  (1.702 m)    Wt 159 lb 12.8 oz (72.5 kg)    BMI 25.03 kg/m   Weight: 159 lb 12.8 oz (72.5 kg)   BP Readings from Last 3 Encounters:  06/23/20 (!) 164/90  05/25/20 (!)  190/64  05/14/20 (!) 180/58   Wt Readings from Last 3 Encounters:  06/23/20 159 lb 12.8 oz (72.5 kg)  05/25/20 160 lb 12.8 oz (72.9 kg)  05/14/20 163 lb 11.2 oz (74.3 kg)    Physical Exam Constitutional:      General: She is not in acute distress.    Appearance: She is well-developed.  Cardiovascular:     Rate and Rhythm: Normal rate and regular rhythm.     Heart sounds: Normal heart sounds. No murmur heard.  No friction rub.  Pulmonary:     Effort: Pulmonary effort is normal. No respiratory distress.     Breath sounds: Normal breath sounds. No wheezing or rales.  Musculoskeletal:     Right lower leg: No edema.     Left lower leg: No edema.  Neurological:     Mental Status: She is alert and oriented to person, place, and time.  Psychiatric:        Behavior: Behavior normal.     Assessment/Plan  1. Hypertension, unspecified type bp running better at home. Patient has been getting amlodipine from a different provider (patient doesn't know who this is) and has been taking 10mg  BID. We discussed that max dosing is 10mg . We are going to start this just 10mg  at bedtime. Continue hctz 25mg  in morning. Continue hydralazine 25mg  TID. Continue to monitor bp at home. We will  need to adjust medication to optimize blood pressure, but I am going to make slow changes due to age/ low diastolic.    Return in about 1 month (around 07/24/2020) for Chronic condition visit.    Theodis Shove, MD

## 2020-06-28 ENCOUNTER — Telehealth: Payer: Self-pay | Admitting: *Deleted

## 2020-06-28 NOTE — Telephone Encounter (Signed)
Spoke with the pt and she stated her blood pressure seems to be lower, denies feeling funny and stated she feels better.  BP readings are as below: 8/19-149/62  Pulse 69 8/20-149/74  Pulse 71 8/21-144/67  Pulse 73 8/22-not taken due to early church service 8/23-142/64  Pulse 74  Message sent to PCP.

## 2020-06-28 NOTE — Telephone Encounter (Signed)
-----   Message from Wynn Banker, MD sent at 06/24/2020  1:18 PM EDT ----- Please call on Monday: Please see how pressures are running with our small adjustments we made. How are pressures when she feels "funny"?

## 2020-06-29 DIAGNOSIS — H547 Unspecified visual loss: Secondary | ICD-10-CM | POA: Diagnosis not present

## 2020-06-29 DIAGNOSIS — R69 Illness, unspecified: Secondary | ICD-10-CM | POA: Diagnosis not present

## 2020-06-29 DIAGNOSIS — Z8249 Family history of ischemic heart disease and other diseases of the circulatory system: Secondary | ICD-10-CM | POA: Diagnosis not present

## 2020-06-29 DIAGNOSIS — G8929 Other chronic pain: Secondary | ICD-10-CM | POA: Diagnosis not present

## 2020-06-29 DIAGNOSIS — Z87891 Personal history of nicotine dependence: Secondary | ICD-10-CM | POA: Diagnosis not present

## 2020-06-29 DIAGNOSIS — E039 Hypothyroidism, unspecified: Secondary | ICD-10-CM | POA: Diagnosis not present

## 2020-06-29 DIAGNOSIS — Z833 Family history of diabetes mellitus: Secondary | ICD-10-CM | POA: Diagnosis not present

## 2020-06-29 DIAGNOSIS — Z7982 Long term (current) use of aspirin: Secondary | ICD-10-CM | POA: Diagnosis not present

## 2020-06-29 DIAGNOSIS — M1990 Unspecified osteoarthritis, unspecified site: Secondary | ICD-10-CM | POA: Diagnosis not present

## 2020-06-29 DIAGNOSIS — I10 Essential (primary) hypertension: Secondary | ICD-10-CM | POA: Diagnosis not present

## 2020-06-29 NOTE — Telephone Encounter (Signed)
Spoke with the pt, informed her of the message below and she stated she is not taking Atenolol.  Patient stated she is taking Amlodipine-1 pill daily.  Message sent to PCP.

## 2020-06-29 NOTE — Telephone Encounter (Signed)
Those numbers look much better.  Have her keep checking at home and stay on the same doses of medication for the time being.  Please review med list with her (I believe she is only taking half of the atenolol tablet).  Lets keep the October follow-up, but have her let me know if any concerns with blood pressure.  I would like for her systolic to stay below 150.

## 2020-06-30 NOTE — Telephone Encounter (Signed)
That's what she is supposed to be doing. My error previously. Let's have her stay the course for now and remind her to bring home cuff to her upcoming visit.

## 2020-06-30 NOTE — Telephone Encounter (Signed)
Spoke with the pt and informed her of the message below.   

## 2020-07-19 NOTE — Chronic Care Management (AMB) (Signed)
Chronic Care Management Pharmacy  Name: Linda Lucero  MRN: [id number] DOB: 05/23/1928   Chief Complaint/ HPI  Linda Lucero,  86 y.o. , female presents for their Initial CCM visit with the clinical pharmacist In office.  PCP : Wynn Banker, MD Patient Care Team: Wynn Banker, MD as PCP - General (Family Medicine) Gaspar Cola, [facility] as Pharmacist (Pharmacist)  Their chronic conditions include: Hypertension, Chronic Kidney Disease and Hypothyroidism   Office Visits: 06/23/20: Patient presented to Dr. Hassan Rowan for follow-up. BP in clinic 164/90. Home BP 140-150. Patient was taking amlodipine 10 mg twice daily, decreased to 10 mg daily.  05/25/20: Patient presented for AWV.  05/14/20: Patient presented to Dr. Hassan Rowan for follow-up. BP in clinic 180/58. Atrovent stopped. Melatonin recommended for sleep.   Consult Visit: 05/07/20: Patient presented to ED for hypertensive urgency. Patient only taking hydralazine twice daily instead of TID.   Allergies  Allergen Reactions   Diovan [Valsartan]    Doxazosin     Medications: Outpatient Encounter Medications as of 07/21/2020  Medication Sig   amLODipine (NORVASC) 10 MG tablet Take 1 tablet (10 mg total) by mouth at bedtime.   aspirin EC 325 MG tablet Take 1 tablet (325 mg total) by mouth daily. (Patient taking differently: Take 325 mg by mouth daily as needed. )   cholecalciferol (VITAMIN D) 1000 units tablet Take 1 tablet (1,000 Units total) by mouth daily.   hydrALAZINE (APRESOLINE) 50 MG tablet TAKE 1 TABLET BY MOUTH THREE TIMES A DAY WITH FOOD (Patient taking differently: Take 50 mg by mouth 3 (three) times daily. Take with food.)   hydrochlorothiazide (HYDRODIURIL) 25 MG tablet Take 1 tablet (25 mg total) by mouth daily.   levothyroxine (SYNTHROID) 75 MCG tablet TAKE 1 TABLET BY MOUTH EVERY DAY (Patient taking differently: Take 75 mcg by mouth daily before breakfast. )   Multiple Vitamins-Minerals  (MULTIVITAMIN ADULTS PO) Take 1 tablet by mouth daily.    No facility-administered encounter medications on file as of 07/21/2020.    Wt Readings from Last 3 Encounters:  06/23/20 159 lb 12.8 oz (72.5 kg)  05/25/20 160 lb 12.8 oz (72.9 kg)  05/14/20 163 lb 11.2 oz (74.3 kg)    Current Diagnosis/Assessment:  SDOH Interventions     Most Recent Value  SDOH Interventions  Financial Strain Interventions Intervention Not Indicated  Transportation Interventions Intervention Not Indicated      Goals Addressed            This Visit's Progress    Chronic Care Management       CARE PLAN ENTRY (see longitudinal plan of care for additional care plan information)  Current Barriers:   Chronic Disease Management support, education, and care coordination needs related to Hypertension, Chronic Kidney Disease and Hypothyroidism    Hypertension BP Readings from Last 3 Encounters:  06/23/20 (!) 164/90  05/25/20 (!) 190/64  05/14/20 (!) 180/58    Pharmacist Clinical Goal(s): o Over the next 90 days, patient will work with PharmD and providers to achieve BP goal <140/90  Current regimen:   Amlodipine 10 mg daily   Hydralazine 50 mg three times daily   HCTZ 25 mg daily   Interventions: o Discussed low salt diet and exercising as tolerated extensively o Will initiate blood pressure monitoring plan   Patient self care activities - Over the next 90 days, patient will: o Check blood pressure daily document, and provide at future appointments o Ensure daily salt intake <  2300 mg/day  Hypothyroidism  Pharmacist Clinical Goal(s) o Over the next 90 days, patient will work with PharmD and providers to maintain stable thyroid function  Current regimen:  o Levothyroxine 75 mcg daily  Interventions: o Counseled to separate from food and other medications by at least 60 minutes.  Medication management  Pharmacist Clinical Goal(s): o Over the next 90 days, patient will work with  PharmD and providers to maintain optimal medication adherence  [facility]  Interventions o Comprehensive medication review performed. o Continue current medication management strategy  Patient self care activities - Over the next 90 days, patient will: o Take medications as prescribed o Report any questions or concerns to PharmD and/or provider(s)      Hypertension   BP goal is:  <140/90  Office blood pressures are  BP Readings from Last 3 Encounters:  06/23/20 (!) 164/90  05/25/20 (!) 190/64  05/14/20 (!) 180/58   CMP Latest Ref Rng & Units 05/14/2020 05/07/2020 12/19/2019  Glucose 65 - 99 mg/dL 99 154(M) 89  BUN 7 - 25 mg/dL 21 23 08(Q)  Creatinine 0.60 - 0.88 mg/dL 7.61(P) 5.09(T) 2.67(T)  Sodium 135 - 146 mmol/L 139 139 139  Potassium 3.5 - 5.3 mmol/L 4.0 3.9 4.6  Chloride 98 - 110 mmol/L 107 106 105  CO2 20 - 32 mmol/L 21 21(L) 28  Calcium 8.6 - 10.4 mg/dL 9.5 9.5 9.5  Total Protein 6.1 - 8.1 g/dL 7.1 - -  Total Bilirubin 0.2 - 1.2 mg/dL 0.4 - -  Alkaline Phos 39 - 117 U/L - - -  AST 10 - 35 U/L 18 - -  ALT 6 - 29 U/L 8 - -   Kidney Function Lab Results  Component Value Date/Time   CREATININE 1.34 (H) 05/14/2020 03:44 PM   CREATININE 1.36 (H) 05/07/2020 07:58 PM   CREATININE 1.23 (H) 12/19/2019 02:04 PM   CREATININE 1.50 (H) 11/14/2019 03:25 PM   GFR 49.71 (L) 12/19/2019 02:04 PM   GFRNONAA 34 (L) 05/07/2020 07:58 PM   GFRAA 40 (L) 05/07/2020 07:58 PM   K 4.0 05/14/2020 03:44 PM   K 3.9 05/07/2020 07:58 PM     Patient checks BP at home 3-5x per week Patient home BP readings are ranging: 147/72, pulse 72   Patient has failed these meds in the past: Losartan, Vasartan  Patient is currently uncontrolled on the following medications:   Amlodipine 10 mg daily   Hydralazine 50 mg TID   HCTZ 25 mg daily   We discussed diet and exercise extensively. Patient reports frequent blurred vision and nausea since restarting HCTZ. It occurs frequently after  taking HCTZ and typically resolves in an hour or so. Patient previously on losartan-HCTZ. Patient does not recall any intolerances to this combination.   Plan  Recommend stopping HCTZ Recommend starting Losartan-HCTZ 50-12.5 mg daily.  Recommend rechecking BMP in 1-2 weeks after starting.   Hypothyroidism   Lab Results  Component Value Date/Time   TSH 3.93 05/14/2020 03:44 PM   TSH 5.05 (H) 11/14/2019 03:25 PM   FREET4 0.71 12/25/2018 03:16 PM    Patient has failed these meds in past: n/a Patient is currently controlled on the following medications:   Levothyroxine 75 mcg daily   We discussed:  Separates from other medications appropriately.   Plan  Continue current medications  Misc / OTC    Aspirin 325 mg daily   Vitamin D 1000 units daily   Multivitamin daily   Plan  Continue  current medications  Vaccines   Reviewed and discussed patient's vaccination history.    Immunization History  Administered Date(s) Administered   Influenza, High Dose Seasonal PF 09/04/2018, 08/01/2019   Influenza-Unspecified 08/01/2019   PFIZER SARS-COV-2 Vaccination 01/11/2020, 02/10/2020   Pneumococcal Conjugate-13 10/02/2016   Zoster 10/02/2016    Plan  Medication Management   [facility] Uses pill box? No - Doesn't need.  Pt endorses 90% compliance  Potentially interested in blister packaging. Will discuss at next visit   Plan  Continue current medication management strategy  Follow up: 3 month phone visit  Garey Ham Clinical Pharmacist [facility] [phone]

## 2020-07-20 ENCOUNTER — Telehealth: Payer: Self-pay

## 2020-07-20 NOTE — Progress Notes (Signed)
  Left a message  to confirmed patient office appointment on 07/21/2020 for CCM at 2:00 pm with Angelena Sole the Clinical pharmacist.   Everlean Cherry Clinical Pharmacist Assistant [phone]

## 2020-07-21 ENCOUNTER — Other Ambulatory Visit: Payer: Self-pay

## 2020-07-21 ENCOUNTER — Ambulatory Visit: Payer: Medicare HMO

## 2020-07-21 DIAGNOSIS — E890 Postprocedural hypothyroidism: Secondary | ICD-10-CM

## 2020-07-21 DIAGNOSIS — I10 Essential (primary) hypertension: Secondary | ICD-10-CM

## 2020-07-26 NOTE — Patient Instructions (Addendum)
Visit Information It was great speaking with you today!  Please let me know if you have any questions about our visit.  Goals Addressed            This Visit's Progress   . Chronic Care Management       CARE PLAN ENTRY (see longitudinal plan of care for additional care plan information)  Current Barriers:  . Chronic Disease Management support, education, and care coordination needs related to Hypertension, Chronic Kidney Disease and Hypothyroidism    Hypertension BP Readings from Last 3 Encounters:  06/23/20 (!) 164/90  05/25/20 (!) 190/64  05/14/20 (!) 180/58   . Pharmacist Clinical Goal(s): o Over the next 90 days, patient will work with PharmD and providers to achieve BP goal <140/90 . Current regimen:  . Amlodipine 10 mg daily  . Hydralazine 50 mg three times daily  . HCTZ 25 mg daily  . Interventions: o Discussed low salt diet and exercising as tolerated extensively o Will initiate blood pressure monitoring plan  . Patient self care activities - Over the next 90 days, patient will: o Check blood pressure daily document, and provide at future appointments o Ensure daily salt intake < 2300 mg/day  Hypothyroidism . Pharmacist Clinical Goal(s) o Over the next 90 days, patient will work with PharmD and providers to maintain stable thyroid function . Current regimen:  o Levothyroxine 75 mcg daily . Interventions: o Counseled to separate from food and other medications by at least 60 minutes.  Medication management . Pharmacist Clinical Goal(s): o Over the next 90 days, patient will work with PharmD and providers to maintain optimal medication adherence . [facility] . Interventions o Comprehensive medication review performed. o Continue current medication management strategy . Patient self care activities - Over the next 90 days, patient will: o Take medications as prescribed o Report any questions or concerns to PharmD and/or provider(s)       Linda Lucero was given information about Chronic Care Management services today including:  1. CCM service includes personalized support from designated clinical staff supervised by her physician, including individualized plan of care and coordination with other care providers 2. 24/7 contact phone numbers for assistance for urgent and routine care needs. 3. Standard insurance, coinsurance, copays and deductibles apply for chronic care management only during months in which we provide at least 20 minutes of these services. Most insurances cover these services at 100%, however patients may be responsible for any copay, coinsurance and/or deductible if applicable. This service may help you avoid the need for more expensive face-to-face services. 4. Only one practitioner may furnish and bill the service in a calendar month. 5. The patient may stop CCM services at any time (effective at the end of the month) by phone call to the office staff.  Patient agreed to services and verbal consent obtained.   The patient verbalized understanding of instructions provided today and agreed to receive a mailed copy of patient instruction and/or educational materials. Face to Face appointment with pharmacist scheduled for:  10/18/20 at 2:00 PM  Garey Ham Clinical Pharmacist [facility] [phone]  DASH Eating Plan DASH stands for "Dietary Approaches to Stop Hypertension." The DASH eating plan is a healthy eating plan that has been shown to reduce high blood pressure (hypertension). It may also reduce your risk for type 2 diabetes, heart disease, and stroke. The DASH eating plan may also help with weight loss. What are tips for following this plan?  General guidelines  Avoid eating more than 2,300 mg (milligrams) of salt (sodium) a day. If you have hypertension, you may need to reduce your sodium intake to 1,500 mg a day.  Limit alcohol intake to no more than 1 drink a day for  nonpregnant women and 2 drinks a day for men. One drink equals 12 oz of beer, 5 oz of wine, or 1 oz of hard liquor.  Work with your health care provider to maintain a healthy body weight or to lose weight. Ask what an ideal weight is for you.  Get at least 30 minutes of exercise that causes your heart to beat faster (aerobic exercise) most days of the week. Activities may include walking, swimming, or biking.  Work with your health care provider or diet and nutrition specialist (dietitian) to adjust your eating plan to your individual calorie needs. Reading food labels   Check food labels for the amount of sodium per serving. Choose foods with less than 5 percent of the Daily Value of sodium. Generally, foods with less than 300 mg of sodium per serving fit into this eating plan.  To find whole grains, look for the word "whole" as the first word in the ingredient list. Shopping  Buy products labeled as "low-sodium" or "no salt added."  Buy fresh foods. Avoid canned foods and premade or frozen meals. Cooking  Avoid adding salt when cooking. Use salt-free seasonings or herbs instead of table salt or sea salt. Check with your health care provider or pharmacist before using salt substitutes.  Do not fry foods. Cook foods using healthy methods such as baking, boiling, grilling, and broiling instead.  Cook with heart-healthy oils, such as olive, canola, soybean, or sunflower oil. Meal planning  Eat a balanced diet that includes: ? 5 or more servings of fruits and vegetables each day. At each meal, try to fill half of your plate with fruits and vegetables. ? Up to 6-8 servings of whole grains each day. ? Less than 6 oz of lean meat, poultry, or fish each day. A 3-oz serving of meat is about the same size as a deck of cards. One egg equals 1 oz. ? 2 servings of low-fat dairy each day. ? A serving of nuts, seeds, or beans 5 times each week. ? Heart-healthy fats. Healthy fats called Omega-3  fatty acids are found in foods such as flaxseeds and coldwater fish, like sardines, salmon, and mackerel.  Limit how much you eat of the following: ? Canned or prepackaged foods. ? Food that is high in trans fat, such as fried foods. ? Food that is high in saturated fat, such as fatty meat. ? Sweets, desserts, sugary drinks, and other foods with added sugar. ? Full-fat dairy products.  Do not salt foods before eating.  Try to eat at least 2 vegetarian meals each week.  Eat more home-cooked food and less restaurant, buffet, and fast food.  When eating at a restaurant, ask that your food be prepared with less salt or no salt, if possible. What foods are recommended? The items listed may not be a complete list. Talk with your dietitian about what dietary choices are best for you. Grains Whole-grain or whole-wheat bread. Whole-grain or whole-wheat pasta. Brown rice. Orpah Cobb. Bulgur. Whole-grain and low-sodium cereals. Pita bread. Low-fat, low-sodium crackers. Whole-wheat flour tortillas. Vegetables Fresh or frozen vegetables (raw, steamed, roasted, or grilled). Low-sodium or reduced-sodium tomato and vegetable juice. Low-sodium or reduced-sodium tomato sauce and tomato paste. Low-sodium or reduced-sodium  canned vegetables. Fruits All fresh, dried, or frozen fruit. Canned fruit in natural juice (without added sugar). Meat and other protein foods Skinless chicken or [place]. Ground chicken or [place]. Pork with fat trimmed off. Fish and seafood. Egg whites. Dried beans, peas, or lentils. Unsalted nuts, nut butters, and seeds. Unsalted canned beans. Lean cuts of beef with fat trimmed off. Low-sodium, lean deli meat. Dairy Low-fat (1%) or fat-free (skim) milk. Fat-free, low-fat, or reduced-fat cheeses. Nonfat, low-sodium ricotta or cottage cheese. Low-fat or nonfat yogurt. Low-fat, low-sodium cheese. Fats and oils Soft margarine without trans fats. Vegetable oil. Low-fat, reduced-fat, or  light mayonnaise and salad dressings (reduced-sodium). Canola, safflower, olive, soybean, and sunflower oils. Avocado. Seasoning and other foods Herbs. Spices. Seasoning mixes without salt. Unsalted popcorn and pretzels. Fat-free sweets. What foods are not recommended? The items listed may not be a complete list. Talk with your dietitian about what dietary choices are best for you. Grains Baked goods made with fat, such as croissants, muffins, or some breads. Dry pasta or rice meal packs. Vegetables Creamed or fried vegetables. Vegetables in a cheese sauce. Regular canned vegetables (not low-sodium or reduced-sodium). Regular canned tomato sauce and paste (not low-sodium or reduced-sodium). Regular tomato and vegetable juice (not low-sodium or reduced-sodium). Rosita Fire. Olives. Fruits Canned fruit in a light or heavy syrup. Fried fruit. Fruit in cream or butter sauce. Meat and other protein foods Fatty cuts of meat. Ribs. Fried meat. Tomasa Blase. Sausage. Bologna and other processed lunch meats. Salami. Fatback. Hotdogs. Bratwurst. Salted nuts and seeds. Canned beans with added salt. Canned or smoked fish. Whole eggs or egg yolks. Chicken or [place] with skin. Dairy Whole or 2% milk, cream, and half-and-half. Whole or full-fat cream cheese. Whole-fat or sweetened yogurt. Full-fat cheese. Nondairy creamers. Whipped toppings. Processed cheese and cheese spreads. Fats and oils Butter. Stick margarine. Lard. Shortening. Ghee. Bacon fat. Tropical oils, such as coconut, palm kernel, or palm oil. Seasoning and other foods Salted popcorn and pretzels. Onion salt, garlic salt, seasoned salt, table salt, and sea salt. Worcestershire sauce. Tartar sauce. Barbecue sauce. Teriyaki sauce. Soy sauce, including reduced-sodium. Steak sauce. Canned and packaged gravies. Fish sauce. Oyster sauce. Cocktail sauce. Horseradish that you find on the shelf. Ketchup. Mustard. Meat flavorings and tenderizers. Bouillon cubes. Hot  sauce and Tabasco sauce. Premade or packaged marinades. Premade or packaged taco seasonings. Relishes. Regular salad dressings. Where to find more information:  National Heart, Lung, and Blood Institute: PopSteam.is  American Heart Association: www.heart.org Summary  The DASH eating plan is a healthy eating plan that has been shown to reduce high blood pressure (hypertension). It may also reduce your risk for type 2 diabetes, heart disease, and stroke.  With the DASH eating plan, you should limit salt (sodium) intake to 2,300 mg a day. If you have hypertension, you may need to reduce your sodium intake to 1,500 mg a day.  When on the DASH eating plan, aim to eat more fresh fruits and vegetables, whole grains, lean proteins, low-fat dairy, and heart-healthy fats.  Work with your health care provider or diet and nutrition specialist (dietitian) to adjust your eating plan to your individual calorie needs. This information is not intended to replace advice given to you by your health care provider. Make sure you discuss any questions you have with your health care provider. Document Revised: 10/05/2017 Document Reviewed: 10/16/2016 Elsevier Patient Education  2020 [employer].

## 2020-08-11 ENCOUNTER — Encounter: Payer: Self-pay | Admitting: Family Medicine

## 2020-08-11 ENCOUNTER — Ambulatory Visit (INDEPENDENT_AMBULATORY_CARE_PROVIDER_SITE_OTHER): Payer: Medicare HMO | Admitting: Family Medicine

## 2020-08-11 ENCOUNTER — Other Ambulatory Visit: Payer: Self-pay

## 2020-08-11 VITALS — HR 82 | Temp 98.0°F | Ht 67.0 in | Wt 161.3 lb

## 2020-08-11 DIAGNOSIS — E890 Postprocedural hypothyroidism: Secondary | ICD-10-CM | POA: Diagnosis not present

## 2020-08-11 DIAGNOSIS — G4700 Insomnia, unspecified: Secondary | ICD-10-CM

## 2020-08-11 DIAGNOSIS — N289 Disorder of kidney and ureter, unspecified: Secondary | ICD-10-CM | POA: Diagnosis not present

## 2020-08-11 DIAGNOSIS — I10 Essential (primary) hypertension: Secondary | ICD-10-CM | POA: Diagnosis not present

## 2020-08-11 DIAGNOSIS — N1831 Chronic kidney disease, stage 3a: Secondary | ICD-10-CM

## 2020-08-11 LAB — TSH: TSH: 4.98 mIU/L — ABNORMAL HIGH (ref 0.40–4.50)

## 2020-08-11 MED ORDER — CLONIDINE HCL 0.1 MG PO TABS: 0.1000 mg | ORAL_TABLET | Freq: Two times a day (BID) | ORAL | 11 refills | Status: DC

## 2020-08-11 MED ORDER — TRAZODONE HCL 50 MG PO TABS
25.0000 mg | ORAL_TABLET | Freq: Every day | ORAL | 1 refills | Status: DC
Start: 2020-08-11 — End: 2021-03-29

## 2020-08-11 NOTE — Progress Notes (Signed)
Linda Lucero DOB: 02/16/1928 Encounter date: 08/11/2020  This is a 86 y.o. female who presents with Chief Complaint  Patient presents with  . Follow-up    History of present illness: Last visit was almost 2 months ago.  At that time we discussed continuing with the hydralazine 3 times daily, taking hydrochlorothiazide in the morning, taking amlodipine in the evening only.  She had been taking 10 mg twice daily (from a different provider).  Blood pressure she reported back to [place] or improved compared to that in the office.  Just not feeling well today. Just feels weak, washed out. Sometimes feels this way - maybe a week out of the month. Pressures are up when she isn't feeling well like this. Not linked with anything. No triggers like stress/exercise/etc that she knows of. Has been this way in past several months.    This morning was 162/71 w HR 73; last week was still in 160's. Lowest diastolic was 71. Gets some blurred vision after taking the hctz; doesn't last; just notes about an hour later.  Allergies  Allergen Reactions  . Diovan [Valsartan]   . Doxazosin    Current Meds  Medication Sig  . amLODipine (NORVASC) 10 MG tablet Take 1 tablet (10 mg total) by mouth at bedtime.  Marland Kitchen aspirin EC 325 MG tablet Take 1 tablet (325 mg total) by mouth daily. (Patient taking differently: Take 325 mg by mouth daily as needed. )  . cholecalciferol (VITAMIN D) 1000 units tablet Take 1 tablet (1,000 Units total) by mouth daily.  . hydrALAZINE (APRESOLINE) 50 MG tablet TAKE 1 TABLET BY MOUTH THREE TIMES A DAY WITH FOOD (Patient taking differently: Take 50 mg by mouth 3 (three) times daily. Take with food.)  . levothyroxine (SYNTHROID) 75 MCG tablet TAKE 1 TABLET BY MOUTH EVERY DAY (Patient taking differently: Take 75 mcg by mouth daily before breakfast. )  . Multiple Vitamins-Minerals (MULTIVITAMIN ADULTS PO) Take 1 tablet by mouth daily.   . [DISCONTINUED] hydrochlorothiazide (HYDRODIURIL) 25 MG  tablet Take 1 tablet (25 mg total) by mouth daily.    Review of Systems  Constitutional: Positive for fatigue. Negative for chills and fever.  Respiratory: Negative for cough, chest tightness, shortness of breath and wheezing.   Cardiovascular: Negative for chest pain, palpitations and leg swelling.  Gastrointestinal: Negative for abdominal pain, nausea and vomiting.  Neurological: Positive for weakness. Negative for dizziness, light-headedness and headaches.    Objective:  Pulse 82   Temp 98 F (36.7 C) (Oral)   Ht 5\' 7"  (1.702 m)   Wt 161 lb 4.8 oz (73.2 kg)   BMI 25.26 kg/m   Weight: 161 lb 4.8 oz (73.2 kg)   BP Readings from Last 3 Encounters:  06/23/20 (!) 164/90  05/25/20 (!) 190/64  05/14/20 (!) 180/58   Wt Readings from Last 3 Encounters:  08/11/20 161 lb 4.8 oz (73.2 kg)  06/23/20 159 lb 12.8 oz (72.5 kg)  05/25/20 160 lb 12.8 oz (72.9 kg)    Physical Exam Constitutional:      General: She is not in acute distress.    Appearance: She is well-developed.  Cardiovascular:     Rate and Rhythm: Normal rate and regular rhythm.     Heart sounds: Normal heart sounds. No murmur heard.  No friction rub.  Pulmonary:     Effort: Pulmonary effort is normal. No respiratory distress.     Breath sounds: Normal breath sounds. No wheezing or rales.  Musculoskeletal:  Right lower leg: No edema.     Left lower leg: No edema.  Neurological:     Mental Status: She is alert and oriented to person, place, and time.  Psychiatric:        Behavior: Behavior normal.     Assessment/Plan  1. Hypertension, unspecified type Blood pressure is elevated today. She does tend to have periods of spiking every few weeks. We are going to check some bloodwork today and determine next step pending these results. Additionally, I have asked her to continue with her amlodipine in the morning and continue with hydralazine 3 times daily.  We are going to stop her hydrochlorothiazide since she  has some side effects with this medication after taking it.  Going to start clonidine for her but a half a tablet twice daily with increase to a whole tablet (0.1 mg) twice daily if blood pressures are still 150 systolic or higher.  I have encouraged her not to take hydralazine and clonidine at the same times.  I selected the clonidine because she has had some intolerance to losartan in the past, although I have been unable to find the exact problem on chart review.  Check in with her before the weekend. - CBC with Differential/Platelet; Future - Basic metabolic panel; Future - Basic metabolic panel - CBC with Differential/Platelet  2. Stage 3a chronic kidney disease (HCC) She has been stable kidney function, but we will recheck today, especially since hydrochlorothiazide was recently started.  3. Postablative hypothyroidism Continue with Synthroid 75 mcg daily. - TSH; Future - TSH  4. Insomnia: At the end of her visit, we discussed how she is not having hard time since the recent death of her daughter.  She feels she cannot sleep and continues to think about her daughter's death when she is lying in bed.  She is only sleeping for couple hours at night and feels that this is wearing on her emotionally and physically.  She has not had any success with melatonin that she is tried.  Going to try low-dose of trazodone to see if this will help her fall asleep.  She does not want to take something long-term but I think in the short-term to help reset her sleep cycle this may be helpful. Discussed new medication(s) today with patient. Discussed potential side effects and patient verbalized understanding.   Return in about 2 months (around 10/11/2020).        Theodis Shove, MD

## 2020-08-11 NOTE — Patient Instructions (Addendum)
Continue with the amlodipine in the morning and the hydralazine three times daily.   We are going to stop the hydrochlorothiazide.   We are going to start the clonidine and I would like you to take a half tablet twice daily to start (half of the 0.1mg  tablet) and if tolerating and blood pressure is still 150 or higher, go ahead and take full tablet twice daily.   I would suggest spacing out the hydralazine and the clonidine dosing.   For tonight; ok to take the hydralazine at 4:30pm like normal and then take the clonidine at bedtime.   Trial of the trazodone 1/2 tablet for sleep. Take this 30 minutes prior to bedtime.

## 2020-08-12 LAB — CBC WITH DIFFERENTIAL/PLATELET
Absolute Monocytes: 615 cells/uL (ref 200–950)
Basophils Absolute: 82 cells/uL (ref 0–200)
Basophils Relative: 1 %
Eosinophils Absolute: 49 cells/uL (ref 15–500)
Eosinophils Relative: 0.6 %
HCT: 38.7 % (ref 35.0–45.0)
Hemoglobin: 11.8 g/dL (ref 11.7–15.5)
Lymphs Abs: 2353 cells/uL (ref 850–3900)
MCH: 21.5 pg — ABNORMAL LOW (ref 27.0–33.0)
MCHC: 30.5 g/dL — ABNORMAL LOW (ref 32.0–36.0)
MCV: 70.5 fL — ABNORMAL LOW (ref 80.0–100.0)
Monocytes Relative: 7.5 %
Neutro Abs: 5100 cells/uL (ref 1500–7800)
Neutrophils Relative %: 62.2 %
Platelets: 333 10*3/uL (ref 140–400)
RBC: 5.49 10*6/uL — ABNORMAL HIGH (ref 3.80–5.10)
RDW: 20.2 % — ABNORMAL HIGH (ref 11.0–15.0)
Total Lymphocyte: 28.7 %
WBC: 8.2 10*3/uL (ref 3.8–10.8)

## 2020-08-12 LAB — BASIC METABOLIC PANEL
BUN/Creatinine Ratio: 14 (calc) (ref 6–22)
BUN: 28 mg/dL — ABNORMAL HIGH (ref 7–25)
CO2: 20 mmol/L (ref 20–32)
Calcium: 10.2 mg/dL (ref 8.6–10.4)
Chloride: 103 mmol/L (ref 98–110)
Creat: 1.98 mg/dL — ABNORMAL HIGH (ref 0.60–0.88)
Glucose, Bld: 99 mg/dL (ref 65–99)
Potassium: 3.9 mmol/L (ref 3.5–5.3)
Sodium: 137 mmol/L (ref 135–146)

## 2020-08-20 ENCOUNTER — Other Ambulatory Visit: Payer: Medicare HMO

## 2020-08-20 ENCOUNTER — Other Ambulatory Visit: Payer: Self-pay

## 2020-08-20 DIAGNOSIS — N289 Disorder of kidney and ureter, unspecified: Secondary | ICD-10-CM | POA: Diagnosis not present

## 2020-08-21 LAB — BASIC METABOLIC PANEL
BUN/Creatinine Ratio: 17 (calc) (ref 6–22)
BUN: 26 mg/dL — ABNORMAL HIGH (ref 7–25)
CO2: 22 mmol/L (ref 20–32)
Calcium: 9.5 mg/dL (ref 8.6–10.4)
Chloride: 110 mmol/L (ref 98–110)
Creat: 1.49 mg/dL — ABNORMAL HIGH (ref 0.60–0.88)
Glucose, Bld: 112 mg/dL — ABNORMAL HIGH (ref 65–99)
Potassium: 4.2 mmol/L (ref 3.5–5.3)
Sodium: 140 mmol/L (ref 135–146)

## 2020-08-22 ENCOUNTER — Other Ambulatory Visit: Payer: Self-pay | Admitting: Family Medicine

## 2020-08-24 ENCOUNTER — Telehealth: Payer: Self-pay

## 2020-08-24 NOTE — Progress Notes (Signed)
Chronic Care Management Pharmacy Assistant   Name: Linda Lucero  MRN: [id number] DOB: 08/18/1928  Reason for Encounter:Hypertension  Disease State call. \ PCP : Wynn Banker, MD  Allergies:   Allergies  Allergen Reactions  . Diovan [Valsartan]   . Doxazosin     Medications: Outpatient Encounter Medications as of 08/24/2020  Medication Sig  . amLODipine (NORVASC) 10 MG tablet Take 1 tablet (10 mg total) by mouth at bedtime.  Marland Kitchen aspirin EC 325 MG tablet Take 1 tablet (325 mg total) by mouth daily. (Patient taking differently: Take 325 mg by mouth daily as needed. )  . cholecalciferol (VITAMIN D) 1000 units tablet Take 1 tablet (1,000 Units total) by mouth daily.  . cloNIDine (CATAPRES) 0.1 MG tablet Take 1 tablet (0.1 mg total) by mouth 2 (two) times daily.  . hydrALAZINE (APRESOLINE) 50 MG tablet TAKE 1 TABLET BY MOUTH THREE TIMES A DAY WITH FOOD (Patient taking differently: Take 50 mg by mouth 3 (three) times daily. Take with food.)  . levothyroxine (SYNTHROID) 75 MCG tablet TAKE 1 TABLET BY MOUTH EVERY DAY (Patient taking differently: Take 75 mcg by mouth daily before breakfast. )  . Multiple Vitamins-Minerals (MULTIVITAMIN ADULTS PO) Take 1 tablet by mouth daily.   . traZODone (DESYREL) 50 MG tablet Take 0.5 tablets (25 mg total) by mouth at bedtime.   No facility-administered encounter medications on file as of 08/24/2020.    Current Diagnosis: Patient Active Problem List   Diagnosis Date Noted  . Low back pain 09/06/2018  . Hypertension 09/06/2018  . Chronic kidney disease (CKD), stage III (moderate) (HCC) 09/06/2018  . Hypothyroidism 09/06/2018    Follow-Up:  Pharmacist Review   Reviewed chart prior to disease state call. Spoke with patient regarding BP  Recent Office Vitals: BP Readings from Last 3 Encounters:  06/23/20 (!) 164/90  05/25/20 (!) 190/64  05/14/20 (!) 180/58   Pulse Readings from Last 3 Encounters:  08/11/20 82  06/23/20 78    05/25/20 76    Wt Readings from Last 3 Encounters:  08/11/20 161 lb 4.8 oz (73.2 kg)  06/23/20 159 lb 12.8 oz (72.5 kg)  05/25/20 160 lb 12.8 oz (72.9 kg)     Kidney Function Lab Results  Component Value Date/Time   CREATININE 1.49 (H) 08/20/2020 02:26 PM   CREATININE 1.98 (H) 08/11/2020 03:19 PM   GFR 49.71 (L) 12/19/2019 02:04 PM   GFRNONAA 34 (L) 05/07/2020 07:58 PM   GFRAA 40 (L) 05/07/2020 07:58 PM    BMP Latest Ref Rng & Units 08/20/2020 08/11/2020 05/14/2020  Glucose 65 - 99 mg/dL 650(P) 99 99  BUN 7 - 25 mg/dL 54(S) 56(C) 21  Creatinine 0.60 - 0.88 mg/dL 1.27(N) 1.70(Y) 1.74(B)  BUN/Creat Ratio 6 - 22 (calc) 17 14 16   Sodium 135 - 146 mmol/L 140 137 139  Potassium 3.5 - 5.3 mmol/L 4.2 3.9 4.0  Chloride 98 - 110 mmol/L 110 103 107  CO2 20 - 32 mmol/L 22 20 21   Calcium 8.6 - 10.4 mg/dL 9.5 9.5    . Current antihypertensive regimen:   Hydralazine 50 mg three times daily  o Clonidine 0.1 mg two Daily o  Amlodipine.10MG  Daily  . How often are you checking your Blood Pressure? daily . Current home BP readings:  - Patient states she thinks her blood pressure machine is not accurate.Patient reports when she checks her blood pressure in the morning around 10:00 am it is around 160's/80's ,and two hours  after she takes her medications , it is around 140's/80's.During the day she will check her blood pressure and it is the same reading as it was the second time she check her blood pressure. - Patient states she think she throw her blood pressure log away when she was cleaning.Patient states she will call within a week to give me her current blood pressure readings. . What recent interventions/DTPs have been made by any provider to improve Blood Pressure control since last CPP Visit:  o PCP started Clonidine 0.1 MG  . Any recent hospitalizations or ED visits since last visit with CPP? No . What diet changes have been made to improve Blood Pressure Control?  o Patient  states she cooks at home . Mix vegetables,stew beef ,salt for taste. . What exercise is being done to improve your Blood Pressure Control?  o Patient states she does not exercise due to having shortness of breath.  Adherence Review: Is the patient currently on ACE/ARB medication? No Does the patient have >5 day gap between last estimated fill dates? Yes   Valora Piccolo Clinical Pharmacist Assistant [phone]

## 2020-09-23 DIAGNOSIS — H2512 Age-related nuclear cataract, left eye: Secondary | ICD-10-CM | POA: Diagnosis not present

## 2020-09-23 DIAGNOSIS — H2513 Age-related nuclear cataract, bilateral: Secondary | ICD-10-CM | POA: Diagnosis not present

## 2020-09-23 DIAGNOSIS — H2589 Other age-related cataract: Secondary | ICD-10-CM | POA: Diagnosis not present

## 2020-09-23 DIAGNOSIS — H47021 Hemorrhage in optic nerve sheath, right eye: Secondary | ICD-10-CM | POA: Diagnosis not present

## 2020-09-23 DIAGNOSIS — H524 Presbyopia: Secondary | ICD-10-CM | POA: Diagnosis not present

## 2020-09-23 DIAGNOSIS — H43813 Vitreous degeneration, bilateral: Secondary | ICD-10-CM | POA: Diagnosis not present

## 2020-10-05 DIAGNOSIS — H2512 Age-related nuclear cataract, left eye: Secondary | ICD-10-CM | POA: Diagnosis not present

## 2020-10-05 DIAGNOSIS — H25812 Combined forms of age-related cataract, left eye: Secondary | ICD-10-CM | POA: Diagnosis not present

## 2020-10-14 ENCOUNTER — Telehealth: Payer: Self-pay | Admitting: Pharmacist

## 2020-10-14 NOTE — Chronic Care Management (AMB) (Signed)
Chronic Care Management Pharmacy Assistant   Name: Linda Lucero  MRN: [id number] DOB: 1927-12-06  Reason for Encounter: Disease State /Hypertension Adherence Call  PCP : Wynn Banker, MD  Allergies:   Allergies  Allergen Reactions   Diovan [Valsartan]    Doxazosin     Medications: Outpatient Encounter Medications as of 10/14/2020  Medication Sig   amLODipine (NORVASC) 10 MG tablet Take 1 tablet (10 mg total) by mouth at bedtime.   aspirin EC 325 MG tablet Take 1 tablet (325 mg total) by mouth daily. (Patient taking differently: Take 325 mg by mouth daily as needed. )   cholecalciferol (VITAMIN D) 1000 units tablet Take 1 tablet (1,000 Units total) by mouth daily.   cloNIDine (CATAPRES) 0.1 MG tablet Take 1 tablet (0.1 mg total) by mouth 2 (two) times daily.   hydrALAZINE (APRESOLINE) 50 MG tablet TAKE 1 TABLET BY MOUTH THREE TIMES A DAY WITH FOOD (Patient taking differently: Take 50 mg by mouth 3 (three) times daily. Take with food.)   levothyroxine (SYNTHROID) 75 MCG tablet TAKE 1 TABLET BY MOUTH EVERY DAY (Patient taking differently: Take 75 mcg by mouth daily before breakfast. )   Multiple Vitamins-Minerals (MULTIVITAMIN ADULTS PO) Take 1 tablet by mouth daily.    traZODone (DESYREL) 50 MG tablet Take 0.5 tablets (25 mg total) by mouth at bedtime.   No facility-administered encounter medications on file as of 10/14/2020.    Current Diagnosis: Patient Active Problem List   Diagnosis Date Noted   Low back pain 09/06/2018   Hypertension 09/06/2018   Chronic kidney disease (CKD), stage III (moderate) (HCC) 09/06/2018   Hypothyroidism 09/06/2018    Goals Addressed   None    Reviewed chart prior to disease state call. Spoke with patient regarding BP  Recent Office Vitals: BP Readings from Last 3 Encounters:  06/23/20 (!) 164/90  05/25/20 (!) 190/64  05/14/20 (!) 180/58   Pulse Readings from Last 3 Encounters:  08/11/20 82  06/23/20 78   05/25/20 76    Wt Readings from Last 3 Encounters:  08/11/20 161 lb 4.8 oz (73.2 kg)  06/23/20 159 lb 12.8 oz (72.5 kg)  05/25/20 160 lb 12.8 oz (72.9 kg)     Kidney Function Lab Results  Component Value Date/Time   CREATININE 1.49 (H) 08/20/2020 02:26 PM   CREATININE 1.98 (H) 08/11/2020 03:19 PM   GFR 49.71 (L) 12/19/2019 02:04 PM   GFRNONAA 34 (L) 05/07/2020 07:58 PM   GFRAA 40 (L) 05/07/2020 07:58 PM    BMP Latest Ref Rng & Units 08/20/2020 08/11/2020 05/14/2020  Glucose 65 - 99 mg/dL 170(Y) 99 99  BUN 7 - 25 mg/dL 17(C) 94(W) 21  Creatinine 0.60 - 0.88 mg/dL 9.67(R) 9.16(B) 8.46(K)  BUN/Creat Ratio 6 - 22 (calc) 17 14 16   Sodium 135 - 146 mmol/L 140 137 139  Potassium 3.5 - 5.3 mmol/L 4.2 3.9 4.0  Chloride 98 - 110 mmol/L 110 103 107  CO2 20 - 32 mmol/L 22 20 21   Calcium 8.6 - 10.4 mg/dL 9.5 9.5     Current antihypertensive regimen:   Amlodipine (NORVASC) 10 mg tablet at bedtime  Hydralazine (APRESOLINE) 50 mg three times a day   How often are you checking your Blood Pressure? 3-5x per week  Current home BP readings: 150s/90s  What recent interventions/DTPs have been made by any provider to improve Blood Pressure control since last CPP Visit: None  Any recent hospitalizations or ED visits since last  visit with CPP? No  What diet changes have been made to improve Blood Pressure Control?  o She continues to not to eats fast food  What exercise is being done to improve your Blood Pressure Control?  o She does her house work  Adherence Review: Is the patient currently on ACE/ARB medication? No Does the patient have >5 day gap between last estimated fill dates? No  Follow-Up:  Pharmacist Review   She states that she feels better today. Her daughter had recently passed. She has noticed that her blood pressure has been going down. She also has had cataract surgery on her left eye. She is not having any issues with her medications or her current  pharmacy.  Berenice Bouton, [facility] Clinical Pharmacy Assistant [phone]

## 2020-10-18 ENCOUNTER — Ambulatory Visit: Payer: Medicare HMO

## 2020-10-23 ENCOUNTER — Other Ambulatory Visit: Payer: Self-pay | Admitting: Family Medicine

## 2020-10-27 ENCOUNTER — Ambulatory Visit (INDEPENDENT_AMBULATORY_CARE_PROVIDER_SITE_OTHER): Payer: Medicare HMO | Admitting: Family Medicine

## 2020-10-27 ENCOUNTER — Other Ambulatory Visit: Payer: Self-pay

## 2020-10-27 ENCOUNTER — Encounter: Payer: Self-pay | Admitting: Family Medicine

## 2020-10-27 VITALS — HR 82 | Temp 98.4°F | Ht 67.0 in | Wt 160.1 lb

## 2020-10-27 DIAGNOSIS — M1990 Unspecified osteoarthritis, unspecified site: Secondary | ICD-10-CM | POA: Diagnosis not present

## 2020-10-27 DIAGNOSIS — I10 Essential (primary) hypertension: Secondary | ICD-10-CM

## 2020-10-27 DIAGNOSIS — Z23 Encounter for immunization: Secondary | ICD-10-CM

## 2020-10-27 DIAGNOSIS — R432 Parageusia: Secondary | ICD-10-CM | POA: Diagnosis not present

## 2020-10-27 MED ORDER — HYDRALAZINE HCL 50 MG PO TABS: 50.0000 mg | ORAL_TABLET | Freq: Three times a day (TID) | ORAL | 1 refills | Status: DC

## 2020-10-27 MED ORDER — CLONIDINE HCL 0.1 MG PO TABS: 0.1000 mg | ORAL_TABLET | Freq: Two times a day (BID) | ORAL | 1 refills | Status: DC

## 2020-10-27 MED ORDER — DICLOFENAC SODIUM 1 % EX GEL: 2.0000 g | Freq: Four times a day (QID) | CUTANEOUS | 2 refills | Status: DC

## 2020-10-27 NOTE — Patient Instructions (Signed)
Consider different arthritis creams: blu emu for example; salonpas

## 2020-10-27 NOTE — Progress Notes (Signed)
[name] DOB: Oct 15, 1928 Encounter date: 10/27/2020  This is a 86 y.o. female who presents with Chief Complaint  Patient presents with  . Follow-up    History of present illness: She forgot to take her bp medication this morning. She got phone call and got distracted. Doesn't often forget.   Numbers had been doing better at home. Yesterday evening 145/67 w HR 69. This is pretty close to where she has been at home. Not light headed, dizzy. No headaches.   Having some right hip pain; arthritis makes her feel run. She will get in shower and use very hot water. Takes aspirin if very bad; but not often. Acts up when she does more than she should do. Aspirin does help.   Left arm goes numb at night; improves with change in position.   Allergies  Allergen Reactions  . Diovan [Valsartan]   . Doxazosin    Current Meds  Medication Sig  . amLODipine (NORVASC) 10 MG tablet Take 1 tablet (10 mg total) by mouth at bedtime.  Marland Kitchen aspirin EC 325 MG tablet Take 1 tablet (325 mg total) by mouth daily. (Patient taking differently: Take 325 mg by mouth daily as needed.)  . cholecalciferol (VITAMIN D) 1000 units tablet Take 1 tablet (1,000 Units total) by mouth daily.  . cloNIDine (CATAPRES) 0.1 MG tablet Take 1 tablet (0.1 mg total) by mouth 2 (two) times daily.  . hydrALAZINE (APRESOLINE) 50 MG tablet TAKE 1 TABLET BY MOUTH THREE TIMES A DAY WITH FOOD (Patient taking differently: Take 50 mg by mouth 3 (three) times daily. Take with food.)  . levothyroxine (SYNTHROID) 75 MCG tablet TAKE 1 TABLET BY MOUTH EVERY DAY  . Multiple Vitamins-Minerals (MULTIVITAMIN ADULTS PO) Take 1 tablet by mouth daily.   . traZODone (DESYREL) 50 MG tablet Take 0.5 tablets (25 mg total) by mouth at bedtime.    Review of Systems  Constitutional: Negative for chills, fatigue and fever.  Respiratory: Negative for cough, chest tightness, shortness of breath and wheezing.   Cardiovascular: Negative for chest pain,  palpitations and leg swelling.    Objective:  Pulse 82   Temp 98.4 F (36.9 C) (Oral)   Ht 5\' 7"  (1.702 m)   Wt 160 lb 1.6 oz (72.6 kg)   BMI 25.08 kg/m   Weight: 160 lb 1.6 oz (72.6 kg)   BP Readings from Last 3 Encounters:  06/23/20 (!) 164/90  05/25/20 (!) 190/64  05/14/20 (!) 180/58   Wt Readings from Last 3 Encounters:  10/27/20 160 lb 1.6 oz (72.6 kg)  08/11/20 161 lb 4.8 oz (73.2 kg)  06/23/20 159 lb 12.8 oz (72.5 kg)    Physical Exam Constitutional:      General: She is not in acute distress.    Appearance: She is well-developed.  Cardiovascular:     Rate and Rhythm: Normal rate and regular rhythm.     Heart sounds: Murmur heard.   Systolic murmur is present with a grade of 2/6. No friction rub.  Pulmonary:     Effort: Pulmonary effort is normal. No respiratory distress.     Breath sounds: Normal breath sounds. No wheezing or rales.  Musculoskeletal:     Right lower leg: No edema.     Left lower leg: No edema.  Neurological:     Mental Status: She is alert and oriented to person, place, and time.  Psychiatric:        Behavior: Behavior normal.     Assessment/Plan  1. Hypertension, unspecified type Blood pressure is elevated today, but she did not take her medications this morning.  Her home blood pressures are steadily in the 140 systolic over 60s to 70s.  She feels well at this level and unwell if we lower blood pressure any further.  Going to continue her current medications including her hydralazine 50 mg 3 times a day and her clonidine 0.1 mg twice a day.   2. Loss of taste This is been progressing over the last year.  She can taste sweet versus salty, but does not have much other distinction besides that.  Smell is also declining.  While this may be age-related change, we did discuss evaluating a few other labs to make sure this is not something that we could augment.  He does not really wish to see a specialist for this issue, even if lab work is not  revealing. - TSH; Future - T4, free; Future - T3, free; Future - Vitamin B12; Future - Magnesium, RBC; Future - Ceruloplasmin; Future - Lead, blood; Future - Zinc; Future  3. Arthritis Trial of topical Voltaren gel.  She prefers to avoid oral medications if possible.  She does not wish to try stronger pain medications.  She is bothered on daily basis for arthritis however.  - diclofenac Sodium (VOLTAREN) 1 % GEL; Apply 2 g topically 4 (four) times daily.  Dispense: 100 g; Refill: 2       Theodis Shove, MD

## 2020-10-29 LAB — MAGNESIUM, RBC: Magnesium RBC: 6.4 mg/dL (ref 4.0–6.4)

## 2020-11-02 LAB — CERULOPLASMIN: Ceruloplasmin: 32 mg/dL (ref 18–53)

## 2020-11-02 LAB — T4, FREE: Free T4: 1.2 ng/dL (ref 0.8–1.8)

## 2020-11-02 LAB — LEAD, BLOOD (ADULT >= 16 YRS): Lead: 2 ug/dL (ref <5)

## 2020-11-02 LAB — TSH: TSH: 4.35 mIU/L (ref 0.40–4.50)

## 2020-11-02 LAB — VITAMIN B12: Vitamin B-12: 332 pg/mL (ref 200–1100)

## 2020-11-02 LAB — ZINC: Zinc: 48 ug/dL — ABNORMAL LOW (ref 60–130)

## 2020-11-02 LAB — T3, FREE: T3, Free: 2.5 pg/mL (ref 2.3–4.2)

## 2020-11-08 DIAGNOSIS — H2511 Age-related nuclear cataract, right eye: Secondary | ICD-10-CM | POA: Diagnosis not present

## 2020-11-08 DIAGNOSIS — H2589 Other age-related cataract: Secondary | ICD-10-CM | POA: Diagnosis not present

## 2020-11-08 DIAGNOSIS — H25011 Cortical age-related cataract, right eye: Secondary | ICD-10-CM | POA: Diagnosis not present

## 2020-11-24 ENCOUNTER — Ambulatory Visit: Payer: Medicare HMO

## 2020-12-30 ENCOUNTER — Ambulatory Visit (INDEPENDENT_AMBULATORY_CARE_PROVIDER_SITE_OTHER): Payer: Medicare HMO | Admitting: Pharmacist

## 2020-12-30 ENCOUNTER — Other Ambulatory Visit: Payer: Self-pay

## 2020-12-30 DIAGNOSIS — I10 Essential (primary) hypertension: Secondary | ICD-10-CM | POA: Diagnosis not present

## 2020-12-30 DIAGNOSIS — N1831 Chronic kidney disease, stage 3a: Secondary | ICD-10-CM

## 2020-12-30 NOTE — Progress Notes (Signed)
Chronic Care Management Pharmacy Note  01/03/2021 Name:  Linda Lucero MRN:  [id number] DOB:  1928/01/29  Subjective: Linda Lucero is an 87 y.o. year old female who is a primary patient of Koberlein, Steele Berg, MD.  The CCM team was consulted for assistance with disease management and care coordination needs.    Engaged with patient face to face for follow up visit in response to provider referral for pharmacy case management and/or care coordination services.   Consent to Services:  The patient was given information about Chronic Care Management services, agreed to services, and gave verbal consent prior to initiation of services.  Please see initial visit note for detailed documentation.   Patient Care Team: Caren Macadam, MD as PCP - General (Family Medicine) Viona Gilmore, [facility] as Pharmacist (Pharmacist)  Recent office visits: 10/27/20 Micheline Rough, MD: Patient presented for chronic conditions follow up. Recommended zinc 50 mg/day as zinc level was low. Prescribed diclofenac gel for arthritis.  08/11/20 Micheline Rough, MD: Patient presented for HTN follow up. Stopped HCTZ and started clonidine 1/2 tablet BID and trial of trazodone 1/2 tablet for sleep.  06/23/20: Patient presented to Dr. Ethlyn Gallery for follow-up. BP in clinic 164/90. Home BP 140-150. Patient was taking amlodipine 10 mg twice daily, decreased to 10 mg daily.   Recent consult visits: 05/07/20: Patient presented to ED for hypertensive urgency. Patient only taking hydralazine twice daily instead of TID.   Hospital visits: None in previous 6 months  Objective:  Lab Results  Component Value Date   CREATININE 1.49 (H) 08/20/2020   BUN 26 (H) 08/20/2020   GFR 49.71 (L) 12/19/2019   GFRNONAA 34 (L) 05/07/2020   GFRAA 40 (L) 05/07/2020   NA 140 08/20/2020   K 4.2 08/20/2020   CALCIUM 9.5 08/20/2020   CO2 22 08/20/2020    Lab Results  Component Value Date/Time   GFR 49.71 (L) 12/19/2019 02:04  PM   GFR 53.24 (L) 07/08/2019 02:48 PM    Last diabetic Eye exam: No results found for: HMDIABEYEEXA  Last diabetic Foot exam: No results found for: HMDIABFOOTEX   Lab Results  Component Value Date   CHOL 222 (H) 05/14/2020   HDL 93 05/14/2020   LDLCALC 113 (H) 05/14/2020   TRIG 75 05/14/2020   CHOLHDL 2.4 05/14/2020    Hepatic Function Latest Ref Rng & Units 05/14/2020 11/14/2019 12/25/2018  Total Protein 6.1 - 8.1 g/dL 7.1 7.6 7.7  Albumin 3.5 - 5.2 g/dL - - 4.4  AST 10 - 35 U/L _0 ALT 6 - 29 U/L _1 Alk Phosphatase 39 - 117 U/L - - 86  Total Bilirubin 0.2 - 1.2 mg/dL 0.4 0.3 0.4    Lab Results  Component Value Date/Time   TSH 4.35 10/27/2020 04:13 PM   TSH 4.98 (H) 08/11/2020 03:18 PM   FREET4 1.2 10/27/2020 04:13 PM   FREET4 0.71 12/25/2018 03:16 PM    CBC Latest Ref Rng & Units 08/11/2020 05/07/2020 11/14/2019  WBC 3.8 - 10.8 Thousand/uL 8.2 7.8 8.1  Hemoglobin 11.7 - 15.5 g/dL 11.8 11.5(L) 10.5(L)  Hematocrit 35.0 - 45.0 % 38.7 35.1(L) 34.2(L)  Platelets 140 - 400 Thousand/uL 333 267 313    No results found for: VD25OH  Clinical ASCVD: No  The ASCVD Risk score Mikey Bussing DC Jr., et al., 2013) failed to calculate for the following reasons:   The 2013 ASCVD risk score is only valid for ages 53 to 79  Depression screen [facility] 2/9 05/25/2020 05/14/2020 12/25/2018  Decreased Interest 0 1 1  Down, Depressed, Hopeless 0 3 1  PHQ - 2 Score 0 4 2  Altered sleeping - - 1  Tired, decreased energy - - 1  Change in appetite - - 1  Feeling bad or failure about yourself  - - 1  Trouble concentrating - - 0  Moving slowly or fidgety/restless - - 0  Suicidal thoughts - - 0  PHQ-9 Score - - 6      Social History   Tobacco Use  Smoking Status Former Smoker  . Types: Cigarettes  Smokeless Tobacco Never Used   BP Readings from Last 3 Encounters:  06/23/20 (!) 164/90  05/25/20 (!) 190/64  05/14/20 (!) 180/58   Pulse Readings from Last 3 Encounters:  10/27/20 82   08/11/20 82  06/23/20 78   Wt Readings from Last 3 Encounters:  10/27/20 160 lb 1.6 oz (72.6 kg)  08/11/20 161 lb 4.8 oz (73.2 kg)  06/23/20 159 lb 12.8 oz (72.5 kg)    Assessment/Interventions: Review of patient past medical history, allergies, medications, health status, including review of consultants reports, laboratory and other test data, was performed as part of comprehensive evaluation and provision of chronic care management services.   SDOH:  (Social Determinants of Health) assessments and interventions performed: No   CCM Care Plan  Allergies  Allergen Reactions  . Diovan [Valsartan]   . Doxazosin     Medications Reviewed Today    Reviewed by Agnes Lawrence, CMA (Certified Medical Assistant) on 10/27/20 at 36  Med List Status: <None>  Medication Order Taking? Sig Documenting Provider Last Dose Status Informant  amLODipine (NORVASC) 10 MG tablet [id number] Yes Take 1 tablet (10 mg total) by mouth at bedtime. Caren Macadam, MD Taking Active   aspirin EC 325 MG tablet [id number] Yes Take 1 tablet (325 mg total) by mouth daily.  Patient taking differently: Take 325 mg by mouth daily as needed.   Caren Macadam, MD Taking Active Self  cholecalciferol (VITAMIN D) 1000 units tablet [id number] Yes Take 1 tablet (1,000 Units total) by mouth daily. Caren Macadam, MD Taking Active Self  cloNIDine (CATAPRES) 0.1 MG tablet [id number] Yes Take 1 tablet (0.1 mg total) by mouth 2 (two) times daily. Caren Macadam, MD Taking Active   hydrALAZINE (APRESOLINE) 50 MG tablet [id number] Yes TAKE 1 TABLET BY MOUTH THREE TIMES A DAY WITH FOOD  Patient taking differently: Take 50 mg by mouth 3 (three) times daily. Take with food.   Caren Macadam, MD Taking Active Self  levothyroxine (SYNTHROID) 75 MCG tablet [id number] Yes TAKE 1 TABLET BY MOUTH EVERY DAY Koberlein, Junell C, MD Taking Active   Multiple Vitamins-Minerals (MULTIVITAMIN ADULTS PO) [id number] Yes Take 1  tablet by mouth daily.  [provider] Taking Active Self  traZODone (DESYREL) 50 MG tablet [id number] Yes Take 0.5 tablets (25 mg total) by mouth at bedtime. Caren Macadam, MD Taking Active           Patient Active Problem List   Diagnosis Date Noted  . Low back pain 09/06/2018  . Hypertension 09/06/2018  . Chronic kidney disease (CKD), stage III (moderate) ([place]) 09/06/2018  . Hypothyroidism 09/06/2018    Immunization History  Administered Date(s) Administered  . Fluad Quad(high Dose 65+) 10/27/2020  . Influenza, High Dose Seasonal PF 09/04/2018, 08/01/2019  . Influenza-Unspecified 08/01/2019  . PFIZER(Purple Top)SARS-COV-2 Vaccination 01/11/2020, 02/10/2020  . Pneumococcal Conjugate-13 10/02/2016  .  Zoster 10/02/2016    Conditions to be addressed/monitored:  Hypertension, Chronic Kidney Disease, Hypothyroidism, Osteoarthritis and Insomnia  Care Plan : [place]  Updates made by Viona Gilmore, [place] since 01/03/2021 12:00 AM    Problem: Problem: Hypertension, Chronic Kidney Disease, Hypothyroidism, Osteoarthritis and Insomnia     Long-Range Goal: Patient-Specific Goal   Start Date: 12/30/2020  Expected End Date: 12/30/2021  This Visit's Progress: On track  Priority: High  Note:   Current Barriers:  . Unable to independently monitor therapeutic efficacy . Unable to achieve control of blood pressure   Pharmacist Clinical Goal(s):  Marland Kitchen Over the next 90 days, patient will achieve control of hypertension as evidenced by blood pressure readings  through collaboration with PharmD and provider.   Interventions: . 1:1 collaboration with Caren Macadam, MD regarding development and update of comprehensive plan of care as evidenced by provider attestation and co-signature . Inter-disciplinary care team collaboration (see longitudinal plan of care) . Comprehensive medication review performed; medication list updated in electronic medical  record  Hypertension (BP goal <140/90) -Uncontrolled -Current treatment:  Amlodipine 10 mg daily   Hydralazine 50 mg three times daily  Clonidine 0.1 mg one tablet twice daily -Medications previously tried: HCTZ (side effects), losartan (unknown), valsartan -Current home readings:  203/92 (before meds) that afternoon came down to 170 -Current dietary habits: doesn't eat a lot of salt when cooking and does not eat out; doesn't eat processed foods and drinks 4 glasses of water per day -Current exercise habits: does not sit down all day but no structured exercise -Denies hypotensive/hypertensive symptoms -Educated on Importance of home blood pressure monitoring; Proper BP monitoring technique; Symptoms of hypotension and importance of maintaining adequate hydration; -Counseled to monitor BP at home twice weekly, document, and provide log at future appointments -Recommended to continue current medication Collaborated with PCP to adjust blood pressure medications  Insomnia (Goal: improve quality and quantity of sleep) -Controlled -Current treatment  . Trazodone 50 mg 1/2 tablet daily as needed -Medications previously tried: none  -Recommended to continue current medication Counseled on practicing good sleep hygiene by setting a sleep schedule and maintaining it, avoid excessive napping, following a nightly routine, avoiding screen time for 30-60 minutes before going to bed, and making the bedroom a cool, quiet and dark space  Hypothyroidism (Goal: TSH 0.35-4.5) -Controlled -Current treatment  . Levothyroxine 75 mcg 1 tablet daily -Medications previously tried: none  -Recommended to continue current medication   Osteoarthritis (Goal: minimize pain associated with arthritis) -Controlled -Current treatment  . Diclofenac 1% gel apply as needed . Aspirin 325 mg daily   . Tylenol as needed -Medications previously tried: none  -Counseled on risk of bleeding with aspirin and kidney  damage Recommended use of Tylenol more often than aspirin   CKD (Goal: prevent further worsening of kidneys) -Controlled -Current treatment  . No medications -Medications previously tried: losartan, valsartan  -Counseled on avoidance of NSAIDs for worsening kidney function   Health Maintenance -Vaccine gaps: tetanus -Current therapy:  . Zinc 50 mg daily   Vitamin D 1000 units daily   Multivitamin daily  -Educated on Cost vs benefit of each product must be carefully weighed by individual consumer -Patient is satisfied with current therapy and denies issues -Recommended to continue current medication  Patient Goals/Self-Care Activities . Over the next 90 days, patient will:  - take medications as prescribed check blood pressure twice weekly, document, and provide at future appointments  Follow Up Plan: Face  to Face appointment with care management team member scheduled for:  The care management team will reach out to the patient again over the next 21 days.        Medication Assistance: None required.  Patient affirms current coverage meets needs.  Patient's [facility] pharmacy [facility]:  [id number] [place] [id number]: [phone]: [phone] Uses pill box? No - different timing of medications Pt endorses 99% compliance  We discussed: Benefits of medication synchronization, packaging and delivery as well as enhanced pharmacist oversight with [employer]. Patient decided to: Continue current medication management strategy  Care Plan and Follow Up Patient Decision:  Patient agrees to Care Plan and Follow-up.  Plan: Face to Face appointment with care management team member scheduled for: 90 days    Not interested

## 2021-01-07 ENCOUNTER — Other Ambulatory Visit: Payer: Self-pay | Admitting: Family Medicine

## 2021-01-07 ENCOUNTER — Telehealth: Payer: Self-pay | Admitting: Pharmacist

## 2021-01-07 MED ORDER — LISINOPRIL 10 MG PO TABS: 10.0000 mg | ORAL_TABLET | Freq: Every day | ORAL | 3 refills | Status: DC

## 2021-01-07 NOTE — Telephone Encounter (Signed)
Called patient about new prescription for lisinopril to be sent to the pharmacy. Patient is aware to take lisinopril 10 mg once daily and continue recording blood pressures daily at home.  Plan to follow up in 2-3 weeks to see how she is tolerating and for further BP readings.  Patient was also instructed to decrease aspirin 325 mg to 81 mg daily to reduce risk for bleeding and kidney damage.  Patient verbalized her understanding and is aware to call the office back with any questions or concerns.

## 2021-01-22 DIAGNOSIS — E039 Hypothyroidism, unspecified: Secondary | ICD-10-CM | POA: Diagnosis not present

## 2021-01-22 DIAGNOSIS — Z008 Encounter for other general examination: Secondary | ICD-10-CM | POA: Diagnosis not present

## 2021-01-22 DIAGNOSIS — Z7982 Long term (current) use of aspirin: Secondary | ICD-10-CM | POA: Diagnosis not present

## 2021-01-22 DIAGNOSIS — I10 Essential (primary) hypertension: Secondary | ICD-10-CM | POA: Diagnosis not present

## 2021-01-22 DIAGNOSIS — Z7722 Contact with and (suspected) exposure to environmental tobacco smoke (acute) (chronic): Secondary | ICD-10-CM | POA: Diagnosis not present

## 2021-01-22 DIAGNOSIS — Z8249 Family history of ischemic heart disease and other diseases of the circulatory system: Secondary | ICD-10-CM | POA: Diagnosis not present

## 2021-01-22 DIAGNOSIS — Z87891 Personal history of nicotine dependence: Secondary | ICD-10-CM | POA: Diagnosis not present

## 2021-01-22 DIAGNOSIS — G4700 Insomnia, unspecified: Secondary | ICD-10-CM | POA: Diagnosis not present

## 2021-01-22 DIAGNOSIS — K08109 Complete loss of teeth, unspecified cause, unspecified class: Secondary | ICD-10-CM | POA: Diagnosis not present

## 2021-01-25 ENCOUNTER — Telehealth: Payer: Self-pay | Admitting: Pharmacist

## 2021-01-25 NOTE — Chronic Care Management (AMB) (Signed)
Chronic Care Management Pharmacy Assistant   Name: Linda Lucero  MRN: [id number] DOB: 12/04/1927  Reason for Encounter: Disease State   Conditions to be addressed/monitored: HTN   Recent office visits:  None  Recent consult visits:  None  Hospital visits:  None in previous 6 months  Medications: Outpatient Encounter Medications as of 01/25/2021  Medication Sig  . amLODipine (NORVASC) 10 MG tablet Take 1 tablet (10 mg total) by mouth at bedtime.  Marland Kitchen aspirin EC 325 MG tablet Take 1 tablet (325 mg total) by mouth daily. (Patient taking differently: Take 325 mg by mouth daily as needed.)  . cholecalciferol (VITAMIN D) 1000 units tablet Take 1 tablet (1,000 Units total) by mouth daily.  . cloNIDine (CATAPRES) 0.1 MG tablet Take 1 tablet (0.1 mg total) by mouth 2 (two) times daily.  . diclofenac Sodium (VOLTAREN) 1 % GEL Apply 2 g topically 4 (four) times daily.  . hydrALAZINE (APRESOLINE) 50 MG tablet Take 1 tablet (50 mg total) by mouth 3 (three) times daily. Take with food.  Marland Kitchen levothyroxine (SYNTHROID) 75 MCG tablet TAKE 1 TABLET BY MOUTH EVERY DAY  . lisinopril (ZESTRIL) 10 MG tablet Take 1 tablet (10 mg total) by mouth daily.  . Multiple Vitamins-Minerals (MULTIVITAMIN ADULTS PO) Take 1 tablet by mouth daily.   . traZODone (DESYREL) 50 MG tablet Take 0.5 tablets (25 mg total) by mouth at bedtime.   No facility-administered encounter medications on file as of 01/25/2021.   Reviewed chart prior to disease state call. Spoke with patient regarding BP  Recent Office Vitals: BP Readings from Last 3 Encounters:  06/23/20 (!) 164/90  05/25/20 (!) 190/64  05/14/20 (!) 180/58   Pulse Readings from Last 3 Encounters:  10/27/20 82  08/11/20 82  06/23/20 78    Wt Readings from Last 3 Encounters:  10/27/20 160 lb 1.6 oz (72.6 kg)  08/11/20 161 lb 4.8 oz (73.2 kg)  06/23/20 159 lb 12.8 oz (72.5 kg)     Kidney Function Lab Results  Component Value Date/Time   CREATININE  1.49 (H) 08/20/2020 02:26 PM   CREATININE 1.98 (H) 08/11/2020 03:19 PM   GFR 49.71 (L) 12/19/2019 02:04 PM   GFRNONAA 34 (L) 05/07/2020 07:58 PM   GFRAA 40 (L) 05/07/2020 07:58 PM    BMP Latest Ref Rng & Units 08/20/2020 08/11/2020 05/14/2020  Glucose 65 - 99 mg/dL 361(W) 99 99  BUN 7 - 25 mg/dL 43(X) 54(M) 21  Creatinine 0.60 - 0.88 mg/dL 0.86(P) 6.19(J) 0.93(O)  BUN/Creat Ratio 6 - 22 (calc) 17 14 16   Sodium 135 - 146 mmol/L 140 137 139  Potassium 3.5 - 5.3 mmol/L 4.2 3.9 4.0  Chloride 98 - 110 mmol/L 110 103 107  CO2 20 - 32 mmol/L 22 20 21   Calcium 8.6 - 10.4 mg/dL 9.5 9.5   . Current antihypertensive regimen:  o Lisinopril 10 mg daily o Amlodipine 10 mg daily o Hydralazine 50 mg: one tablet three times a day . How often are you checking your Blood Pressure? daily . Current home BP readings:  o 03.18 162/70 o 03.19 166/76 o 03.20 158/81 o 03.21 165/70 . What recent interventions/DTPs have been made by any provider to improve Blood Pressure control since last CPP Visit: o   Added Lisinopril 10 mg daily o Aspirin decreased 325 mg to 81 mg daily  . Any recent hospitalizations or ED visits since last visit with CPP? No . What diet changes have been made to  improve Blood Pressure Control?  o None . What exercise is being done to improve your Blood Pressure Control?  o None  Adherence Review: Is the patient currently on ACE/ARB medication? Yes Does the patient have >5 day gap between last estimated fill dates? No  I spoke with the patient and discussed medication adherence with the patient. I question the patient on the effects of the changes from her medications from her last appointment. She states that she has been taking her Lisinopril 10 mg daily. She has not been taking the aspirin 81 mg daily. She just stopped taking aspirin altogether. She states that her blood pressure has still been running a little high. She has compared them to the reading that has been taken by  her home health nurse, and they are consistent. There are no other changes to her medications. She denies any side effects from her medications. She denies ED visits since his last CPP follow-up. The patient denies any side effects with his medication. Also, denies any problems with his current pharmacy.  Star Rating Drugs:  Dispensed Quantity Pharmacy  Lisinopril 10 mg [facility]22 [facility]    Amilia Tomma Rakers, [facility] Clinical Pharmacy Assistant [phone]

## 2021-02-15 ENCOUNTER — Other Ambulatory Visit: Payer: Self-pay | Admitting: Family Medicine

## 2021-02-15 ENCOUNTER — Telehealth: Payer: Self-pay | Admitting: Pharmacist

## 2021-02-15 NOTE — Telephone Encounter (Signed)
Called patient to discuss recommendations from PCP after BP assessment call.   Patient can increase to 2 tablets of lisinopril 10 mg daily for additional BP lowering. She was instructed to check BP prior to taking hydralazine to make sure systolic BP does not go below 65. Patient is aware to call the office if she has any symptoms of hypotension or any low BP readings.  Recommended for her to restart aspirin 81 mg daily per recommendation from PCP.  Patient verbalized her understanding and follow up office visit with pharmacist was made for 3 weeks to recheck BP and look at BP log.

## 2021-02-15 NOTE — Telephone Encounter (Signed)
We could bump up lisinopril to 20mg  daily; but she will have to check carefully before taking the hydralazine because I really don't want her diastolic to be below 65. But as long as she is otherwise feeling well I am ok with her trying that to see how she does in next couple of weeks. If diastolic dropping we can go back to the lisinopril 10mg .   Would like a baby aspirin on board :)

## 2021-02-24 ENCOUNTER — Telehealth: Payer: Self-pay | Admitting: Pharmacist

## 2021-02-24 NOTE — Progress Notes (Addendum)
Chronic Care Management Pharmacy Assistant   Name: Linda Lucero  MRN: [id number] DOB: Sep 04, 1928  Reason for Encounter: Disease State For HTN.   Conditions to be addressed/monitored: HTN, CKD, Hypothyroidism.   Recent office visits:  None since 02/15/21  Recent consult visits:  None since 02/15/21  Hospital visits:  None since 02/15/21  Medications: Outpatient Encounter Medications as of 02/24/2021  Medication Sig   amLODipine (NORVASC) 10 MG tablet Take 1 tablet (10 mg total) by mouth at bedtime.   aspirin EC 325 MG tablet Take 1 tablet (325 mg total) by mouth daily. (Patient taking differently: Take 325 mg by mouth daily as needed.)   cholecalciferol (VITAMIN D) 1000 units tablet Take 1 tablet (1,000 Units total) by mouth daily.   cloNIDine (CATAPRES) 0.1 MG tablet Take 1 tablet (0.1 mg total) by mouth 2 (two) times daily.   diclofenac Sodium (VOLTAREN) 1 % GEL Apply 2 g topically 4 (four) times daily.   hydrALAZINE (APRESOLINE) 50 MG tablet Take 1 tablet (50 mg total) by mouth 3 (three) times daily. Take with food.   levothyroxine (SYNTHROID) 75 MCG tablet TAKE 1 TABLET BY MOUTH EVERY DAY   lisinopril (ZESTRIL) 10 MG tablet Take 1 tablet (10 mg total) by mouth daily.   Multiple Vitamins-Minerals (MULTIVITAMIN ADULTS PO) Take 1 tablet by mouth daily.    traZODone (DESYREL) 50 MG tablet Take 0.5 tablets (25 mg total) by mouth at bedtime.   No facility-administered encounter medications on file as of 02/24/2021.   Reviewed chart prior to disease state call. Spoke with patient regarding BP  Recent Office Vitals: BP Readings from Last 3 Encounters:  06/23/20 (!) 164/90  05/25/20 (!) 190/64  05/14/20 (!) 180/58   Pulse Readings from Last 3 Encounters:  10/27/20 82  08/11/20 82  06/23/20 78    Wt Readings from Last 3 Encounters:  10/27/20 160 lb 1.6 oz (72.6 kg)  08/11/20 161 lb 4.8 oz (73.2 kg)  06/23/20 159 lb 12.8 oz (72.5 kg)     Kidney Function Lab Results   Component Value Date/Time   CREATININE 1.49 (H) 08/20/2020 02:26 PM   CREATININE 1.98 (H) 08/11/2020 03:19 PM   GFR 49.71 (L) 12/19/2019 02:04 PM   GFRNONAA 34 (L) 05/07/2020 07:58 PM   GFRAA 40 (L) 05/07/2020 07:58 PM    BMP Latest Ref Rng & Units 08/20/2020 08/11/2020 05/14/2020  Glucose 65 - 99 mg/dL 700(F) 99 99  BUN 7 - 25 mg/dL 74(B) 44(H) 21  Creatinine 0.60 - 0.88 mg/dL 6.75(F) 1.63(W) 4.66(Z)  BUN/Creat Ratio 6 - 22 (calc) 17 14 16   Sodium 135 - 146 mmol/L 140 137 139  Potassium 3.5 - 5.3 mmol/L 4.2 3.9 4.0  Chloride 98 - 110 mmol/L 110 103 107  CO2 20 - 32 mmol/L 22 20 21   Calcium 8.6 - 10.4 mg/dL 9.5 9.5    Current antihypertensive regimen:  Lisinopril 10 mg 1 tablet two times daily  Amlodipine 10 mg 1 tablet at bedtime  Hydralazine 50 mg 1 tablet three times daily  Clonidine 0.1 mg 1 tablet twice daily  How often are you checking your Blood Pressure? 1-2x per week   Current home BP readings:  02/24/21 184/65 P84   02/23/21  202/88 P83  What recent interventions/DTPs have been made by any provider to improve Blood Pressure control since last CPP Visit: INCREASED Lisinopril to 2 tablets daily   Any recent hospitalizations or ED visits since last visit with CPP? Patient  stated no  What diet changes have been made to improve Blood Pressure Control?  Patient stated she has not changed her diet in anyway.   What exercise is being done to improve your Blood Pressure Control?  Patient stated she walks up and down the steps a few times a day. She stated she cooks and cleans for her herself.   Adherence Review: Is the patient currently on ACE/ARB medication? Lisinopril 10 mg  Does the patient have >5 day gap between last estimated fill dates? Per misc rpts, no.   15 min  Star Rating Drugs: Lisinopril 10 mg 90 DS 01/07/21  Follow-Up:Pharmacist Review  Hulen Luster, RMA Clinical Pharmacist Assistant [phone]

## 2021-03-07 ENCOUNTER — Telehealth: Payer: Self-pay | Admitting: Pharmacist

## 2021-03-07 NOTE — Chronic Care Management (AMB) (Addendum)
03/07/2021- Called patient to remind of appointment with Gaylord Shih, CPP on 03/08/2021 at 3pm. Patient aware of appointment date, time and location. Patient will bring medications, supplements and any blood pressure or blood sugar logs with her to office visit with the Pharmacist tomorrow.  Billee Cashing, [facility] Health Concierge [phone]

## 2021-03-08 ENCOUNTER — Ambulatory Visit (INDEPENDENT_AMBULATORY_CARE_PROVIDER_SITE_OTHER): Payer: Medicare HMO | Admitting: Pharmacist

## 2021-03-08 ENCOUNTER — Other Ambulatory Visit: Payer: Self-pay

## 2021-03-08 VITALS — BP 164/84

## 2021-03-08 DIAGNOSIS — N1831 Chronic kidney disease, stage 3a: Secondary | ICD-10-CM | POA: Diagnosis not present

## 2021-03-08 DIAGNOSIS — I10 Essential (primary) hypertension: Secondary | ICD-10-CM | POA: Diagnosis not present

## 2021-03-08 NOTE — Progress Notes (Signed)
Chronic Care Management Pharmacy Note  03/11/2021 Name:  Linda Lucero MRN:  [id number] DOB:  09-15-1928  Subjective: [name] is an 87 y.o. year old female who is a primary patient of Koberlein, Steele Berg, MD.  The CCM team was consulted for assistance with disease management and care coordination needs.    Engaged with patient face to face for follow up visit in response to provider referral for pharmacy case management and/or care coordination services.   Consent to Services:  The patient was given information about Chronic Care Management services, agreed to services, and gave verbal consent prior to initiation of services.  Please see initial visit note for detailed documentation.   Patient Care Team: Caren Macadam, MD as PCP - General (Family Medicine) Viona Gilmore, [facility] as Pharmacist (Pharmacist)  Recent office visits: 01/25/21 Telephone encounter (CCM): Lisinopril increased to 20 mg. Patient   12/30/20 Jeni Salles, [facility]: Patient presented for CCM visit. Lisinopril started at 10 mg daily and aspirin decreased to 81 mg daily.  10/27/20 Micheline Rough, MD: Patient presented for chronic conditions follow up. Recommended Zinc 50 mg/day as zinc level was low. Prescribed diclofenac gel for arthritis.  08/11/20 Micheline Rough, MD: Patient presented for HTN follow up. Stopped HCTZ and started clonidine 1/2 tablet BID and trial of trazodone 1/2 tablet for sleep.  06/23/20: Patient presented to Dr. Ethlyn Gallery for follow-up. BP in clinic 164/90. Home BP 140-150. Patient was taking amlodipine 10 mg twice daily, decreased to 10 mg daily.   Recent consult visits: 05/07/20: Patient presented to ED for hypertensive urgency. Patient only taking hydralazine twice daily instead of TID.   Hospital visits: None in previous 6 months  Objective:  Lab Results  Component Value Date   CREATININE 1.49 (H) 08/20/2020   BUN 26 (H) 08/20/2020   GFR 49.71 (L) 12/19/2019   GFRNONAA  34 (L) 05/07/2020   GFRAA 40 (L) 05/07/2020   NA 140 08/20/2020   K 4.2 08/20/2020   CALCIUM 9.5 08/20/2020   CO2 22 08/20/2020    Lab Results  Component Value Date/Time   GFR 49.71 (L) 12/19/2019 02:04 PM   GFR 53.24 (L) 07/08/2019 02:48 PM    Last diabetic Eye exam: No results found for: HMDIABEYEEXA  Last diabetic Foot exam: No results found for: HMDIABFOOTEX   Lab Results  Component Value Date   CHOL 222 (H) 05/14/2020   HDL 93 05/14/2020   LDLCALC 113 (H) 05/14/2020   TRIG 75 05/14/2020   CHOLHDL 2.4 05/14/2020    Hepatic Function Latest Ref Rng & Units 05/14/2020 11/14/2019 12/25/2018  Total Protein 6.1 - 8.1 g/dL 7.1 7.6 7.7  Albumin 3.5 - 5.2 g/dL - - 4.4  AST 10 - 35 U/L _0 ALT 6 - 29 U/L _1 Alk Phosphatase 39 - 117 U/L - - 86  Total Bilirubin 0.2 - 1.2 mg/dL 0.4 0.3 0.4    Lab Results  Component Value Date/Time   TSH 4.35 10/27/2020 04:13 PM   TSH 4.98 (H) 08/11/2020 03:18 PM   FREET4 1.2 10/27/2020 04:13 PM   FREET4 0.71 12/25/2018 03:16 PM    CBC Latest Ref Rng & Units 08/11/2020 05/07/2020 11/14/2019  WBC 3.8 - 10.8 Thousand/uL 8.2 7.8 8.1  Hemoglobin 11.7 - 15.5 g/dL 11.8 11.5(L) 10.5(L)  Hematocrit 35.0 - 45.0 % 38.7 35.1(L) 34.2(L)  Platelets 140 - 400 Thousand/uL 333 267 313    No results found for: VD25OH  Clinical  ASCVD: No  The ASCVD Risk score Mikey Bussing DC Jr., et al., 2013) failed to calculate for the following reasons:   The 2013 ASCVD risk score is only valid for ages 30 to 10    Depression screen PHQ 2/9 05/25/2020 05/14/2020 12/25/2018  Decreased Interest 0 1 1  Down, Depressed, Hopeless 0 3 1  PHQ - 2 Score 0 4 2  Altered sleeping - - 1  Tired, decreased energy - - 1  Change in appetite - - 1  Feeling bad or failure about yourself  - - 1  Trouble concentrating - - 0  Moving slowly or fidgety/restless - - 0  Suicidal thoughts - - 0  PHQ-9 Score - - 6      Social History   Tobacco Use  Smoking Status Former Smoker  . Types:  Cigarettes  Smokeless Tobacco Never Used   BP Readings from Last 3 Encounters:  03/08/21 (!) 164/84  06/23/20 (!) 164/90  05/25/20 (!) 190/64   Pulse Readings from Last 3 Encounters:  10/27/20 82  08/11/20 82  06/23/20 78   Wt Readings from Last 3 Encounters:  10/27/20 160 lb 1.6 oz (72.6 kg)  08/11/20 161 lb 4.8 oz (73.2 kg)  06/23/20 159 lb 12.8 oz (72.5 kg)    Assessment/Interventions: Review of patient past medical history, allergies, medications, health status, including review of consultants reports, laboratory and other test data, was performed as part of comprehensive evaluation and provision of chronic care management services.   SDOH:  (Social Determinants of Health) assessments and interventions performed: No   Patient reports brown urine and believes it is from her hydralazine. Recommended getting lab work done to check urine and kidneys and follow up with Dr. Maudie Mercury. Patient refused as she did not wish to see anyone except Dr. Ethlyn Gallery.   Patient also reports that her BP cuff has stopped working and is giving her an error code. This happened within the past week.   Patient has been drinking more water and averages about 5 mugs per day, which was a lot more than before. She also reports she feels somewhat wobbly which has been going on since starting the lisinopril. She did not notice that this changed when increasing her dose either.   Patient reports her BP is usually lower in the morning but gets higher throughout the day. Discussed proper BP monitoring technique. She checked her green tea package and did not see any caffeine.    BP Readings from Last 3 Encounters:  03/08/21 (!) 164/84  06/23/20 (!) 164/90  05/25/20 (!) 190/64    CCM Care Plan  Allergies  Allergen Reactions  . Diovan [Valsartan]   . Doxazosin     Medications Reviewed Today    Reviewed by Viona Gilmore, [facility] (Pharmacist) on 03/11/21 at 1010  Med List Status: <None>  Medication Order  Taking? Sig Documenting Provider Last Dose Status Informant  amLODipine (NORVASC) 10 MG tablet [id number]  Take 1 tablet (10 mg total) by mouth at bedtime. Caren Macadam, MD  Active   aspirin EC 81 MG tablet [id number] Yes Take 81 mg by mouth daily. Swallow whole. [provider] Taking Active   cholecalciferol (VITAMIN D) 1000 units tablet [id number]  Take 1 tablet (1,000 Units total) by mouth daily. Caren Macadam, MD  Active Self  cloNIDine (CATAPRES) 0.1 MG tablet [id number]  Take 1 tablet (0.1 mg total) by mouth 2 (two) times daily. Caren Macadam, MD  Active   diclofenac  Sodium (VOLTAREN) 1 % GEL [id number]  Apply 2 g topically 4 (four) times daily. Caren Macadam, MD  Active   hydrALAZINE (APRESOLINE) 50 MG tablet [id number]  Take 1 tablet (50 mg total) by mouth 3 (three) times daily. Take with food. Caren Macadam, MD  Active   levothyroxine (SYNTHROID) 75 MCG tablet [id number]  TAKE 1 TABLET BY MOUTH EVERY DAY Koberlein, Junell C, MD  Active   lisinopril (ZESTRIL) 10 MG tablet [id number] Yes Take 1 tablet (10 mg total) by mouth daily.  Patient taking differently: Take 20 mg by mouth daily.   Caren Macadam, MD Taking Active   Multiple Vitamins-Minerals (MULTIVITAMIN ADULTS PO) [id number]  Take 1 tablet by mouth daily.  [provider]  Active Self  traZODone (DESYREL) 50 MG tablet [id number]  Take 0.5 tablets (25 mg total) by mouth at bedtime. Caren Macadam, MD  Active           Patient Active Problem List   Diagnosis Date Noted  . Low back pain 09/06/2018  . Hypertension 09/06/2018  . Chronic kidney disease (CKD), stage III (moderate) ([place]) 09/06/2018  . Hypothyroidism 09/06/2018    Immunization History  Administered Date(s) Administered  . Fluad Quad(high Dose 65+) 10/27/2020  . Influenza, High Dose Seasonal PF 09/04/2018, 08/01/2019  . Influenza-Unspecified 08/01/2019  . PFIZER(Purple Top)SARS-COV-2 Vaccination 01/11/2020,  02/10/2020  . Pneumococcal Conjugate-13 10/02/2016  . Zoster 10/02/2016    Conditions to be addressed/monitored:  Hypertension, Chronic Kidney Disease, Hypothyroidism, Osteoarthritis and Insomnia  Care Plan : [place]  Updates made by Viona Gilmore, [place] since 03/11/2021 12:00 AM    Problem: Problem: Hypertension, Chronic Kidney Disease, Hypothyroidism, Osteoarthritis and Insomnia     Long-Range Goal: Patient-Specific Goal   Start Date: 12/30/2020  Expected End Date: 12/30/2021  Recent Progress: On track  Priority: High  Note:   Current Barriers:  . Unable to independently monitor therapeutic efficacy . Unable to achieve control of blood pressure   Pharmacist Clinical Goal(s):  Marland Kitchen Over the next 90 days, patient will achieve control of hypertension as evidenced by blood pressure readings  through collaboration with PharmD and provider.   Interventions: . 1:1 collaboration with Caren Macadam, MD regarding development and update of comprehensive plan of care as evidenced by provider attestation and co-signature . Inter-disciplinary care team collaboration (see longitudinal plan of care) . Comprehensive medication review performed; medication list updated in electronic medical record  Hypertension (BP goal <140/90) -Uncontrolled -Current treatment:  Amlodipine 10 mg daily   Hydralazine 50 mg three times daily  Clonidine 0.1 mg one tablet twice daily  Lisinopril 10 mg 2 tablets once daily -Medications previously tried: HCTZ (side effects), losartan (unknown), valsartan -Current home readings:  160/68, 155/74, 170s/80s  -Current dietary habits: doesn't eat a lot of salt when cooking and does not eat out; doesn't eat processed foods and drinks 5 glasses of water per day -Current exercise habits: does not sit down all day but no structured exercise -Denies hypotensive/hypertensive symptoms -Educated on Importance of home blood pressure monitoring; Proper BP  monitoring technique; Symptoms of hypotension and importance of maintaining adequate hydration; -Counseled to monitor BP at home twice weekly, document, and provide log at future appointments -Recommended to continue current medication Collaborated with PCP to adjust blood pressure medications  Insomnia (Goal: improve quality and quantity of sleep) -Controlled -Current treatment  . Trazodone 50 mg 1/2 tablet daily as needed -Medications previously tried: none  -Recommended to continue  current medication Counseled on practicing good sleep hygiene by setting a sleep schedule and maintaining it, avoid excessive napping, following a nightly routine, avoiding screen time for 30-60 minutes before going to bed, and making the bedroom a cool, quiet and dark space  Hypothyroidism (Goal: TSH 0.35-4.5) -Controlled -Current treatment  . Levothyroxine 75 mcg 1 tablet daily -Medications previously tried: none  -Recommended to continue current medication   Osteoarthritis (Goal: minimize pain associated with arthritis) -Controlled -Current treatment  . Diclofenac 1% gel apply as needed . Aspirin 81 mg daily   . Tylenol as needed -Medications previously tried: none  -Counseled on risk of bleeding with aspirin and kidney damage Recommended use of Tylenol more often than aspirin   CKD (Goal: prevent further worsening of kidneys) -Controlled -Current treatment  . No medications -Medications previously tried: losartan, valsartan  -Counseled on avoidance of NSAIDs for worsening kidney function   Health Maintenance -Vaccine gaps: tetanus -Current therapy:  . Zinc 50 mg daily   Vitamin D 1000 units daily   Multivitamin daily  -Educated on Cost vs benefit of each product must be carefully weighed by individual consumer -Patient is satisfied with current therapy and denies issues -Recommended to continue current medication  Patient Goals/Self-Care Activities . Over the next 90 days, patient  will:  - take medications as prescribed check blood pressure twice weekly, document, and provide at future appointments  Follow Up Plan: Face to Face appointment with care management team member scheduled for:  The care management team will reach out to the patient again over the next 21 days.       Patient Care Plan: CCM Pharmacy Care Plan    Problem Identified: Problem: Hypertension, Chronic Kidney Disease, Hypothyroidism, Osteoarthritis and Insomnia     Long-Range Goal: Patient-Specific Goal   Start Date: 12/30/2020  Expected End Date: 12/30/2021  Recent Progress: On track  Priority: High  Note:   Current Barriers:  . Unable to independently monitor therapeutic efficacy . Unable to achieve control of blood pressure   Pharmacist Clinical Goal(s):  Marland Kitchen Over the next 90 days, patient will achieve control of hypertension as evidenced by blood pressure readings  through collaboration with PharmD and provider.   Interventions: . 1:1 collaboration with Caren Macadam, MD regarding development and update of comprehensive plan of care as evidenced by provider attestation and co-signature . Inter-disciplinary care team collaboration (see longitudinal plan of care) . Comprehensive medication review performed; medication list updated in electronic medical record  Hypertension (BP goal <140/90) -Uncontrolled -Current treatment:  Amlodipine 10 mg daily   Hydralazine 50 mg three times daily  Clonidine 0.1 mg one tablet twice daily  Lisinopril 10 mg 2 tablets once daily -Medications previously tried: HCTZ (side effects), losartan (unknown), valsartan -Current home readings:  160/68, 155/74, 170s/80s  -Current dietary habits: doesn't eat a lot of salt when cooking and does not eat out; doesn't eat processed foods and drinks 5 glasses of water per day -Current exercise habits: does not sit down all day but no structured exercise -Denies hypotensive/hypertensive symptoms -Educated on  Importance of home blood pressure monitoring; Proper BP monitoring technique; Symptoms of hypotension and importance of maintaining adequate hydration; -Counseled to monitor BP at home twice weekly, document, and provide log at future appointments -Recommended to continue current medication Collaborated with PCP to adjust blood pressure medications  Insomnia (Goal: improve quality and quantity of sleep) -Controlled -Current treatment  . Trazodone 50 mg 1/2 tablet daily as needed -Medications previously tried:  none  -Recommended to continue current medication Counseled on practicing good sleep hygiene by setting a sleep schedule and maintaining it, avoid excessive napping, following a nightly routine, avoiding screen time for 30-60 minutes before going to bed, and making the bedroom a cool, quiet and dark space  Hypothyroidism (Goal: TSH 0.35-4.5) -Controlled -Current treatment  . Levothyroxine 75 mcg 1 tablet daily -Medications previously tried: none  -Recommended to continue current medication   Osteoarthritis (Goal: minimize pain associated with arthritis) -Controlled -Current treatment  . Diclofenac 1% gel apply as needed . Aspirin 81 mg daily   . Tylenol as needed -Medications previously tried: none  -Counseled on risk of bleeding with aspirin and kidney damage Recommended use of Tylenol more often than aspirin   CKD (Goal: prevent further worsening of kidneys) -Controlled -Current treatment  . No medications -Medications previously tried: losartan, valsartan  -Counseled on avoidance of NSAIDs for worsening kidney function   Health Maintenance -Vaccine gaps: tetanus -Current therapy:  . Zinc 50 mg daily   Vitamin D 1000 units daily   Multivitamin daily  -Educated on Cost vs benefit of each product must be carefully weighed by individual consumer -Patient is satisfied with current therapy and denies issues -Recommended to continue current medication  Patient  Goals/Self-Care Activities . Over the next 90 days, patient will:  - take medications as prescribed check blood pressure twice weekly, document, and provide at future appointments  Follow Up Plan: Face to Face appointment with care management team member scheduled for:  The care management team will reach out to the patient again over the next 21 days.        Medication Assistance: None required.  Patient affirms current coverage meets needs.  Patient's preferred pharmacy is:  [id number] [place], [place] [place]Phone: [phone]: [phone] Uses pill box? No - different timing of medications Pt endorses 99% compliance  We discussed: Benefits of medication synchronization, packaging and delivery as well as enhanced pharmacist oversight with [employer]. Patient decided to: Continue current medication management strategy  Care Plan and Follow Up Patient Decision:  Patient agrees to Care Plan and Follow-up.  Plan: Face to Face appointment with care management team member scheduled for: 958days   MJeni Salles PharmD BWillshirePharmacist LArchboldat [phone]

## 2021-03-11 NOTE — Patient Instructions (Signed)
Visit Information  Goals Addressed   None    Patient Care Plan: CCM Pharmacy Care Plan    Problem Identified: Problem: Hypertension, Chronic Kidney Disease, Hypothyroidism, Osteoarthritis and Insomnia     Long-Range Goal: Patient-Specific Goal   Start Date: 12/30/2020  Expected End Date: 12/30/2021  Recent Progress: On track  Priority: High  Note:   Current Barriers:  . Unable to independently monitor therapeutic efficacy . Unable to achieve control of blood pressure   Pharmacist Clinical Goal(s):  Marland Kitchen Over the next 90 days, patient will achieve control of hypertension as evidenced by blood pressure readings  through collaboration with PharmD and provider.   Interventions: . 1:1 collaboration with Wynn Banker, MD regarding development and update of comprehensive plan of care as evidenced by provider attestation and co-signature . Inter-disciplinary care team collaboration (see longitudinal plan of care) . Comprehensive medication review performed; medication list updated in electronic medical record  Hypertension (BP goal <140/90) -Uncontrolled -Current treatment:  Amlodipine 10 mg daily   Hydralazine 50 mg three times daily  Clonidine 0.1 mg one tablet twice daily  Lisinopril 10 mg 2 tablets once daily -Medications previously tried: HCTZ (side effects), losartan (unknown), valsartan -Current home readings:  160/68, 155/74, 170s/80s  -Current dietary habits: doesn't eat a lot of salt when cooking and does not eat out; doesn't eat processed foods and drinks 5 glasses of water per day -Current exercise habits: does not sit down all day but no structured exercise -Denies hypotensive/hypertensive symptoms -Educated on Importance of home blood pressure monitoring; Proper BP monitoring technique; Symptoms of hypotension and importance of maintaining adequate hydration; -Counseled to monitor BP at home twice weekly, document, and provide log at future  appointments -Recommended to continue current medication Collaborated with PCP to adjust blood pressure medications  Insomnia (Goal: improve quality and quantity of sleep) -Controlled -Current treatment  . Trazodone 50 mg 1/2 tablet daily as needed -Medications previously tried: none  -Recommended to continue current medication Counseled on practicing good sleep hygiene by setting a sleep schedule and maintaining it, avoid excessive napping, following a nightly routine, avoiding screen time for 30-60 minutes before going to bed, and making the bedroom a cool, quiet and dark space  Hypothyroidism (Goal: TSH 0.35-4.5) -Controlled -Current treatment  . Levothyroxine 75 mcg 1 tablet daily -Medications previously tried: none  -Recommended to continue current medication   Osteoarthritis (Goal: minimize pain associated with arthritis) -Controlled -Current treatment  . Diclofenac 1% gel apply as needed . Aspirin 81 mg daily   . Tylenol as needed -Medications previously tried: none  -Counseled on risk of bleeding with aspirin and kidney damage Recommended use of Tylenol more often than aspirin   CKD (Goal: prevent further worsening of kidneys) -Controlled -Current treatment  . No medications -Medications previously tried: losartan, valsartan  -Counseled on avoidance of NSAIDs for worsening kidney function   Health Maintenance -Vaccine gaps: tetanus -Current therapy:  . Zinc 50 mg daily   Vitamin D 1000 units daily   Multivitamin daily  -Educated on Cost vs benefit of each product must be carefully weighed by individual consumer -Patient is satisfied with current therapy and denies issues -Recommended to continue current medication  Patient Goals/Self-Care Activities . Over the next 90 days, patient will:  - take medications as prescribed check blood pressure twice weekly, document, and provide at future appointments  Follow Up Plan: Face to Face appointment with care  management team member scheduled for:  The care management team will reach  out to the patient again over the next 21 days.        The patient verbalized understanding of instructions, educational materials, and care plan provided today and declined offer to receive copy of patient instructions, educational materials, and care plan.  The pharmacy team will reach out to the patient again over the next 30 days.   Verner Chol, [facility]

## 2021-03-17 ENCOUNTER — Telehealth: Payer: Self-pay | Admitting: Pharmacist

## 2021-03-17 NOTE — Chronic Care Management (AMB) (Signed)
Chronic Care Management Pharmacy Assistant   Name: Linda Lucero  MRN: [id number] DOB: 06/25/1928  Reason for Encounter: Disease State   Conditions to be addressed/monitored: HTN  Recent office visits:  None  Recent consult visits:  None  Hospital visits:  None in previous 6 months  Medications: Outpatient Encounter Medications as of 03/17/2021  Medication Sig  . amLODipine (NORVASC) 10 MG tablet Take 1 tablet (10 mg total) by mouth at bedtime.  Marland Kitchen aspirin EC 81 MG tablet Take 81 mg by mouth daily. Swallow whole.  . cholecalciferol (VITAMIN D) 1000 units tablet Take 1 tablet (1,000 Units total) by mouth daily.  . cloNIDine (CATAPRES) 0.1 MG tablet Take 1 tablet (0.1 mg total) by mouth 2 (two) times daily.  . diclofenac Sodium (VOLTAREN) 1 % GEL Apply 2 g topically 4 (four) times daily.  . hydrALAZINE (APRESOLINE) 50 MG tablet Take 1 tablet (50 mg total) by mouth 3 (three) times daily. Take with food.  Marland Kitchen levothyroxine (SYNTHROID) 75 MCG tablet TAKE 1 TABLET BY MOUTH EVERY DAY  . lisinopril (ZESTRIL) 10 MG tablet Take 1 tablet (10 mg total) by mouth daily. (Patient taking differently: Take 20 mg by mouth daily.)  . Multiple Vitamins-Minerals (MULTIVITAMIN ADULTS PO) Take 1 tablet by mouth daily.   . traZODone (DESYREL) 50 MG tablet Take 0.5 tablets (25 mg total) by mouth at bedtime.   No facility-administered encounter medications on file as of 03/17/2021.   Reviewed chart prior to disease state call. Spoke with patient regarding BP  Recent Office Vitals: BP Readings from Last 3 Encounters:  03/28/21 (!) 145/53  03/08/21 (!) 164/84  06/23/20 (!) 164/90   Pulse Readings from Last 3 Encounters:  03/28/21 (!) 43  10/27/20 82  08/11/20 82    Wt Readings from Last 3 Encounters:  03/28/21 145 lb 8.1 oz (66 kg)  10/27/20 160 lb 1.6 oz (72.6 kg)  08/11/20 161 lb 4.8 oz (73.2 kg)     Kidney Function Lab Results  Component Value Date/Time   CREATININE 1.88 (H)  03/28/2021 12:39 AM   CREATININE 1.55 (H) 03/27/2021 12:24 AM   CREATININE 1.49 (H) 08/20/2020 02:26 PM   CREATININE 1.98 (H) 08/11/2020 03:19 PM   GFR 49.71 (L) 12/19/2019 02:04 PM   GFRNONAA 25 (L) 03/28/2021 12:39 AM   GFRAA 40 (L) 05/07/2020 07:58 PM    BMP Latest Ref Rng & Units 03/28/2021 03/27/2021 03/26/2021  Glucose 70 - 99 mg/dL 92 94 97  BUN 8 - 23 mg/dL 41(D) 40(C) 23  Creatinine 0.44 - 1.00 mg/dL 1.44(Y) 1.85(U) 3.14(H)  BUN/Creat Ratio 6 - 22 (calc) - - -  Sodium 135 - 145 mmol/L 134(L) 133(L) 138  Potassium 3.5 - 5.1 mmol/L 4.2 3.6 3.6  Chloride 98 - 111 mmol/L 102 100 103  CO2 22 - 32 mmol/L 23 27 27   Calcium 8.9 - 10.3 mg/dL ) 7.0(Y) 9.2    . Current antihypertensive regimen:   Amlodipine 10 mg daily   Hydralazine 50 mg three times daily  Clonidine 0.1 mg one tablet twice daily  Lisinopril 10 mg 2 tablets once daily . How often are you checking your Blood Pressure? infrequently . Current home BP readings:  o 05.12 167/82 . What recent interventions/DTPs have been made by any provider to improve Blood Pressure control since last CPP Visit: Increased Lisinopril to 20 mg . Any recent hospitalizations or ED visits since last visit with CPP? No . What diet changes have been made  to improve Blood Pressure Control?  o No changes . What exercise is being done to improve your Blood Pressure Control?  o No changes  Adherence Review: Is the patient currently on ACE/ARB medication? Yes Does the patient have >5 day gap between last estimated fill dates? No  I spoke with the patient about medication adherence. She states that she was not having any issues with her medications. Unfortunately, she has not been able to take her blood pressure at home. She has been having some issues with her cuff and she needs a new cuff. She did state that she was going to call her primary doctor today about some urinary issues. She states that she has not had any headaches or  dizziness. She informed me that she has been sleeping better since her last visit about five to six hours. The patient has not had any falls or injuries since the last CPP or PCP visit. No emergency room or urgent care visits. She denies any issues with her current pharmacy.  Star Rating Drugs:  Dispensed Quantity Pharmacy  Lisinopril 10 mg 03.04.2022 [facility]     Linda Lucero, [facility] Clinical Pharmacist Assistant [phone]

## 2021-03-18 ENCOUNTER — Other Ambulatory Visit: Payer: Self-pay | Admitting: Family Medicine

## 2021-03-18 DIAGNOSIS — N1831 Chronic kidney disease, stage 3a: Secondary | ICD-10-CM

## 2021-03-18 DIAGNOSIS — I10 Essential (primary) hypertension: Secondary | ICD-10-CM

## 2021-03-18 DIAGNOSIS — E785 Hyperlipidemia, unspecified: Secondary | ICD-10-CM

## 2021-03-18 DIAGNOSIS — E890 Postprocedural hypothyroidism: Secondary | ICD-10-CM

## 2021-03-18 DIAGNOSIS — R82998 Other abnormal findings in urine: Secondary | ICD-10-CM

## 2021-03-18 NOTE — Progress Notes (Signed)
Can we try clonidine patch? 0.1mg ? Just wondering if she is more consistent with pressure if that would help. I would be ok with her going back down to 1 tab of lisinopril during that transition as well and monitoring to see how she tolerates.   I will order some bloodwork/urine for evaluation of the darker urine. I agree not normal. If you talk with her and there are any new symptoms, let me know.   Korryn Pancoast

## 2021-03-24 ENCOUNTER — Other Ambulatory Visit: Payer: Self-pay

## 2021-03-24 ENCOUNTER — Inpatient Hospital Stay (HOSPITAL_COMMUNITY)
Admission: EM | Admit: 2021-03-24 | Discharge: 2021-03-29 | DRG: 291 | Disposition: A | Discharge status: Home Health | Payer: Medicare HMO | Attending: Internal Medicine | Admitting: Internal Medicine

## 2021-03-24 ENCOUNTER — Emergency Department (HOSPITAL_COMMUNITY): Payer: Medicare HMO

## 2021-03-24 ENCOUNTER — Encounter (HOSPITAL_COMMUNITY): Payer: Self-pay | Admitting: Pharmacy Technician

## 2021-03-24 DIAGNOSIS — Z6823 Body mass index (BMI) 23.0-23.9, adult: Secondary | ICD-10-CM

## 2021-03-24 DIAGNOSIS — D631 Anemia in chronic kidney disease: Secondary | ICD-10-CM | POA: Diagnosis present

## 2021-03-24 DIAGNOSIS — E785 Hyperlipidemia, unspecified: Secondary | ICD-10-CM | POA: Diagnosis present

## 2021-03-24 DIAGNOSIS — Z66 Do not resuscitate: Secondary | ICD-10-CM | POA: Diagnosis present

## 2021-03-24 DIAGNOSIS — Z888 Allergy status to other drugs, medicaments and biological substances status: Secondary | ICD-10-CM

## 2021-03-24 DIAGNOSIS — I160 Hypertensive urgency: Secondary | ICD-10-CM | POA: Diagnosis not present

## 2021-03-24 DIAGNOSIS — I110 Hypertensive heart disease with heart failure: Secondary | ICD-10-CM | POA: Diagnosis not present

## 2021-03-24 DIAGNOSIS — Z8249 Family history of ischemic heart disease and other diseases of the circulatory system: Secondary | ICD-10-CM

## 2021-03-24 DIAGNOSIS — I509 Heart failure, unspecified: Secondary | ICD-10-CM

## 2021-03-24 DIAGNOSIS — R0602 Shortness of breath: Secondary | ICD-10-CM

## 2021-03-24 DIAGNOSIS — I5033 Acute on chronic diastolic (congestive) heart failure: Secondary | ICD-10-CM

## 2021-03-24 DIAGNOSIS — E440 Moderate protein-calorie malnutrition: Secondary | ICD-10-CM | POA: Diagnosis present

## 2021-03-24 DIAGNOSIS — R778 Other specified abnormalities of plasma proteins: Secondary | ICD-10-CM | POA: Diagnosis not present

## 2021-03-24 DIAGNOSIS — I130 Hypertensive heart and chronic kidney disease with heart failure and stage 1 through stage 4 chronic kidney disease, or unspecified chronic kidney disease: Principal | ICD-10-CM | POA: Diagnosis present

## 2021-03-24 DIAGNOSIS — E876 Hypokalemia: Secondary | ICD-10-CM | POA: Diagnosis present

## 2021-03-24 DIAGNOSIS — I517 Cardiomegaly: Secondary | ICD-10-CM | POA: Diagnosis not present

## 2021-03-24 DIAGNOSIS — Z7982 Long term (current) use of aspirin: Secondary | ICD-10-CM

## 2021-03-24 DIAGNOSIS — Z79890 Hormone replacement therapy (postmenopausal): Secondary | ICD-10-CM

## 2021-03-24 DIAGNOSIS — I2510 Atherosclerotic heart disease of native coronary artery without angina pectoris: Secondary | ICD-10-CM | POA: Diagnosis present

## 2021-03-24 DIAGNOSIS — N1831 Chronic kidney disease, stage 3a: Secondary | ICD-10-CM | POA: Diagnosis not present

## 2021-03-24 DIAGNOSIS — Z20822 Contact with and (suspected) exposure to covid-19: Secondary | ICD-10-CM | POA: Diagnosis present

## 2021-03-24 DIAGNOSIS — Z79899 Other long term (current) drug therapy: Secondary | ICD-10-CM

## 2021-03-24 DIAGNOSIS — G4730 Sleep apnea, unspecified: Secondary | ICD-10-CM | POA: Diagnosis present

## 2021-03-24 DIAGNOSIS — I493 Ventricular premature depolarization: Secondary | ICD-10-CM | POA: Diagnosis present

## 2021-03-24 DIAGNOSIS — Z87891 Personal history of nicotine dependence: Secondary | ICD-10-CM

## 2021-03-24 DIAGNOSIS — E039 Hypothyroidism, unspecified: Secondary | ICD-10-CM | POA: Diagnosis present

## 2021-03-24 DIAGNOSIS — I10 Essential (primary) hypertension: Secondary | ICD-10-CM | POA: Diagnosis present

## 2021-03-24 LAB — CBC WITH DIFFERENTIAL/PLATELET
Abs Immature Granulocytes: 0 10*3/uL (ref 0.00–0.07)
Basophils Absolute: 0.1 10*3/uL (ref 0.0–0.1)
Basophils Relative: 2 %
Eosinophils Absolute: 0.3 10*3/uL (ref 0.0–0.5)
Eosinophils Relative: 4 %
HCT: 31.7 % — ABNORMAL LOW (ref 36.0–46.0)
Hemoglobin: 10.2 g/dL — ABNORMAL LOW (ref 12.0–15.0)
Lymphocytes Relative: 37 %
Lymphs Abs: 2.5 10*3/uL (ref 0.7–4.0)
MCH: 20.5 pg — ABNORMAL LOW (ref 26.0–34.0)
MCHC: 32.2 g/dL (ref 30.0–36.0)
MCV: 63.8 fL — ABNORMAL LOW (ref 80.0–100.0)
Monocytes Absolute: 0.3 10*3/uL (ref 0.1–1.0)
Monocytes Relative: 4 %
Neutro Abs: 3.6 10*3/uL (ref 1.7–7.7)
Neutrophils Relative %: 53 %
Platelets: 260 10*3/uL (ref 150–400)
RBC: 4.97 MIL/uL (ref 3.87–5.11)
RDW: 19.5 % — ABNORMAL HIGH (ref 11.5–15.5)
WBC: 6.8 10*3/uL (ref 4.0–10.5)
nRBC: 0 % (ref 0.0–0.2)
nRBC: 0 /100 WBC

## 2021-03-24 LAB — COMPREHENSIVE METABOLIC PANEL
ALT: 11 U/L (ref 0–44)
AST: 20 U/L (ref 15–41)
Albumin: 3.6 g/dL (ref 3.5–5.0)
Alkaline Phosphatase: 61 U/L (ref 38–126)
Anion gap: 7 (ref 5–15)
BUN: 14 mg/dL (ref 8–23)
CO2: 24 mmol/L (ref 22–32)
Calcium: 9.5 mg/dL (ref 8.9–10.3)
Chloride: 109 mmol/L (ref 98–111)
Creatinine, Ser: 1.1 mg/dL — ABNORMAL HIGH (ref 0.44–1.00)
GFR, Estimated: 48 mL/min — ABNORMAL LOW (ref >60)
Glucose, Bld: 97 mg/dL (ref 70–99)
Potassium: 3.9 mmol/L (ref 3.5–5.1)
Sodium: 140 mmol/L (ref 135–145)
Total Bilirubin: 0.7 mg/dL (ref 0.3–1.2)
Total Protein: 7.5 g/dL (ref 6.5–8.1)

## 2021-03-24 LAB — BRAIN NATRIURETIC PEPTIDE: B Natriuretic Peptide: 438.2 pg/mL — ABNORMAL HIGH (ref 0.0–100.0)

## 2021-03-24 LAB — TROPONIN I (HIGH SENSITIVITY)
Troponin I (High Sensitivity): 28 ng/L — ABNORMAL HIGH (ref <18)
Troponin I (High Sensitivity): 30 ng/L — ABNORMAL HIGH (ref <18)

## 2021-03-24 MED ORDER — CLONIDINE HCL 0.1 MG PO TABS
0.1000 mg | ORAL_TABLET | Freq: Two times a day (BID) | ORAL | Status: DC
  Filled 2021-03-24: qty 1

## 2021-03-24 MED ORDER — ONDANSETRON HCL 4 MG/2ML IJ SOLN
4.0000 mg | Freq: Four times a day (QID) | INTRAMUSCULAR | Status: DC | PRN
  Administered 2021-03-27: 4 mg via INTRAVENOUS
  Filled 2021-03-24: qty 2

## 2021-03-24 MED ORDER — FUROSEMIDE 10 MG/ML IJ SOLN
40.0000 mg | Freq: Two times a day (BID) | INTRAMUSCULAR | Status: DC
  Administered 2021-03-24 – 2021-03-27 (×6): 40 mg via INTRAVENOUS
  Filled 2021-03-24 (×6): qty 4

## 2021-03-24 MED ORDER — SODIUM CHLORIDE 0.9 % IV SOLN: 250.0000 mL | INTRAVENOUS | Status: DC | PRN

## 2021-03-24 MED ORDER — SODIUM CHLORIDE 0.9% FLUSH
3.0000 mL | Freq: Two times a day (BID) | INTRAVENOUS | Status: DC
  Administered 2021-03-24 – 2021-03-29 (×10): 3 mL via INTRAVENOUS

## 2021-03-24 MED ORDER — CLONIDINE HCL 0.1 MG PO TABS: 0.1000 mg | ORAL_TABLET | Freq: Once | ORAL | Status: DC

## 2021-03-24 MED ORDER — AMLODIPINE BESYLATE 5 MG PO TABS
10.0000 mg | ORAL_TABLET | Freq: Every day | ORAL | Status: DC
  Filled 2021-03-24: qty 2

## 2021-03-24 MED ORDER — HYDRALAZINE HCL 25 MG PO TABS: 50.0000 mg | ORAL_TABLET | Freq: Once | ORAL | Status: DC

## 2021-03-24 MED ORDER — SODIUM CHLORIDE 0.9% FLUSH
3.0000 mL | INTRAVENOUS | Status: DC | PRN
  Administered 2021-03-24: 3 mL via INTRAVENOUS

## 2021-03-24 MED ORDER — HEPARIN SODIUM (PORCINE) 5000 UNIT/ML IJ SOLN
5000.0000 [IU] | Freq: Three times a day (TID) | INTRAMUSCULAR | Status: DC
  Administered 2021-03-25 – 2021-03-29 (×12): 5000 [IU] via SUBCUTANEOUS
  Filled 2021-03-24 (×13): qty 1

## 2021-03-24 MED ORDER — HYDRALAZINE HCL 25 MG PO TABS
50.0000 mg | ORAL_TABLET | Freq: Three times a day (TID) | ORAL | Status: DC
  Filled 2021-03-24: qty 2

## 2021-03-24 MED ORDER — ACETAMINOPHEN 325 MG PO TABS
650.0000 mg | ORAL_TABLET | ORAL | Status: DC | PRN
  Administered 2021-03-26: 650 mg via ORAL
  Filled 2021-03-24: qty 2

## 2021-03-24 NOTE — ED Provider Notes (Signed)
[facility] EMERGENCY DEPARTMENT Provider Note   CSN: [id number] Arrival date & time: 03/24/21  1434     History No chief complaint on file.   Linda Lucero is a 87 y.o. female.  This morning, Linda Lucero was ambulating when she felt short of breath.  She noticed she had a cough yesterday, and throughout the day she has felt very short of breath, she has been at rest.  She has noticed some mild ankle swelling as well.  She did feel palpitations without chest pain or discomfort.  She has been taking all of her blood pressure medications, and she denies any dietary changes.  She has been under quite a bit of stress due to trying to sell her home.  The history is provided by the patient and a relative (son).  Shortness of Breath Severity:  Moderate Onset quality:  Sudden Timing:  Constant Progression:  Improving Chronicity:  New Context: activity   Relieved by:  Rest Worsened by:  Exertion Ineffective treatments:  Rest Associated symptoms: cough   Associated symptoms: no abdominal pain, no chest pain, no diaphoresis, no ear pain, no fever, no rash, no sore throat and no vomiting        Past Medical History:  Diagnosis Date  . Coronary artery disease   . Hyperlipidemia   . Hypertension   . Renal artery stenosis (HCC)   . Sleep apnea   . Subclavian arterial stenosis (HCC)   . Systolic murmur   . Thyroid disease   . Ventricular hypertrophy     Patient Active Problem List   Diagnosis Date Noted  . Low back pain 09/06/2018  . Hypertension 09/06/2018  . Chronic kidney disease (CKD), stage III (moderate) (HCC) 09/06/2018  . Hypothyroidism 09/06/2018    Past Surgical History:  Procedure Laterality Date  . CHOLECYSTECTOMY       OB History   No obstetric history on file.     Family History  Problem Relation Age of Onset  . Heart disease Mother   . High blood pressure Mother   . Lung cancer Father   . Diabetes Maternal Grandmother   . Heart  disease Maternal Grandmother     Social History   Tobacco Use  . Smoking status: Former Smoker    Types: Cigarettes  . Smokeless tobacco: Never Used    Home Medications Prior to Admission medications   Medication Sig Start Date End Date Taking? Authorizing Provider  amLODipine (NORVASC) 10 MG tablet Take 1 tablet (10 mg total) by mouth at bedtime. 05/19/20   Wynn Banker, MD  aspirin EC 81 MG tablet Take 81 mg by mouth daily. Swallow whole.    [provider]  cholecalciferol (VITAMIN D) 1000 units tablet Take 1 tablet (1,000 Units total) by mouth daily. 09/06/18   Wynn Banker, MD  cloNIDine (CATAPRES) 0.1 MG tablet Take 1 tablet (0.1 mg total) by mouth 2 (two) times daily. 10/27/20   Wynn Banker, MD  diclofenac Sodium (VOLTAREN) 1 % GEL Apply 2 g topically 4 (four) times daily. 10/27/20   Wynn Banker, MD  hydrALAZINE (APRESOLINE) 50 MG tablet Take 1 tablet (50 mg total) by mouth 3 (three) times daily. Take with food. 10/27/20   Wynn Banker, MD  levothyroxine (SYNTHROID) 75 MCG tablet TAKE 1 TABLET BY MOUTH EVERY DAY 10/25/20   Koberlein, Jannette Spanner C, MD  lisinopril (ZESTRIL) 10 MG tablet Take 1 tablet (10 mg total) by mouth daily. Patient  taking differently: Take 20 mg by mouth daily. 01/07/21   Wynn Banker, MD  Multiple Vitamins-Minerals (MULTIVITAMIN ADULTS PO) Take 1 tablet by mouth daily.     [provider]  traZODone (DESYREL) 50 MG tablet Take 0.5 tablets (25 mg total) by mouth at bedtime. 08/11/20   Wynn Banker, MD    Allergies    Diovan [valsartan] and Doxazosin  Review of Systems   Review of Systems  Constitutional: Negative for chills, diaphoresis and fever.  HENT: Negative for ear pain and sore throat.   Eyes: Negative for pain and visual disturbance.  Respiratory: Positive for cough and shortness of breath.   Cardiovascular: Positive for leg swelling. Negative for chest pain and palpitations.   Gastrointestinal: Negative for abdominal pain and vomiting.  Genitourinary: Negative for dysuria and hematuria.  Musculoskeletal: Negative for arthralgias and back pain.  Skin: Negative for color change and rash.  Neurological: Negative for seizures and syncope.  All other systems reviewed and are negative.   Physical Exam Updated Vital Signs BP (!) 180/90   Pulse 90   Temp 99.1 F (37.3 C) (Oral)   Resp (!) 22   LMP  (LMP Unknown)   SpO2 99%   Physical Exam Vitals and nursing note reviewed.  Constitutional:      General: She is not in acute distress.    Appearance: She is well-developed.  HENT:     Head: Normocephalic and atraumatic.  Eyes:     Conjunctiva/sclera: Conjunctivae normal.  Cardiovascular:     Rate and Rhythm: Normal rate and regular rhythm.     Heart sounds: No murmur heard.   Pulmonary:     Effort: Pulmonary effort is normal. No respiratory distress.     Breath sounds: Normal breath sounds.  Abdominal:     Palpations: Abdomen is soft.     Tenderness: There is no abdominal tenderness.  Musculoskeletal:     Cervical back: Neck supple.     Comments: Minimal edema at the ankles  Skin:    General: Skin is warm and dry.  Neurological:     General: No focal deficit present.     Mental Status: She is alert.  Psychiatric:        Mood and Affect: Mood normal.        Thought Content: Thought content normal.     ED Results / Procedures / Treatments   Labs (all labs ordered are listed, but only abnormal results are displayed) Labs Reviewed  CBC WITH DIFFERENTIAL/PLATELET - Abnormal; Notable for the following components:      Result Value   Hemoglobin 10.2 (*)    HCT 31.7 (*)    MCV 63.8 (*)    MCH 20.5 (*)    RDW 19.5 (*)    All other components within normal limits  COMPREHENSIVE METABOLIC PANEL - Abnormal; Notable for the following components:   Creatinine, Ser 1.10 (*)    GFR, Estimated 48 (*)    All other components within normal limits   BRAIN NATRIURETIC PEPTIDE - Abnormal; Notable for the following components:   B Natriuretic Peptide 438.2 (*)    All other components within normal limits  TROPONIN I (HIGH SENSITIVITY) - Abnormal; Notable for the following components:   Troponin I (High Sensitivity) 28 (*)    All other components within normal limits  TROPONIN I (HIGH SENSITIVITY) - Abnormal; Notable for the following components:   Troponin I (High Sensitivity) 30 (*)    All other components  within normal limits  SARS CORONAVIRUS 2 (TAT 6-24 HRS)  BASIC METABOLIC PANEL  CBC    EKG EKG Interpretation  Date/Time:  Thursday Mar 24 2021 15:26:14 EDT Ventricular Rate:  86 PR Interval:  128 QRS Duration: 90 QT Interval:  362 QTC Calculation: 433 R Axis:   12 Text Interpretation: Sinus rhythm with frequent Premature ventricular complexes Moderate voltage criteria for LVH, may be normal variant ( R in aVL , Cornell product ) Septal infarct , age undetermined Abnormal ECG Similar to priorfrom 2021 Confirmed by Pieter Partridge ([id number]) on 03/24/2021 7:46:27 PM   Radiology DG Chest 1 View  Result Date: 03/24/2021 CLINICAL DATA:  Shortness of breath EXAM: CHEST  1 VIEW COMPARISON:  08/22/2017 FINDINGS: Mild cardiomegaly with vascular congestion. Aortic atherosclerosis. No consolidation or significant effusion. No pneumothorax. IMPRESSION: No active disease.  Cardiomegaly with vascular congestion Electronically Signed   By: Jasmine Pang M.D.   On: 03/24/2021 16:27    Procedures Procedures   Medications Ordered in ED Medications  cloNIDine (CATAPRES) tablet 0.1 mg (has no administration in time range)  hydrALAZINE (APRESOLINE) tablet 50 mg (has no administration in time range)    ED Course  I have reviewed the triage vital signs and the nursing notes.  Pertinent labs & imaging results that were available during my care of the patient were reviewed by me and considered in my medical decision making (see chart for  details).  Clinical Course as of 03/24/21 2343  Thu Mar 24, 2021  2343 Dr. Antionette Char will see the patient. [AW]    Clinical Course User Index [AW] Koleen Distance, MD   MDM Rules/Calculators/A&P                          [name] was admitted to the emergency department after dyspnea on exertion which started today.  She had no chest discomfort but did have some palpitations which likely correspond to her noted PVCs on EKG.  She was also quite hypertensive, and I initially considered whether or not she had a hypertensive emergency.  However, ED evaluation points more to congestive heart failure as the etiology of her symptoms.  This is likely related to her longstanding hypertension.  She has slight pulmonary vascular congestion on x-ray, mildly elevated BNP, and slightly elevated troponins.  She will be admitted for further observation. Final Clinical Impression(s) / ED Diagnoses Final diagnoses:  Acute congestive heart failure, unspecified heart failure type [facility])    Rx / DC Orders ED Discharge Orders    None       Koleen Distance, MD 03/24/21 [phone]

## 2021-03-24 NOTE — ED Triage Notes (Signed)
Pt here from home with exertional shob onset today. Pt also states it feels like her heart is pounding. Pt in NAD.

## 2021-03-24 NOTE — ED Provider Notes (Signed)
Emergency Medicine Provider Triage Evaluation Note  Linda Lucero , a 87 y.o. female  was evaluated in triage.  Pt complains of shortness of breath 3 hours ago. Improved once coming to the ER. Currently feeling like her heart is pounding. No history of COPD or asthma. Worse with exertion. No swelling in legs.  No infectious symptoms, no cough.  No prior history of this.  No history of anxiety.  History of hypothyroidism  Review of Systems  Positive: As above Negative: As above Physical Exam  BP (!) 168/91 (BP Location: Right Arm)   Pulse 79   Temp 99.1 F (37.3 C) (Oral)   Resp 17   SpO2 99%  Gen:   Awake, no distress   Resp:  Normal effort  MSK:   Moves extremities without difficulty  Other:  She is well appearing, speaking in full sentences, no audible wheezes lung sounds clear  Medical Decision Making  Medically screening exam initiated at 3:39 PM.  Appropriate orders placed.  [name] was informed that the remainder of the evaluation will be completed by another provider, this initial triage assessment does not replace that evaluation, and the importance of remaining in the ED until their evaluation is complete.     Mare Ferrari, PA-C 03/24/21 1542    Melene Plan, DO 03/24/21 1622

## 2021-03-24 NOTE — ED Notes (Signed)
Patient has complaints of sacral pain. Reports that she has lost a significant amount of weight and now has rubbing on the sacral area. Mepilex placed per patient request.

## 2021-03-24 NOTE — H&P (Signed)
History and Physical    TACORI KVAMME ERX:[id number] DOB: April 22, 1928 DOA: 03/24/2021  PCP: Wynn Banker, MD   Patient coming from: Home   Chief Complaint: SOB, leg swelling   HPI: Linda Lucero is a 87 y.o. female with medical history significant for coronary artery disease, hypertension, hypothyroidism, and chronic kidney disease stage IIIa, now presenting to the emergency department for evaluation of shortness of breath.  Patient reports developing lateral lower leg swelling in the past month and then significant shortness of breath over the past day.  She denies any chest pain associated with this but has had some palpitations, particularly at night when she lays down.  She recently had a cough that resolved and she attributed this to seasonal allergy.  She denies any fevers or chills.  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  ED Course: Upon arrival to the ED, patient is found to be afebrile, saturating well on room air, slightly tachypneic, and hypertensive.  EKG features sinus rhythm with PVCs.  Chest x-ray notable for cardiomegaly and vascular congestion.  Chemistry panel features a creatinine 1.10.  CBC with microcytic anemia, hemoglobin 10.2.  High-sensitivity troponin was 28 and then 30.  BNP elevated 438.  COVID-19 screening test is pending.  Review of Systems:  All other systems reviewed and apart from HPI, are negative.  Past Medical History:  Diagnosis Date  . Coronary artery disease   . Hyperlipidemia   . Hypertension   . Renal artery stenosis (HCC)   . Sleep apnea   . Subclavian arterial stenosis (HCC)   . Systolic murmur   . Thyroid disease   . Ventricular hypertrophy     Past Surgical History:  Procedure Laterality Date  . CHOLECYSTECTOMY      Social History:   reports that she has quit smoking. Her smoking use included cigarettes. She has never used smokeless  tobacco. No history on file for alcohol use and drug use.  Allergies  Allergen Reactions  . Diovan [Valsartan]   . Doxazosin     Family History  Problem Relation Age of Onset  . Heart disease Mother   . High blood pressure Mother   . Lung cancer Father   . Diabetes Maternal Grandmother   . Heart disease Maternal Grandmother      Prior to Admission medications   Medication Sig Start Date End Date Taking? Authorizing Provider  amLODipine (NORVASC) 10 MG tablet Take 1 tablet (10 mg total) by mouth at bedtime. 05/19/20   Wynn Banker, MD  aspirin EC 81 MG tablet Take 81 mg by mouth daily. Swallow whole.    [provider]  cholecalciferol (VITAMIN D) 1000 units tablet Take 1 tablet (1,000 Units total) by mouth daily. 09/06/18   Wynn Banker, MD  cloNIDine (CATAPRES) 0.1 MG tablet Take 1 tablet (0.1 mg total) by mouth 2 (two) times daily. 10/27/20   Wynn Banker, MD  diclofenac Sodium (VOLTAREN) 1 % GEL Apply 2 g topically 4 (four) times daily. 10/27/20   Wynn Banker, MD  hydrALAZINE (APRESOLINE) 50 MG tablet Take 1 tablet (50 mg total) by mouth 3 (three) times daily. Take with food. 10/27/20   Wynn Banker, MD  levothyroxine (SYNTHROID) 75 MCG tablet TAKE 1 TABLET BY MOUTH EVERY DAY 10/25/20   Koberlein, Jannette Spanner C, MD  lisinopril (ZESTRIL) 10 MG tablet Take 1 tablet (10 mg total)  by mouth daily. Patient taking differently: Take 20 mg by mouth daily. 01/07/21   Wynn Banker, MD  Multiple Vitamins-Minerals (MULTIVITAMIN ADULTS PO) Take 1 tablet by mouth daily.     [provider]  traZODone (DESYREL) 50 MG tablet Take 0.5 tablets (25 mg total) by mouth at bedtime. 08/11/20   Wynn Banker, MD    Physical Exam: Vitals:   03/24/21 2045 03/24/21 2145 03/24/21 2245 03/25/21 0015  BP: (!) 179/64 (!) 169/62 (!) 175/67 (!) 197/95  Pulse: 69 (!) 55 (!) 57 65  Resp: (!) 23 14 (!) 23 (!) 25  Temp:      TempSrc:      SpO2: 99% 100%  99% 99%    Constitutional: NAD, calm  Eyes: PERTLA, lids and conjunctivae normal ENMT: Mucous membranes are moist. Posterior pharynx clear of any exudate or lesions.   Neck: supple, no masses  Respiratory: Mild tachypnea, speaking full sentences, no wheezing. No accessory muscle use.  Cardiovascular: S1 & S2 heard, regular rate and rhythm. Mild lower leg edema b/l.  Abdomen: No distension, no tenderness, soft. Bowel sounds active.  Musculoskeletal: no clubbing / cyanosis. No joint deformity upper and lower extremities.   Skin: no significant rashes, lesions, ulcers. Warm, dry, well-perfused. Neurologic: CN 2-12 grossly intact. Sensation intact, DTR normal. Moving all extremities.  Psychiatric: Alert and oriented to person, place, and situation. Very pleasant and cooperative.    Labs and Imaging on Admission: I have personally reviewed following labs and imaging studies  CBC: Recent Labs  Lab 03/24/21 1550  WBC 6.8  NEUTROABS 3.6  HGB 10.2*  HCT 31.7*  MCV 63.8*  PLT 260   Basic Metabolic Panel: Recent Labs  Lab 03/24/21 1550  NA 140  K 3.9  CL 109  CO2 24  GLUCOSE 97  BUN 14  CREATININE 1.10*  CALCIUM 9.5   GFR: CrCl cannot be calculated (Unknown ideal weight.). Liver Function Tests: Recent Labs  Lab 03/24/21 1550  AST 20  ALT 11  ALKPHOS 61  BILITOT 0.7  PROT 7.5  ALBUMIN 3.6   No results for input(s): LIPASE, AMYLASE in the last 168 hours. No results for input(s): AMMONIA in the last 168 hours. Coagulation Profile: No results for input(s): INR, PROTIME in the last 168 hours. Cardiac Enzymes: No results for input(s): CKTOTAL, CKMB, CKMBINDEX, TROPONINI in the last 168 hours. BNP (last 3 results) No results for input(s): PROBNP in the last 8760 hours. HbA1C: No results for input(s): HGBA1C in the last 72 hours. CBG: No results for input(s): GLUCAP in the last 168 hours. Lipid Profile: No results for input(s): CHOL, HDL, LDLCALC, TRIG, CHOLHDL,  LDLDIRECT in the last 72 hours. Thyroid Function Tests: No results for input(s): TSH, T4TOTAL, FREET4, T3FREE, THYROIDAB in the last 72 hours. Anemia Panel: No results for input(s): VITAMINB12, FOLATE, FERRITIN, TIBC, IRON, RETICCTPCT in the last 72 hours. Urine analysis:    Component Value Date/Time   COLORURINE STRAW (A) 05/07/2020 1944   APPEARANCEUR CLEAR 05/07/2020 1944   LABSPEC 1.004 (L) 05/07/2020 1944   PHURINE 7.0 05/07/2020 1944   GLUCOSEU NEGATIVE 05/07/2020 1944   HGBUR SMALL (A) 05/07/2020 1944   BILIRUBINUR NEGATIVE 05/07/2020 1944   KETONESUR NEGATIVE 05/07/2020 1944   PROTEINUR 30 (A) 05/07/2020 1944   NITRITE NEGATIVE 05/07/2020 1944   LEUKOCYTESUR SMALL (A) 05/07/2020 1944   Sepsis Labs: @LABRCNTIP (procalcitonin:4,lacticidven:4) )No results found for this or any previous visit (from the past 240 hour(s)).   Radiological Exams on  Admission: DG Chest 1 View  Result Date: 03/24/2021 CLINICAL DATA:  Shortness of breath EXAM: CHEST  1 VIEW COMPARISON:  08/22/2017 FINDINGS: Mild cardiomegaly with vascular congestion. Aortic atherosclerosis. No consolidation or significant effusion. No pneumothorax. IMPRESSION: No active disease.  Cardiomegaly with vascular congestion Electronically Signed   By: Jasmine Pang M.D.   On: 03/24/2021 16:27    EKG: Independently reviewed. Sinus rhythm, PVCs.   Assessment/Plan   1. Acute on chronic diastolic CHF  - Presents with SOB, reports recent development of b/l leg swelling, and is found to have elevated BNP  - She is not in distress or hypoxic  - EF was preserved on TTE from 2006  - Diurese with Lasix 40 mg IV q12h, monitor weight and I/Os, update echo, monitor renal function and electrolytes, continue lisinopril   2. Severe asymptomatic HTN   - BP as high as 230 systolic in ED, then down to 170/60 without treatment   - No headache, neurologic change, AKI, or chest pain  - Anticipate improvement with diuresis  - Continue  hydralazine, clonidine, and lisinopril   3. CKD IIIa - SCr is 1.10 on admission, appears to be her baseline  - Monitor renal function and electrolytes while diuresing   4. Elevated troponin  - HS troponin mildly elevated, flat, and without chest pain  - Follow-up echocardiogram    DVT prophylaxis: sq heparin  Code Status: DNR, confirmed with patient  Level of Care: Level of care: Telemetry Cardiac Family Communication: Son updated at bedside Disposition Plan:  Patient is from: Home  Anticipated d/c is to: Home  Anticipated d/c date is: Possibly as early as 03/25/21 Patient currently: Pending echocardiogram, improvement in SOB with diuresis  Consults called: None  Admission status: Observation     Briscoe Deutscher, MD [facility]  03/25/2021, 12:37 AM

## 2021-03-24 NOTE — ED Provider Notes (Signed)
Patient noted to be hypertensive in triage.  Patient has not taken any of her evening blood pressure medicines.  We will reorder these and monitor.   Mare Ferrari, PA-C 03/24/21 1845    Koleen Distance, MD 03/24/21 2351

## 2021-03-25 ENCOUNTER — Observation Stay (HOSPITAL_BASED_OUTPATIENT_CLINIC_OR_DEPARTMENT_OTHER): Payer: Medicare HMO

## 2021-03-25 DIAGNOSIS — R7989 Other specified abnormal findings of blood chemistry: Secondary | ICD-10-CM | POA: Insufficient documentation

## 2021-03-25 DIAGNOSIS — I5033 Acute on chronic diastolic (congestive) heart failure: Secondary | ICD-10-CM | POA: Insufficient documentation

## 2021-03-25 DIAGNOSIS — Z7982 Long term (current) use of aspirin: Secondary | ICD-10-CM | POA: Diagnosis not present

## 2021-03-25 DIAGNOSIS — I5031 Acute diastolic (congestive) heart failure: Secondary | ICD-10-CM

## 2021-03-25 DIAGNOSIS — D631 Anemia in chronic kidney disease: Secondary | ICD-10-CM | POA: Diagnosis present

## 2021-03-25 DIAGNOSIS — E890 Postprocedural hypothyroidism: Secondary | ICD-10-CM | POA: Diagnosis not present

## 2021-03-25 DIAGNOSIS — Z66 Do not resuscitate: Secondary | ICD-10-CM | POA: Diagnosis not present

## 2021-03-25 DIAGNOSIS — E876 Hypokalemia: Secondary | ICD-10-CM | POA: Diagnosis present

## 2021-03-25 DIAGNOSIS — E440 Moderate protein-calorie malnutrition: Secondary | ICD-10-CM | POA: Diagnosis not present

## 2021-03-25 DIAGNOSIS — I493 Ventricular premature depolarization: Secondary | ICD-10-CM | POA: Diagnosis not present

## 2021-03-25 DIAGNOSIS — I160 Hypertensive urgency: Secondary | ICD-10-CM | POA: Insufficient documentation

## 2021-03-25 DIAGNOSIS — Z20822 Contact with and (suspected) exposure to covid-19: Secondary | ICD-10-CM | POA: Diagnosis not present

## 2021-03-25 DIAGNOSIS — I10 Essential (primary) hypertension: Secondary | ICD-10-CM

## 2021-03-25 DIAGNOSIS — N1831 Chronic kidney disease, stage 3a: Secondary | ICD-10-CM | POA: Diagnosis not present

## 2021-03-25 DIAGNOSIS — I509 Heart failure, unspecified: Secondary | ICD-10-CM | POA: Diagnosis not present

## 2021-03-25 DIAGNOSIS — G4730 Sleep apnea, unspecified: Secondary | ICD-10-CM | POA: Diagnosis present

## 2021-03-25 DIAGNOSIS — Z87891 Personal history of nicotine dependence: Secondary | ICD-10-CM | POA: Diagnosis not present

## 2021-03-25 DIAGNOSIS — R778 Other specified abnormalities of plasma proteins: Secondary | ICD-10-CM | POA: Insufficient documentation

## 2021-03-25 DIAGNOSIS — I130 Hypertensive heart and chronic kidney disease with heart failure and stage 1 through stage 4 chronic kidney disease, or unspecified chronic kidney disease: Secondary | ICD-10-CM | POA: Diagnosis not present

## 2021-03-25 DIAGNOSIS — Z8249 Family history of ischemic heart disease and other diseases of the circulatory system: Secondary | ICD-10-CM | POA: Diagnosis not present

## 2021-03-25 DIAGNOSIS — E039 Hypothyroidism, unspecified: Secondary | ICD-10-CM | POA: Diagnosis not present

## 2021-03-25 DIAGNOSIS — E785 Hyperlipidemia, unspecified: Secondary | ICD-10-CM | POA: Diagnosis present

## 2021-03-25 DIAGNOSIS — Z79899 Other long term (current) drug therapy: Secondary | ICD-10-CM | POA: Diagnosis not present

## 2021-03-25 DIAGNOSIS — R531 Weakness: Secondary | ICD-10-CM | POA: Diagnosis not present

## 2021-03-25 DIAGNOSIS — Z79890 Hormone replacement therapy (postmenopausal): Secondary | ICD-10-CM | POA: Diagnosis not present

## 2021-03-25 DIAGNOSIS — I2510 Atherosclerotic heart disease of native coronary artery without angina pectoris: Secondary | ICD-10-CM | POA: Diagnosis not present

## 2021-03-25 DIAGNOSIS — Z888 Allergy status to other drugs, medicaments and biological substances status: Secondary | ICD-10-CM | POA: Diagnosis not present

## 2021-03-25 DIAGNOSIS — Z6823 Body mass index (BMI) 23.0-23.9, adult: Secondary | ICD-10-CM | POA: Diagnosis not present

## 2021-03-25 LAB — CBC
HCT: 28.4 % — ABNORMAL LOW (ref 36.0–46.0)
Hemoglobin: 9.5 g/dL — ABNORMAL LOW (ref 12.0–15.0)
MCH: 21.1 pg — ABNORMAL LOW (ref 26.0–34.0)
MCHC: 33.5 g/dL (ref 30.0–36.0)
MCV: 63.1 fL — ABNORMAL LOW (ref 80.0–100.0)
Platelets: 255 10*3/uL (ref 150–400)
RBC: 4.5 MIL/uL (ref 3.87–5.11)
RDW: 19.4 % — ABNORMAL HIGH (ref 11.5–15.5)
WBC: 6.2 10*3/uL (ref 4.0–10.5)
nRBC: 0 % (ref 0.0–0.2)

## 2021-03-25 LAB — BASIC METABOLIC PANEL
Anion gap: 7 (ref 5–15)
BUN: 13 mg/dL (ref 8–23)
CO2: 25 mmol/L (ref 22–32)
Calcium: 9 mg/dL (ref 8.9–10.3)
Chloride: 108 mmol/L (ref 98–111)
Creatinine, Ser: 1.15 mg/dL — ABNORMAL HIGH (ref 0.44–1.00)
GFR, Estimated: 45 mL/min — ABNORMAL LOW (ref >60)
Glucose, Bld: 116 mg/dL — ABNORMAL HIGH (ref 70–99)
Potassium: 3.4 mmol/L — ABNORMAL LOW (ref 3.5–5.1)
Sodium: 140 mmol/L (ref 135–145)

## 2021-03-25 LAB — ECHOCARDIOGRAM COMPLETE
AR max vel: 1.77 cm2
AV Area VTI: 1.83 cm2
AV Area mean vel: 1.78 cm2
AV Mean grad: 7 mmHg
AV Peak grad: 13.1 mmHg
Ao pk vel: 1.81 m/s
Area-P 1/2: 1.61 cm2
Height: 66 in
MV VTI: 3.15 cm2
S' Lateral: 2.8 cm
Weight: 2528 oz

## 2021-03-25 LAB — TROPONIN I (HIGH SENSITIVITY)
Troponin I (High Sensitivity): 32 ng/L — ABNORMAL HIGH (ref <18)
Troponin I (High Sensitivity): 28 ng/L — ABNORMAL HIGH (ref <18)

## 2021-03-25 LAB — SARS CORONAVIRUS 2 (TAT 6-24 HRS): SARS Coronavirus 2: NEGATIVE

## 2021-03-25 MED ORDER — METOPROLOL TARTRATE 5 MG/5ML IV SOLN
2.5000 mg | Freq: Once | INTRAVENOUS | Status: AC
  Administered 2021-03-25: 2.5 mg via INTRAVENOUS
  Filled 2021-03-25: qty 5

## 2021-03-25 MED ORDER — METOPROLOL TARTRATE 5 MG/5ML IV SOLN: 5.0000 mg | Freq: Once | INTRAVENOUS | Status: DC

## 2021-03-25 MED ORDER — LISINOPRIL 10 MG PO TABS
10.0000 mg | ORAL_TABLET | Freq: Every day | ORAL | Status: DC
  Administered 2021-03-25: 10 mg via ORAL
  Filled 2021-03-25 (×2): qty 1

## 2021-03-25 MED ORDER — AMLODIPINE BESYLATE 10 MG PO TABS
10.0000 mg | ORAL_TABLET | Freq: Every day | ORAL | Status: DC
  Administered 2021-03-25 – 2021-03-28 (×4): 10 mg via ORAL
  Filled 2021-03-25 (×4): qty 1

## 2021-03-25 MED ORDER — HYDRALAZINE HCL 50 MG PO TABS
50.0000 mg | ORAL_TABLET | Freq: Three times a day (TID) | ORAL | Status: DC
  Administered 2021-03-25 – 2021-03-29 (×12): 50 mg via ORAL
  Filled 2021-03-25 (×8): qty 1
  Filled 2021-03-25: qty 2
  Filled 2021-03-25 (×3): qty 1
  Filled 2021-03-25 (×2): qty 2

## 2021-03-25 MED ORDER — NITROGLYCERIN 2 % TD OINT
0.5000 [in_us] | TOPICAL_OINTMENT | Freq: Four times a day (QID) | TRANSDERMAL | Status: DC
  Administered 2021-03-25 – 2021-03-26 (×2): 0.5 [in_us] via TOPICAL
  Filled 2021-03-25: qty 30
  Filled 2021-03-25: qty 1

## 2021-03-25 MED ORDER — CLONIDINE HCL 0.1 MG PO TABS
0.1000 mg | ORAL_TABLET | Freq: Two times a day (BID) | ORAL | Status: DC
  Administered 2021-03-25 – 2021-03-29 (×9): 0.1 mg via ORAL
  Filled 2021-03-25 (×10): qty 1

## 2021-03-25 NOTE — ED Notes (Signed)
Received verbal report from Kami B. RN 

## 2021-03-25 NOTE — ED Notes (Signed)
Spoke with provider reference pt symptoms. Requested an EKG, changed the order for bp medication and is requesting a repeat troponin

## 2021-03-25 NOTE — Plan of Care (Signed)
  Problem: Education: Goal: Knowledge of General Education information will improve Description: Including pain rating scale, medication(s)/side effects and non-pharmacologic comfort measures Outcome: Progressing   Problem: Health Behavior/Discharge Planning: Goal: Ability to manage health-related needs will improve Outcome: Progressing   Problem: Clinical Measurements: Goal: Will remain free from infection Outcome: Progressing Goal: Respiratory complications will improve Outcome: Progressing Goal: Cardiovascular complication will be avoided Outcome: Progressing   Problem: Activity: Goal: Risk for activity intolerance will decrease Outcome: Progressing   Problem: Nutrition: Goal: Adequate nutrition will be maintained Outcome: Progressing   Problem: Coping: Goal: Level of anxiety will decrease Outcome: Progressing   Problem: Elimination: Goal: Will not experience complications related to bowel motility Outcome: Progressing Goal: Will not experience complications related to urinary retention Outcome: Progressing   Problem: Pain Managment: Goal: General experience of comfort will improve Outcome: Progressing   Problem: Safety: Goal: Ability to remain free from injury will improve Outcome: Progressing   Problem: Skin Integrity: Goal: Risk for impaired skin integrity will decrease Outcome: Progressing   Problem: Clinical Measurements: Goal: Ability to maintain clinical measurements within normal limits will improve Outcome: Not Progressing Goal: Diagnostic test results will improve Outcome: Not Progressing   

## 2021-03-25 NOTE — ED Notes (Signed)
Justice Rocher son wishes to be called if pt gets a room or if something changes at [phone]

## 2021-03-25 NOTE — ED Notes (Signed)
Pt c/o feels like her heart is pounding out of her chest. States it is better depending on position that she sits in. CP initially when this started but no longer has the pain only has the feeling of her heart pounding out of her chest

## 2021-03-25 NOTE — ED Notes (Signed)
Spoke with provider reference pt bp and if pt could have something to drink

## 2021-03-25 NOTE — Progress Notes (Signed)
  Echocardiogram 2D Echocardiogram has been performed.  Linda Lucero 03/25/2021, 10:07 AM

## 2021-03-25 NOTE — ED Notes (Signed)
Watching TV, denies pain/discomfort/SOB.

## 2021-03-25 NOTE — ED Notes (Signed)
A/O, no distress. Purwick in place.

## 2021-03-25 NOTE — ED Notes (Signed)
Pt states that she is no longer having chest pain and the pounding in her chest is starting to go away. Denies any other needs at this time

## 2021-03-25 NOTE — ED Notes (Signed)
Provider at bedside

## 2021-03-25 NOTE — ED Notes (Signed)
ECHO in progress- 

## 2021-03-25 NOTE — ED Notes (Signed)
Pure wick placed on patient at this time. Working correctly. Will continue to monitor patient closely.

## 2021-03-25 NOTE — Progress Notes (Signed)
PROGRESS NOTE  Linda MccreedyDorothy M Linda Lucero [id number] DOB: [birth year] DOA: 03/24/2021 PCP: Wynn BankerKoberlein, Junell C, MD   LOS: 0 days   Brief narrative:  Linda MccreedyDorothy M Linda Lucero is a 87 y.o. female with medical history significant for coronary artery disease, hypertension, hypothyroidism, and chronic kidney disease stage IIIa, who presented to the emergency department shortness of breath and some leg swelling for a month.  She was so short of breath for the last 1 day that she could not breathe and had some palpitations.  She has been having some dry cough for some duration now.  In the ED patient was hypertensive.  EKG showed some sinus rhythm with PVC.  Chest x-ray was notable for cardiomegaly and vascular congestion.  Creatinine was 1.0.  High-sensitivity troponin was borderline elevated at 28 and 30.  BNP elevated at 438.  Patient was then admitted to hospital for congestive heart failure.  Assessment/Plan:  Principal Problem:   Acute CHF (HCC) Active Problems:   Hypertension   Chronic kidney disease, stage 3a (HCC)   Hypothyroidism   Coronary artery disease  Acute  diastolic CHF  likely. Elevated BNP.  Will check 2D echocardiogram.  Receiving 40 mg IV Lasix twice a day.  Strict intake and output charting.  On lisinopril.  We will continue to monitor closely.  Accelerated HTN   - BP as high as 230 systolic in ED, still accelerated.  Continue hydralazine, clonidine and lisinopril.  Continue with IV diuresis.  Add Nitropaste.  Mild hypokalemia.  Will replace while on diuretics.  Check levels in a.m.  CKD IIIa - SCr is 1.10 on admission, appears to be her baseline.  Continue to monitor for renal function while on IV diuretics.  Elevated troponin  Mild elevated but flat without any chest pain or EKG changes.  Check 2D echocardiogram.  Likely secondary to decompensated heart failure.  DVT prophylaxis: heparin injection 5,000 Units Start: 03/25/21 1400   Code Status: DNR  Family Communication:   I spoke with the patient's son on the phone and updated her about the clinical condition of the patient.  Status is: Observation  The patient will require care spanning > 2 midnights and should be moved to inpatient because: Unsafe d/c plan, IV treatments appropriate due to intensity of illness or inability to take PO, Inpatient level of care appropriate due to severity of illness and IV diuresis  Dispo: The patient is from: Home              Anticipated d/c is to: Home              Patient currently is not medically stable to d/c.   Difficult to place patient No   Consultants:  None  Procedures:  None  Anti-infectives:  . None  Anti-infectives (From admission, onward)   None     Subjective: Today, patient was seen and examined at bedside.  Feels little better with breathing.  Denies any nausea, vomiting, fever, chills or increasing dyspnea.  Mild shortness of breath.  Objective: Vitals:   03/25/21 0730 03/25/21 1000  BP: (!) 164/63 (!) 178/109  Pulse: (!) 59 71  Resp: 19 19  Temp: 97.8 F (36.6 C)   SpO2: 99% 100%    Intake/Output Summary (Last 24 hours) at 03/25/2021 1319 Last data filed at 03/25/2021 1142 Gross per 24 hour  Intake --  Output 2700 ml  Net -2700 ml   Filed Weights   03/25/21 0154  Weight: 71.7 kg   Body mass  index is 25.5 kg/m.   Physical Exam: GENERAL: Patient is alert awake and oriented. Not in obvious distress. HENT: No scleral pallor or icterus. Pupils equally reactive to light. Oral mucosa is moist NECK: is supple, no gross swelling noted. CHEST: Clear to auscultation.   Diminished breath sounds bilaterally. CVS: S1 and S2 heard, no murmur. Regular rate and rhythm.  ABDOMEN: Soft, non-tender, bowel sounds are present. EXTREMITIES: Bilateral lower extremity trace edema noted CNS: Cranial nerves are intact. No focal motor deficits. SKIN: warm and dry without rashes.  Data Review: I have personally reviewed the following  laboratory data and studies,  CBC: Recent Labs  Lab 03/24/21 1550 03/25/21 0612  WBC 6.8 6.2  NEUTROABS 3.6  --   HGB 10.2* 9.5*  HCT 31.7* 28.4*  MCV 63.8* 63.1*  PLT 260 255   Basic Metabolic Panel: Recent Labs  Lab 03/24/21 1550 03/25/21 0308  NA 140 140  K 3.9 3.4*  CL 109 108  CO2 24 25  GLUCOSE 97 116*  BUN 14 13  CREATININE 1.10* 1.15*  CALCIUM 9.5 9.0   Liver Function Tests: Recent Labs  Lab 03/24/21 1550  AST 20  ALT 11  ALKPHOS 61  BILITOT 0.7  PROT 7.5  ALBUMIN 3.6   No results for input(s): LIPASE, AMYLASE in the last 168 hours. No results for input(s): AMMONIA in the last 168 hours. Cardiac Enzymes: No results for input(s): CKTOTAL, CKMB, CKMBINDEX, TROPONINI in the last 168 hours. BNP (last 3 results) Recent Labs    03/24/21 1947  BNP 438.2*    ProBNP (last 3 results) No results for input(s): PROBNP in the last 8760 hours.  CBG: No results for input(s): GLUCAP in the last 168 hours. Recent Results (from the past 240 hour(s))  SARS CORONAVIRUS 2 (TAT 6-24 HRS) Nasopharyngeal Nasopharyngeal Swab     Status: None   Collection Time: 03/24/21  8:14 PM   Specimen: Nasopharyngeal Swab  Result Value Ref Range Status   SARS Coronavirus 2 NEGATIVE NEGATIVE Final    Comment: (NOTE) SARS-CoV-2 target nucleic acids are NOT DETECTED.  The SARS-CoV-2 RNA is generally detectable in upper and lower respiratory specimens during the acute phase of infection. Negative results do not preclude SARS-CoV-2 infection, do not rule out co-infections with other pathogens, and should not be used as the sole basis for treatment or other patient management decisions. Negative results must be combined with clinical observations, patient history, and epidemiological information. The expected result is Negative.  Fact Sheet for Patients: HairSlick.no  Fact Sheet for Healthcare  Providers: quierodirigir.com  This test is not yet approved or cleared by the [place] FDA and  has been authorized for detection and/or diagnosis of SARS-CoV-2 by FDA under an Emergency Use Authorization (EUA). This EUA will remain  in effect (meaning this test can be used) for the duration of the COVID-19 declaration under Se ction 564(b)(1) of the Act, 21 U.S.C. section 360bbb-3(b)(1), unless the authorization is terminated or revoked sooner.  Performed at [facility], [address] [place], [place] [address]      Studies: DG Chest 1 View  Result Date: 03/24/2021 CLINICAL DATA:  Shortness of breath EXAM: CHEST  1 VIEW COMPARISON:  08/22/2017 FINDINGS: Mild cardiomegaly with vascular congestion. Aortic atherosclerosis. No consolidation or significant effusion. No pneumothorax. IMPRESSION: No active disease.  Cardiomegaly with vascular congestion Electronically Signed   By: Jasmine Pang M.D.   On: 03/24/2021 16:27   ECHOCARDIOGRAM COMPLETE  Result Date:  03/25/2021    ECHOCARDIOGRAM REPORT   Patient Name:   Linda Linda Lucero Date of Exam: 03/25/2021 Medical Rec #:  [id number]       Height:       66.0 in Accession #:    [id number]      Weight:       158.0 lb Date of Birth:  1928/04/20      BSA:          1.809 m Patient Age:    87 years        BP:           164/63 mmHg Patient Gender: F               HR:           59 bpm. Exam Location:  Inpatient Procedure: 2D Echo, Color Doppler and Cardiac Doppler Indications:    CHF-Acute Diastolic  History:        Patient has prior history of Echocardiogram examinations, most                 recent 10/24/2005. CHF; Risk Factors:Hypertension. CKD. Elevated                 troponin.  Sonographer:    Ross Ludwig RDCS (AE) Referring Phys: [id number] TIMOTHY S OPYD  Sonographer Comments: Suboptimal subcostal window. IMPRESSIONS  1. Left ventricular ejection fraction, by estimation, is 55 to 60%. The left ventricle has normal  function. The left ventricle has no regional wall motion abnormalities. There is severe concentric left ventricular hypertrophy. Left ventricular diastolic  parameters are consistent with Grade I diastolic dysfunction (impaired relaxation).  2. Right ventricular systolic function is normal. The right ventricular size is normal.  3. Left atrial size was severely dilated.  4. Right atrial size was mildly dilated.  5. The mitral valve is normal in structure. Trivial mitral valve regurgitation.  6. The aortic valve is tricuspid. There is mild calcification of the aortic valve. There is mild thickening of the aortic valve. Aortic valve regurgitation is not visualized. Mild to moderate aortic valve sclerosis/calcification is present, without any evidence of aortic stenosis. Comparison(s): Compared to prior echo report in 2006, there is now severe LVH with moderate aortic sclerosis without significant stenosis. FINDINGS  Left Ventricle: Left ventricular ejection fraction, by estimation, is 55 to 60%. The left ventricle has normal function. The left ventricle has no regional wall motion abnormalities. The left ventricular internal cavity size was normal in size. There is  severe concentric left ventricular hypertrophy. Left ventricular diastolic parameters are consistent with Grade I diastolic dysfunction (impaired relaxation). Right Ventricle: The right ventricular size is normal. No increase in right ventricular wall thickness. Right ventricular systolic function is normal. Left Atrium: Left atrial size was severely dilated. Right Atrium: Right atrial size was mildly dilated. Pericardium: There is no evidence of pericardial effusion. Mitral Valve: The mitral valve is normal in structure. There is mild thickening of the mitral valve leaflet(s). There is mild calcification of the mitral valve leaflet(s). Mild mitral annular calcification. Trivial mitral valve regurgitation. MV peak gradient, 4.0 mmHg. The mean mitral valve  gradient is 1.0 mmHg. Tricuspid Valve: The tricuspid valve is normal in structure. Tricuspid valve regurgitation is trivial. Aortic Valve: The aortic valve is tricuspid. There is mild calcification of the aortic valve. There is mild thickening of the aortic valve. Aortic valve regurgitation is not visualized. Mild to moderate aortic valve sclerosis/calcification is present, without any evidence of aortic  stenosis. Aortic valve mean gradient measures 7.0 mmHg. Aortic valve peak gradient measures 13.1 mmHg. Aortic valve area, by VTI measures 1.83 cm. Pulmonic Valve: The pulmonic valve was normal in structure. Pulmonic valve regurgitation is trivial. Aorta: The aortic root is normal in size and structure. Venous: The inferior vena cava was not well visualized. IAS/Shunts: No atrial level shunt detected by color flow Doppler.  LEFT VENTRICLE PLAX 2D LVIDd:         3.90 cm  Diastology LVIDs:         2.80 cm  LV e' medial:    0.03 cm/s LV PW:         2.00 cm  LV E/e' medial:  14.3 LV IVS:        2.60 cm  LV e' lateral:   0.05 cm/s LVOT diam:     1.90 cm  LV E/e' lateral: 10.6 LV SV:         66 LV SV Index:   37 LVOT Area:     2.84 cm  RIGHT VENTRICLE RV Basal diam:  2.40 cm RV S prime:     15.20 cm/s TAPSE (M-mode): 2.9 cm LEFT ATRIUM              Index       RIGHT ATRIUM           Index LA diam:        3.20 cm  1.77 cm/m  RA Area:     15.60 cm LA Vol (A2C):   125.0 ml 69.09 ml/m RA Volume:   41.40 ml  22.88 ml/m LA Vol (A4C):   102.0 ml 56.38 ml/m LA Biplane Vol: 116.0 ml 64.12 ml/m  AORTIC VALVE AV Area (Vmax):    1.77 cm AV Area (Vmean):   1.78 cm AV Area (VTI):     1.83 cm AV Vmax:           181.00 cm/s AV Vmean:          116.000 cm/s AV VTI:            0.362 m AV Peak Grad:      13.1 mmHg AV Mean Grad:      7.0 mmHg LVOT Vmax:         113.00 cm/s LVOT Vmean:        72.700 cm/s LVOT VTI:          0.234 m LVOT/AV VTI ratio: 0.65  AORTA Ao Root diam: 3.00 cm Ao Asc diam:  2.20 cm MITRAL VALVE                TRICUSPID VALVE MV Area (PHT): 1.61 cm    TR Peak grad:   21.2 mmHg MV Area VTI:   3.15 cm    TR Vmax:        230.00 cm/s MV Peak grad:  4.0 mmHg MV Mean grad:  1.0 mmHg    SHUNTS MV Vmax:       1.00 m/s    Systemic VTI:  0.23 m MV Vmean:      46.7 cm/s   Systemic Diam: 1.90 cm MV Decel Time: 470 msec MV E velocity: 0.48 cm/s MV A velocity: 81.80 cm/s MV E/A ratio:  0.01 Laurance Flatten MD Electronically signed by Laurance Flatten MD Signature Date/Time: 03/25/2021/12:11:47 PM    Final      Joycelyn Das, MD  [facility] 03/25/2021  If 7PM-7AM, please contact night-coverage

## 2021-03-26 DIAGNOSIS — N1831 Chronic kidney disease, stage 3a: Secondary | ICD-10-CM | POA: Diagnosis not present

## 2021-03-26 DIAGNOSIS — I10 Essential (primary) hypertension: Secondary | ICD-10-CM | POA: Diagnosis not present

## 2021-03-26 DIAGNOSIS — E890 Postprocedural hypothyroidism: Secondary | ICD-10-CM | POA: Diagnosis not present

## 2021-03-26 DIAGNOSIS — I509 Heart failure, unspecified: Secondary | ICD-10-CM | POA: Diagnosis not present

## 2021-03-26 LAB — BASIC METABOLIC PANEL
Anion gap: 8 (ref 5–15)
BUN: 23 mg/dL (ref 8–23)
CO2: 27 mmol/L (ref 22–32)
Calcium: 9.2 mg/dL (ref 8.9–10.3)
Chloride: 103 mmol/L (ref 98–111)
Creatinine, Ser: 1.58 mg/dL — ABNORMAL HIGH (ref 0.44–1.00)
GFR, Estimated: 31 mL/min — ABNORMAL LOW (ref >60)
Glucose, Bld: 97 mg/dL (ref 70–99)
Potassium: 3.6 mmol/L (ref 3.5–5.1)
Sodium: 138 mmol/L (ref 135–145)

## 2021-03-26 LAB — CBC
HCT: 27 % — ABNORMAL LOW (ref 36.0–46.0)
Hemoglobin: 9.2 g/dL — ABNORMAL LOW (ref 12.0–15.0)
MCH: 21.1 pg — ABNORMAL LOW (ref 26.0–34.0)
MCHC: 34.1 g/dL (ref 30.0–36.0)
MCV: 61.9 fL — ABNORMAL LOW (ref 80.0–100.0)
Platelets: 236 10*3/uL (ref 150–400)
RBC: 4.36 MIL/uL (ref 3.87–5.11)
RDW: 19.1 % — ABNORMAL HIGH (ref 11.5–15.5)
WBC: 6.1 10*3/uL (ref 4.0–10.5)
nRBC: 0 % (ref 0.0–0.2)

## 2021-03-26 LAB — TSH: TSH: 6.942 u[IU]/mL — ABNORMAL HIGH (ref 0.350–4.500)

## 2021-03-26 LAB — MAGNESIUM: Magnesium: 2.1 mg/dL (ref 1.7–2.4)

## 2021-03-26 LAB — PHOSPHORUS: Phosphorus: 4.4 mg/dL (ref 2.5–4.6)

## 2021-03-26 NOTE — Progress Notes (Signed)
PROGRESS NOTE  Linda Lucero DOB: Sep 06, 1928 DOA: 03/24/2021 PCP: Wynn Banker, MD   LOS: 1 day   Brief narrative:  Linda Lucero is a 87 y.o. female with medical history significant for coronary artery disease, hypertension, hypothyroidism, and chronic kidney disease stage IIIa, who presented to the emergency department shortness of breath and some leg swelling for a month.  She was so short of breath for the last 1 day that she could not breathe and had some palpitations.  She has been having some dry cough for some duration now.  In the ED, patient was hypertensive.  EKG showed some sinus rhythm with PVC.  Chest x-ray was notable for cardiomegaly and vascular congestion.  Creatinine was 1.0.  High-sensitivity troponin was borderline elevated at 28 and 30.  BNP elevated at 438.  Patient was then admitted to hospital for congestive heart failure.  Assessment/Plan:  Principal Problem:   Acute CHF (HCC) Active Problems:   Hypertension   Chronic kidney disease, stage 3a (HCC)   Hypothyroidism   Coronary artery disease   CHF (congestive heart failure) (HCC)  Acute  diastolic CHF   Elevated BNP.  2D echocardiogram was performed which showed LV ejection fraction of 55 to 60% with diastolic dysfunction.  On IV Lasix twice a day and lisinopril.  Will hold for now due to rising creatinine..  Monitor renal function.  Negative balance for 3457 mL.  Accelerated HTN   - BP as high as 230 systolic in ED, significantly improved at this time.  Will discontinue lisinopril for elevated creatinine.  Discontinue Nitropaste.  Continue hydralazine, clonidine and lisinopril.    Mild hypokalemia.  Improved.  Continue replacement.  CKD IIIa - SCr is 1.10 on admission, elevated to 1.5 today.  We will continue to monitor closely.  Elevated troponin  Mildly elevated but flat without any chest pain or EKG changes.   Likely secondary to decompensated heart failure.  2D echo  noted  DVT prophylaxis: heparin injection 5,000 Units Start: 03/25/21 1400   Code Status: DNR  Family Communication:  None today.  I spoke with the patient's son on the phone and updated her about the clinical condition of the patient.  Status is: Inpatient  The patient will require care spanning > 2 midnights and should be moved to inpatient because: Unsafe d/c plan, IV treatments appropriate due to intensity of illness or inability to take PO, Inpatient level of care appropriate due to severity of illness and IV diuresis  Dispo: The patient is from: Home              Anticipated d/c is to: Home likely tomorrow.  Will get PT evaluation.  Monitor creatinine               Patient currently is not medically stable to d/c.   Difficult to place patient No   Consultants:  None  Procedures:  None  Anti-infectives:  . None  Anti-infectives (From admission, onward)   None     Subjective: Today, patient was seen and examined at bedside.  Feels better with breathing.  Has mild cough.  Leg swelling has improved.  Has been diuresing.  Denies any chest pain, dizziness, lightheadedness.   Objective: Vitals:   03/26/21 0600 03/26/21 0733  BP: (!) 151/56 (!) 125/48  Pulse: 62 65  Resp: 20 16  Temp:  99 F (37.2 C)  SpO2: 99% 98%    Intake/Output Summary (Last 24 hours) at 03/26/2021 1023  Last data filed at 03/26/2021 0300 Gross per 24 hour  Intake 393 ml  Output 2200 ml  Net -1807 ml   Filed Weights   03/25/21 0154 03/25/21 1804 03/26/21 0300  Weight: 71.7 kg 64.9 kg 66.9 kg   Body mass index is 23.81 kg/m.   Physical Exam: GENERAL: Patient is alert awake and oriented. Not in obvious distress. HENT: No scleral pallor or icterus. Pupils equally reactive to light. Oral mucosa is moist NECK: is supple, no gross swelling noted. CHEST diminished breath sounds bilaterally.  No crackles noted.. CVS: S1 and S2 heard, no murmur. Regular rate and rhythm.  ABDOMEN: Soft,  non-tender, bowel sounds are present. EXTREMITIES: Bilateral lower extremity trace edema has improved. CNS: Cranial nerves are intact. No focal motor deficits. SKIN: warm and dry without rashes.  Data Review: I have personally reviewed the following laboratory data and studies,  CBC: Recent Labs  Lab 03/24/21 1550 03/25/21 0612 03/26/21 0014  WBC 6.8 6.2 6.1  NEUTROABS 3.6  --   --   HGB 10.2* 9.5* 9.2*  HCT 31.7* 28.4* 27.0*  MCV 63.8* 63.1* 61.9*  PLT 260 255 236   Basic Metabolic Panel: Recent Labs  Lab 03/24/21 1550 03/25/21 0308 03/26/21 0014  NA 140 140 138  K 3.9 3.4* 3.6  CL 109 108 103  CO2 24 25 27   GLUCOSE 97 116* 97  BUN 14 13 23   CREATININE 1.10* 1.15* 1.58*  CALCIUM 9.5 9.0 9.2  MG  --   --  2.1  PHOS  --   --  4.4   Liver Function Tests: Recent Labs  Lab 03/24/21 1550  AST 20  ALT 11  ALKPHOS 61  BILITOT 0.7  PROT 7.5  ALBUMIN 3.6   No results for input(s): LIPASE, AMYLASE in the last 168 hours. No results for input(s): AMMONIA in the last 168 hours. Cardiac Enzymes: No results for input(s): CKTOTAL, CKMB, CKMBINDEX, TROPONINI in the last 168 hours. BNP (last 3 results) Recent Labs    03/24/21 1947  BNP 438.2*    ProBNP (last 3 results) No results for input(s): PROBNP in the last 8760 hours.  CBG: No results for input(s): GLUCAP in the last 168 hours. Recent Results (from the past 240 hour(s))  SARS CORONAVIRUS 2 (TAT 6-24 HRS) Nasopharyngeal Nasopharyngeal Swab     Status: None   Collection Time: 03/24/21  8:14 PM   Specimen: Nasopharyngeal Swab  Result Value Ref Range Status   SARS Coronavirus 2 NEGATIVE NEGATIVE Final    Comment: (NOTE) SARS-CoV-2 target nucleic acids are NOT DETECTED.  The SARS-CoV-2 RNA is generally detectable in upper and lower respiratory specimens during the acute phase of infection. Negative results do not preclude SARS-CoV-2 infection, do not rule out co-infections with other pathogens, and should  not be used as the sole basis for treatment or other patient management decisions. Negative results must be combined with clinical observations, patient history, and epidemiological information. The expected result is Negative.  Fact Sheet for Patients: 03/26/21  Fact Sheet for Healthcare Providers: 03/26/21  This test is not yet approved or cleared by the [email or web address] FDA and  has been authorized for detection and/or diagnosis of SARS-CoV-2 by FDA under an Emergency Use Authorization (EUA). This EUA will remain  in effect (meaning this test can be used) for the duration of the COVID-19 declaration under Se ction 564(b)(1) of the Act, 21 U.S.C. section 360bbb-3(b)(1), unless the authorization is terminated or revoked sooner.  Performed at  [facility], [address] [place], [place] [address]      Studies: DG Chest 1 View  Result Date: 03/24/2021 CLINICAL DATA:  Shortness of breath EXAM: CHEST  1 VIEW COMPARISON:  08/22/2017 FINDINGS: Mild cardiomegaly with vascular congestion. Aortic atherosclerosis. No consolidation or significant effusion. No pneumothorax. IMPRESSION: No active disease.  Cardiomegaly with vascular congestion Electronically Signed   By: Jasmine PangKim  Fujinaga M.D.   On: 03/24/2021 16:27   ECHOCARDIOGRAM COMPLETE  Result Date: 03/25/2021    ECHOCARDIOGRAM REPORT   Patient Name:   Warner MccreedyDOROTHY M Kettles Date of Exam: 03/25/2021 Medical Rec #:  [id number]       Height:       66.0 in Accession #:    [id number]      Weight:       158.0 lb Date of Birth:  04-Dec-1927      BSA:          1.809 m Patient Age:    89 years        BP:           164/63 mmHg Patient Gender: F               HR:           59 bpm. Exam Location:  Inpatient Procedure: 2D Echo, Color Doppler and Cardiac Doppler Indications:    CHF-Acute Diastolic  History:        Patient has prior history of Echocardiogram examinations, most                  recent 10/24/2005. CHF; Risk Factors:Hypertension. CKD. Elevated                 troponin.  Sonographer:    Ross LudwigArthur Guy RDCS (AE) Referring Phys: [id number] TIMOTHY S OPYD  Sonographer Comments: Suboptimal subcostal window. IMPRESSIONS  1. Left ventricular ejection fraction, by estimation, is 55 to 60%. The left ventricle has normal function. The left ventricle has no regional wall motion abnormalities. There is severe concentric left ventricular hypertrophy. Left ventricular diastolic  parameters are consistent with Grade I diastolic dysfunction (impaired relaxation).  2. Right ventricular systolic function is normal. The right ventricular size is normal.  3. Left atrial size was severely dilated.  4. Right atrial size was mildly dilated.  5. The mitral valve is normal in structure. Trivial mitral valve regurgitation.  6. The aortic valve is tricuspid. There is mild calcification of the aortic valve. There is mild thickening of the aortic valve. Aortic valve regurgitation is not visualized. Mild to moderate aortic valve sclerosis/calcification is present, without any evidence of aortic stenosis. Comparison(s): Compared to prior echo report in 2006, there is now severe LVH with moderate aortic sclerosis without significant stenosis. FINDINGS  Left Ventricle: Left ventricular ejection fraction, by estimation, is 55 to 60%. The left ventricle has normal function. The left ventricle has no regional wall motion abnormalities. The left ventricular internal cavity size was normal in size. There is  severe concentric left ventricular hypertrophy. Left ventricular diastolic parameters are consistent with Grade I diastolic dysfunction (impaired relaxation). Right Ventricle: The right ventricular size is normal. No increase in right ventricular wall thickness. Right ventricular systolic function is normal. Left Atrium: Left atrial size was severely dilated. Right Atrium: Right atrial size was mildly dilated. Pericardium: There is  no evidence of pericardial effusion. Mitral Valve: The mitral valve is normal in structure. There is mild thickening of the mitral valve leaflet(s). There is  mild calcification of the mitral valve leaflet(s). Mild mitral annular calcification. Trivial mitral valve regurgitation. MV peak gradient, 4.0 mmHg. The mean mitral valve gradient is 1.0 mmHg. Tricuspid Valve: The tricuspid valve is normal in structure. Tricuspid valve regurgitation is trivial. Aortic Valve: The aortic valve is tricuspid. There is mild calcification of the aortic valve. There is mild thickening of the aortic valve. Aortic valve regurgitation is not visualized. Mild to moderate aortic valve sclerosis/calcification is present, without any evidence of aortic stenosis. Aortic valve mean gradient measures 7.0 mmHg. Aortic valve peak gradient measures 13.1 mmHg. Aortic valve area, by VTI measures 1.83 cm. Pulmonic Valve: The pulmonic valve was normal in structure. Pulmonic valve regurgitation is trivial. Aorta: The aortic root is normal in size and structure. Venous: The inferior vena cava was not well visualized. IAS/Shunts: No atrial level shunt detected by color flow Doppler.  LEFT VENTRICLE PLAX 2D LVIDd:         3.90 cm  Diastology LVIDs:         2.80 cm  LV e' medial:    0.03 cm/s LV PW:         2.00 cm  LV E/e' medial:  14.3 LV IVS:        2.60 cm  LV e' lateral:   0.05 cm/s LVOT diam:     1.90 cm  LV E/e' lateral: 10.6 LV SV:         66 LV SV Index:   37 LVOT Area:     2.84 cm  RIGHT VENTRICLE RV Basal diam:  2.40 cm RV S prime:     15.20 cm/s TAPSE (M-mode): 2.9 cm LEFT ATRIUM              Index       RIGHT ATRIUM           Index LA diam:        3.20 cm  1.77 cm/m  RA Area:     15.60 cm LA Vol (A2C):   125.0 ml 69.09 ml/m RA Volume:   41.40 ml  22.88 ml/m LA Vol (A4C):   102.0 ml 56.38 ml/m LA Biplane Vol: 116.0 ml 64.12 ml/m  AORTIC VALVE AV Area (Vmax):    1.77 cm AV Area (Vmean):   1.78 cm AV Area (VTI):     1.83 cm AV Vmax:            181.00 cm/s AV Vmean:          116.000 cm/s AV VTI:            0.362 m AV Peak Grad:      13.1 mmHg AV Mean Grad:      7.0 mmHg LVOT Vmax:         113.00 cm/s LVOT Vmean:        72.700 cm/s LVOT VTI:          0.234 m LVOT/AV VTI ratio: 0.65  AORTA Ao Root diam: 3.00 cm Ao Asc diam:  2.20 cm MITRAL VALVE               TRICUSPID VALVE MV Area (PHT): 1.61 cm    TR Peak grad:   21.2 mmHg MV Area VTI:   3.15 cm    TR Vmax:        230.00 cm/s MV Peak grad:  4.0 mmHg MV Mean grad:  1.0 mmHg    SHUNTS MV Vmax:       1.00 m/s  Systemic VTI:  0.23 m MV Vmean:      46.7 cm/s   Systemic Diam: 1.90 cm MV Decel Time: 470 msec MV E velocity: 0.48 cm/s MV A velocity: 81.80 cm/s MV E/A ratio:  0.01 Laurance Flatten MD Electronically signed by Laurance Flatten MD Signature Date/Time: 03/25/2021/12:11:47 PM    Final      Joycelyn Das, MD  [facility] 03/26/2021  If 7PM-7AM, please contact night-coverage

## 2021-03-26 NOTE — Plan of Care (Signed)

## 2021-03-27 DIAGNOSIS — I509 Heart failure, unspecified: Secondary | ICD-10-CM | POA: Diagnosis not present

## 2021-03-27 DIAGNOSIS — I10 Essential (primary) hypertension: Secondary | ICD-10-CM | POA: Diagnosis not present

## 2021-03-27 DIAGNOSIS — E890 Postprocedural hypothyroidism: Secondary | ICD-10-CM | POA: Diagnosis not present

## 2021-03-27 DIAGNOSIS — N1831 Chronic kidney disease, stage 3a: Secondary | ICD-10-CM | POA: Diagnosis not present

## 2021-03-27 LAB — BASIC METABOLIC PANEL
Anion gap: 6 (ref 5–15)
BUN: 33 mg/dL — ABNORMAL HIGH (ref 8–23)
CO2: 27 mmol/L (ref 22–32)
Calcium: 8.8 mg/dL — ABNORMAL LOW (ref 8.9–10.3)
Chloride: 100 mmol/L (ref 98–111)
Creatinine, Ser: 1.55 mg/dL — ABNORMAL HIGH (ref 0.44–1.00)
GFR, Estimated: 32 mL/min — ABNORMAL LOW (ref >60)
Glucose, Bld: 94 mg/dL (ref 70–99)
Potassium: 3.6 mmol/L (ref 3.5–5.1)
Sodium: 133 mmol/L — ABNORMAL LOW (ref 135–145)

## 2021-03-27 LAB — CBC
HCT: 25.2 % — ABNORMAL LOW (ref 36.0–46.0)
Hemoglobin: 8.5 g/dL — ABNORMAL LOW (ref 12.0–15.0)
MCH: 20.8 pg — ABNORMAL LOW (ref 26.0–34.0)
MCHC: 33.7 g/dL (ref 30.0–36.0)
MCV: 61.8 fL — ABNORMAL LOW (ref 80.0–100.0)
Platelets: 238 10*3/uL (ref 150–400)
RBC: 4.08 MIL/uL (ref 3.87–5.11)
RDW: 18.9 % — ABNORMAL HIGH (ref 11.5–15.5)
WBC: 6.2 10*3/uL (ref 4.0–10.5)
nRBC: 0 % (ref 0.0–0.2)

## 2021-03-27 LAB — MAGNESIUM: Magnesium: 2.1 mg/dL (ref 1.7–2.4)

## 2021-03-27 NOTE — Progress Notes (Addendum)
PROGRESS NOTE  Linda Lucero ZOX:[id number] DOB: 10-Jul-1928 DOA: 03/24/2021 PCP: Wynn Banker, MD   LOS: 2 days   Brief narrative:  Linda Lucero is a 87 y.o. female with medical history significant for coronary artery disease, hypertension, hypothyroidism, and chronic kidney disease stage IIIa, who presented to the emergency department shortness of breath and some leg swelling for a month.  She was so short of breath for the last 1 day that she could not breathe and had some palpitations.  She has been having some dry cough for some duration now.  In the ED, patient was hypertensive.  EKG showed some sinus rhythm with PVC.  Chest x-ray was notable for cardiomegaly and vascular congestion.  Creatinine was 1.0.  High-sensitivity troponin was borderline elevated at 28 and 30.  BNP elevated at 438.  Patient was then admitted to hospital for congestive heart failure.  Assessment/Plan:  Principal Problem:   Acute CHF (HCC) Active Problems:   Hypertension   Chronic kidney disease, stage 3a (HCC)   Hypothyroidism   Coronary artery disease   CHF (congestive heart failure) (HCC)  Acute  diastolic CHF   Elevated BNP.  2D echocardiogram was performed which showed LV ejection fraction of 55 to 60% with diastolic dysfunction.  On IV Lasix twice a day and lisinopril.  Will hold Lasix and lisinopril.  Monitor renal function.  Negative balance for 3411 mL and has lost around 7-8 pounds.  Accelerated HTN   - BP as high as 230 systolic in ED, improved at this time.  Currently off lisinopril for elevated creatinine.  Off Nitropaste Nitropaste.  Continue hydralazine, clonidine   Mild hypokalemia.  Improved.  Continue replacement.  CKD IIIa - SCr is 1.10 on admission, elevated to 1.5 .  We will continue to monitor closely.  Off lisinopril and IV Lasix  Elevated troponin  Mildly elevated but flat without any chest pain or EKG changes.   Likely secondary to decompensated heart failure.  2D  echo noted no regional wall motion abnormality  DVT prophylaxis: heparin injection 5,000 Units Start: 03/25/21 1400   Code Status: DNR  Family Communication:  I spoke with the patient's son and updated her about the clinical condition of the patient.   Status is: Inpatient  The patient will require care spanning > 2 midnights and should be moved to inpatient because: IV treatments appropriate due to intensity of illness or inability to take PO, Inpatient level of care appropriate due to severity of illness and IV diuresis  Dispo: The patient is from: Home              Anticipated d/c is to: Home likely tomorrow.  Will get PT evaluation.  Monitor creatinine               Patient currently is not medically stable to d/c.   Difficult to place patient No   Consultants:  None  Procedures:  None  Anti-infectives:  . None  Anti-infectives (From admission, onward)   None     Subjective:  Today, patient was seen and examined with bedside sitter.  Feels washed out and very weak.  Feels nauseated with  chills  Objective: Vitals:   03/27/21 0300 03/27/21 0937  BP: (!) 111/53 (!) 151/53  Pulse: (!) 48   Resp: 16   Temp: 98.5 F (36.9 C)   SpO2: 97%     Intake/Output Summary (Last 24 hours) at 03/27/2021 1100 Last data filed at 03/27/2021 1003 Gross  per 24 hour  Intake 723 ml  Output 600 ml  Net 123 ml   Filed Weights   03/25/21 1804 03/26/21 0300 03/27/21 0615  Weight: 64.9 kg 66.9 kg 67.8 kg   Body mass index is 24.13 kg/m.   Physical Exam: General:  Average built, not in obvious distress HENT:   No scleral pallor or icterus noted. Oral mucosa is moist.  Chest:  Clear breath sounds.  Diminished breath sounds bilaterally. No crackles or wheezes.  CVS: S1 &S2 heard. No murmur.  Regular rate and rhythm. Abdomen: Soft, nontender, nondistended.  Bowel sounds are heard.   Extremities: No cyanosis, clubbing, edema has improved, peripheral pulses are palpable. Psych:  Alert, awake and oriented, normal mood CNS:  No cranial nerve deficits.  Power equal in all extremities.   Skin: Warm and dry.  No rashes noted.  Data Review: I have personally reviewed the following laboratory data and studies,  CBC: Recent Labs  Lab 03/24/21 1550 03/25/21 0612 03/26/21 0014 03/27/21 0024  WBC 6.8 6.2 6.1 6.2  NEUTROABS 3.6  --   --   --   HGB 10.2* 9.5* 9.2* 8.5*  HCT 31.7* 28.4* 27.0* 25.2*  MCV 63.8* 63.1* 61.9* 61.8*  PLT 260 255 236 238   Basic Metabolic Panel: Recent Labs  Lab 03/24/21 1550 03/25/21 0308 03/26/21 0014 03/27/21 0024  NA 140 140 138 133*  K 3.9 3.4* 3.6 3.6  CL 109 108 103 100  CO2 24 25 27 27   GLUCOSE 97 116* 97 94  BUN 14 13 23  33*  CREATININE 1.10* 1.15* 1.58* 1.55*  CALCIUM 9.5 9.0 9.2 8.8*  MG  --   --  2.1 2.1  PHOS  --   --  4.4  --    Liver Function Tests: Recent Labs  Lab 03/24/21 1550  AST 20  ALT 11  ALKPHOS 61  BILITOT 0.7  PROT 7.5  ALBUMIN 3.6   No results for input(s): LIPASE, AMYLASE in the last 168 hours. No results for input(s): AMMONIA in the last 168 hours. Cardiac Enzymes: No results for input(s): CKTOTAL, CKMB, CKMBINDEX, TROPONINI in the last 168 hours. BNP (last 3 results) Recent Labs    03/24/21 1947  BNP 438.2*    ProBNP (last 3 results) No results for input(s): PROBNP in the last 8760 hours.  CBG: No results for input(s): GLUCAP in the last 168 hours. Recent Results (from the past 240 hour(s))  SARS CORONAVIRUS 2 (TAT 6-24 HRS) Nasopharyngeal Nasopharyngeal Swab     Status: None   Collection Time: 03/24/21  8:14 PM   Specimen: Nasopharyngeal Swab  Result Value Ref Range Status   SARS Coronavirus 2 NEGATIVE NEGATIVE Final    Comment: (NOTE) SARS-CoV-2 target nucleic acids are NOT DETECTED.  The SARS-CoV-2 RNA is generally detectable in upper and lower respiratory specimens during the acute phase of infection. Negative results do not preclude SARS-CoV-2 infection, do not rule  out co-infections with other pathogens, and should not be used as the sole basis for treatment or other patient management decisions. Negative results must be combined with clinical observations, patient history, and epidemiological information. The expected result is Negative.  Fact Sheet for Patients: 03/26/21  Fact Sheet for Healthcare Providers: 03/26/21  This test is not yet approved or cleared by the [email or web address] FDA and  has been authorized for detection and/or diagnosis of SARS-CoV-2 by FDA under an Emergency Use Authorization (EUA). This EUA will remain  in effect (meaning this test  can be used) for the duration of the COVID-19 declaration under Se ction 564(b)(1) of the Act, 21 U.S.C. section 360bbb-3(b)(1), unless the authorization is terminated or revoked sooner.  Performed at [facility], [address], [place], [place] [address]      Studies: No results found.   Joycelyn Das, MD  [facility] 03/27/2021  If 7PM-7AM, please contact night-coverage

## 2021-03-27 NOTE — Progress Notes (Signed)
Pt BP 131/47 (72) HR 50. Held Hydralazine. Notified Joycelyn Das, MD. MD responded "okay to give hydralazine at next scheduled dose".

## 2021-03-28 DIAGNOSIS — E890 Postprocedural hypothyroidism: Secondary | ICD-10-CM | POA: Diagnosis not present

## 2021-03-28 DIAGNOSIS — E440 Moderate protein-calorie malnutrition: Secondary | ICD-10-CM | POA: Diagnosis present

## 2021-03-28 DIAGNOSIS — I10 Essential (primary) hypertension: Secondary | ICD-10-CM | POA: Diagnosis not present

## 2021-03-28 DIAGNOSIS — N1831 Chronic kidney disease, stage 3a: Secondary | ICD-10-CM | POA: Diagnosis not present

## 2021-03-28 DIAGNOSIS — I509 Heart failure, unspecified: Secondary | ICD-10-CM | POA: Diagnosis not present

## 2021-03-28 LAB — BASIC METABOLIC PANEL
Anion gap: 9 (ref 5–15)
BUN: 35 mg/dL — ABNORMAL HIGH (ref 8–23)
CO2: 23 mmol/L (ref 22–32)
Calcium: 8.7 mg/dL — ABNORMAL LOW (ref 8.9–10.3)
Chloride: 102 mmol/L (ref 98–111)
Creatinine, Ser: 1.88 mg/dL — ABNORMAL HIGH (ref 0.44–1.00)
GFR, Estimated: 25 mL/min — ABNORMAL LOW (ref >60)
Glucose, Bld: 92 mg/dL (ref 70–99)
Potassium: 4.2 mmol/L (ref 3.5–5.1)
Sodium: 134 mmol/L — ABNORMAL LOW (ref 135–145)

## 2021-03-28 LAB — CBC
HCT: 25.7 % — ABNORMAL LOW (ref 36.0–46.0)
Hemoglobin: 8.4 g/dL — ABNORMAL LOW (ref 12.0–15.0)
MCH: 20.5 pg — ABNORMAL LOW (ref 26.0–34.0)
MCHC: 32.7 g/dL (ref 30.0–36.0)
MCV: 62.8 fL — ABNORMAL LOW (ref 80.0–100.0)
Platelets: 233 10*3/uL (ref 150–400)
RBC: 4.09 MIL/uL (ref 3.87–5.11)
RDW: 18.9 % — ABNORMAL HIGH (ref 11.5–15.5)
WBC: 6.8 10*3/uL (ref 4.0–10.5)
nRBC: 0 % (ref 0.0–0.2)

## 2021-03-28 LAB — MAGNESIUM: Magnesium: 2.2 mg/dL (ref 1.7–2.4)

## 2021-03-28 MED ORDER — MAGNESIUM HYDROXIDE 400 MG/5ML PO SUSP
30.0000 mL | Freq: Every day | ORAL | Status: DC | PRN
  Administered 2021-03-28: 30 mL via ORAL
  Filled 2021-03-28: qty 30

## 2021-03-28 MED ORDER — SODIUM CHLORIDE 0.9 % IV SOLN: INTRAVENOUS | Status: AC

## 2021-03-28 MED ORDER — ENSURE ENLIVE PO LIQD
237.0000 mL | Freq: Two times a day (BID) | ORAL | Status: DC
  Administered 2021-03-28 – 2021-03-29 (×2): 237 mL via ORAL

## 2021-03-28 NOTE — Plan of Care (Signed)

## 2021-03-28 NOTE — Evaluation (Signed)
Physical Therapy Evaluation Patient Details Name: Linda Lucero MRN: [id number] DOB: 06-03-1928 Today's Date: 03/28/2021   History of Present Illness  87 yo admitted 5/20 with SOB, edema and acute CHF. PMHx: CAD, HTN, CKD, hypothyroidism  Clinical Impression  Pt pleasant and reports living alone in 2 story house and going upstairs to sleep each night then spending the day downstairs. Pt reports no falls or difficulty at home despite present balance deficits noted and decreased activity tolerance. Pt educated for need for AD with gait and progression of balance prior to D/C. Pt with decreased safety, gait and balance who will benefit from acute therapy to maximize independence and safety.  HR 62 SpO2 98% on RA    Follow Up Recommendations Home health PT    Equipment Recommendations  Rolling walker with 5" wheels (potentially)    Recommendations for Other Services       Precautions / Restrictions Precautions Precautions: Fall      Mobility  Bed Mobility Overal bed mobility: Modified Independent             General bed mobility comments: HOB 25 degrees with increased time    Transfers Overall transfer level: Modified independent                  Ambulation/Gait Ambulation/Gait assistance: Min assist Gait Distance (Feet): 200 Feet Assistive device: None Gait Pattern/deviations: Step-through pattern;Decreased stride length;Trunk flexed   Gait velocity interpretation: 1.31 - 2.62 ft/sec, indicative of limited community ambulator General Gait Details: pt with flexed shoulders and at least 4 periods of sway with min assist to recover gait. Pt resistant to use of Rw and reports she does have a cane at home she will use with gait and educated for fall risk currently with gait.  Stairs            Wheelchair Mobility    Modified Rankin (Stroke Patients Only)       Balance Overall balance assessment: Needs assistance Sitting-balance support: No upper  extremity supported;Feet supported Sitting balance-Leahy Scale: Good     Standing balance support: No upper extremity supported Standing balance-Leahy Scale: Poor Standing balance comment: LOB with gait with min assist to correct without UE support                             Pertinent Vitals/Pain Pain Assessment: No/denies pain    Home Living Family/patient expects to be discharged to:: Private residence Living Arrangements: Alone Available Help at Discharge: Family;Available 24 hours/day Type of Home: House Home Access: Stairs to enter   [employer] of Steps: 2 Home Layout: Two level;Bed/bath upstairs Home Equipment: Cane - single point      Prior Function Level of Independence: Independent         Comments: has assist for house cleaning     Hand Dominance        Extremity/Trunk Assessment   Upper Extremity Assessment Upper Extremity Assessment: Generalized weakness    Lower Extremity Assessment Lower Extremity Assessment: Generalized weakness    Cervical / Trunk Assessment Cervical / Trunk Assessment: Normal  Communication   Communication: No difficulties  Cognition Arousal/Alertness: Awake/alert Behavior During Therapy: WFL for tasks assessed/performed Overall Cognitive Status: Within Functional Limits for tasks assessed  General Comments      Exercises General Exercises - Lower Extremity Long Arc Quad: AROM;Both;10 reps;Seated Hip Flexion/Marching: AROM;Both;10 reps;Seated   Assessment/Plan    PT Assessment Patient needs continued PT services  PT Problem List Decreased activity tolerance;Decreased balance;Decreased knowledge of use of DME;Decreased mobility;Decreased safety awareness       PT Treatment Interventions Gait training;Stair training;Functional mobility training;Therapeutic activities;Patient/family education;Therapeutic exercise;Balance training;DME  instruction    PT Goals (Current goals can be found in the Care Plan section)  Acute Rehab PT Goals Patient Stated Goal: return home to crossword puzzles PT Goal Formulation: With patient Time For Goal Achievement: 04/11/21 Potential to Achieve Goals: Good    Frequency Min 3X/week   Barriers to discharge Decreased caregiver support      Co-evaluation               AM-PAC PT "6 Clicks" Mobility  Outcome Measure Help needed turning from your back to your side while in a flat bed without using bedrails?: None Help needed moving from lying on your back to sitting on the side of a flat bed without using bedrails?: None Help needed moving to and from a bed to a chair (including a wheelchair)?: A Little Help needed standing up from a chair using your arms (e.g., wheelchair or bedside chair)?: A Little Help needed to walk in hospital room?: A Little Help needed climbing 3-5 steps with a railing? : A Little 6 Click Score: 20    End of Session   Activity Tolerance: Patient tolerated treatment well Patient left: in chair;with call bell/phone within reach Nurse Communication: Mobility status PT Visit Diagnosis: Other abnormalities of gait and mobility (R26.89);Difficulty in walking, not elsewhere classified (R26.2)    Time: 1610-9604 PT Time Calculation (min) (ACUTE ONLY): 24 min   Charges:   PT Evaluation $PT Eval Moderate Complexity: 1 Mod PT Treatments $Gait Training: 8-22 mins        Aniruddh Ciavarella P, PT Acute Rehabilitation Services Pager: [phone] Office: [phone]   Enedina Finner Conchita Truxillo 03/28/2021, 12:47 PM

## 2021-03-28 NOTE — Progress Notes (Signed)
Initial Nutrition Assessment  DOCUMENTATION CODES:   Non-severe (moderate) malnutrition in context of chronic illness  INTERVENTION:   - Ensure Enlive po BID, each supplement provides 350 kcal and 20 grams of protein  - Diet liberalized to 2 gram sodium per discussion with MD  - Encourage adequate PO intake  - RD provided diet education and "Heart Failure Nutrition Therapy for the Undernourished" handout from the Academy of Nutrition and Dietetics  NUTRITION DIAGNOSIS:   Moderate Malnutrition related to chronic illness (CHF) as evidenced by moderate fat depletion,moderate muscle depletion.  GOAL:   Patient will meet greater than or equal to 90% of their needs  MONITOR:   PO intake,Supplement acceptance,Labs,Weight trends,I & O's  REASON FOR ASSESSMENT:   Consult Poor PO,Diet education  ASSESSMENT:   87 year old female who presented to the ED on 5/19 with SOB. PMH of CAD, HTN, hypothyroidism, CKD stage IIIa. Pt admitted with acute on chronic diastolic CHF.   Spoke with RN prior to entering pt's room. RN reports pt with increased PO intake today and consumed 100% of breakfast meal.  Spoke with pt at bedside. Pt reports that her mouth is dry. RN providing pt with lip balm. Pt reports that yesterday she woke up with nausea but did not wake up with nausea today. She reports that she wasn't able to each much yesterday due to the nausea but did consume an Ensure supplement.  Pt reports that PTA she was eating fairly. She reports that she noticed a decrease in her appetite about 6 months ago. Pt typically eats 2-3 meals daily. For breakfast, pt consumes raisin brain cereal with whole milk. For lunch and/or dinner, pt consumes a bag of mixed vegetables with butter and salt and pepper.  Pt reports that she began losing weight about 1 year ago. Pt states that she used to weigh 180 lbs and now weighs 150 lbs. Reviewed weight history in chart. Pt with a 6.6 kg weight loss since  10/27/20. This is a 9% weight loss in 6 moths which is not quite significant for timeframe. Also, it is difficult to determine how much of pt's weight loss can be attributed to fluid loss given pt has been diuresing this admission.  RD provided "Heart Failure Nutrition Therapy for the Undernourished" handout from the Academy of Nutrition and Dietetics. Reviewed patient's dietary recall. Provided examples on ways to decrease sodium intake in diet while increasing calorie and protein intake. Discouraged intake of processed foods and use of salt shaker. Encouraged fresh fruits and vegetables as well as whole grain sources of carbohydrates to maximize fiber intake.  RD discussed why it is important for patient to adhere to diet recommendations, and emphasized the role of fluids, foods to avoid, and importance of weighing self daily. Teach back method used.  Pt meets criteria for malnutrition based on NFPE. Pt willing to consume Ensure supplements during admission. Encouraged pt to purchase Ensure or Boost to consume at home to aid in increasing calorie and protein intake. Pt agreeable. RD discussed diet liberalization with MD who agreed with 2 gram sodium diet.  Meal Completion: 0-100%  Medications reviewed and include: NS @ 100 ml/hr  Labs reviewed: sodium 134, BUN 35, creatinine 1.88, hemoglobin 8.4  UOP: 1250 ml x 24 hours I/O's: -3.8 L since admit  NUTRITION - FOCUSED PHYSICAL EXAM:  Flowsheet Row Most Recent Value  Orbital Region Moderate depletion  Upper Arm Region Moderate depletion  Thoracic and Lumbar Region Moderate depletion  Buccal Region Mild depletion  Temple Region Mild depletion  Clavicle Bone Region Moderate depletion  Clavicle and Acromion Bone Region Moderate depletion  Scapular Bone Region Moderate depletion  Dorsal Hand Mild depletion  Patellar Region Mild depletion  Anterior Thigh Region Moderate depletion  Posterior Calf Region Moderate depletion  Edema (RD  Assessment) None  Hair Reviewed  Eyes Reviewed  Mouth Reviewed  Skin Reviewed  Nails Reviewed       Diet Order:   Diet Order            Diet 2 gram sodium Room service appropriate? Yes; Fluid consistency: Thin  Diet effective now                 EDUCATION NEEDS:   Education needs have been addressed  Skin:  Skin Assessment: Reviewed RN Assessment  Last BM:  03/23/21  Height:   Ht Readings from Last 1 Encounters:  03/25/21 5\' 6"  (1.676 m)    Weight:   Wt Readings from Last 1 Encounters:  03/28/21 66 kg    BMI:  Body mass index is 23.48 kg/m.  Estimated Nutritional Needs:   Kcal:  1500-1700  Protein:  70-85 grams  Fluid:  1.5-1.7 L    03/30/21, MS, RD, LDN Inpatient Clinical Dietitian Please see AMiON for contact information.

## 2021-03-28 NOTE — Progress Notes (Signed)
PROGRESS NOTE  Linda Lucero KDX:[id number] DOB: 1928/09/12 DOA: 03/24/2021 PCP: Wynn Banker, MD   LOS: 3 days   Brief narrative:  Linda Lucero is a 87 y.o. female with medical history significant for coronary artery disease, hypertension, hypothyroidism, and chronic kidney disease stage IIIa, who presented to the emergency department shortness of breath and some leg swelling for a month.  She was so short of breath for the last 1 day that she could not breathe and had some palpitations.  She has been having some dry cough for some duration now.  In the ED, patient was hypertensive.  EKG showed some sinus rhythm with PVC.  Chest x-ray was notable for cardiomegaly and vascular congestion.  Creatinine was 1.0.  High-sensitivity troponin was borderline elevated at 28 and 30.  BNP elevated at 438.  Patient was then admitted to hospital for congestive heart failure.  Assessment/Plan:  Principal Problem:   Acute CHF (HCC) Active Problems:   Hypertension   Chronic kidney disease, stage 3a (HCC)   Hypothyroidism   Coronary artery disease   CHF (congestive heart failure) (HCC)  Acute  diastolic CHF   Elevated BNP.  2D echocardiogram was performed which showed LV ejection fraction of 55 to 60% with diastolic dysfunction.  Was initially on IV Lasix and lisinopril.  Currently on hold due to increasing creatinine levels.  Monitor renal function.  Negative balance for 97673 mL and has lost around 13 pounds.  Accelerated HTN   - BP as high as 230 systolic in ED, improved at this time.  Currently off lisinopril for elevated creatinine.  Off Nitropaste Nitropaste.  Continue hydralazine, clonidine .  Latest blood pressure 120/ 41.  Mild hypokalemia.  Improved with replacement.  Continue while on diuretics  CKD IIIa - SCr is 1.10 on admission, elevated to 1.8 .  We will continue to monitor closely.  Off lisinopril and IV Lasix.  We will try gentle IV fluids for few hours today. Lab  Results  Component Value Date   CREATININE 1.88 (H) 03/28/2021   CREATININE 1.55 (H) 03/27/2021   CREATININE 1.58 (H) 03/26/2021    Elevated troponin  Mildly elevated but flat without any chest pain or EKG changes.   Likely secondary to decompensated heart failure.  2D echo noted no regional wall motion abnormality  DVT prophylaxis: heparin injection 5,000 Units Start: 03/25/21 1400   Code Status: DNR  Family Communication:  None today.  I spoke with the patient's son yesterday.  Status is: Inpatient  The patient is inpatient because: IV treatments appropriate due to intensity of illness or inability to take PO, Inpatient level of care appropriate due to severity of illness and IV diuresis  Dispo: The patient is from: Home              Anticipated d/c is to: Home with home health.              Patient currently is not medically stable to d/c.   Difficult to place patient No   Consultants:  None  Procedures:  None  Anti-infectives:  . None  Anti-infectives (From admission, onward)   None     Subjective:  Today, patient was seen and examined at bedside.  Is a little better today and does not feel too much washed out.  Has not had much urinary output but feels thirsty. Objective: Vitals:   03/28/21 1130 03/28/21 1201  BP: (!) 120/41   Pulse: (!) 48 (!) 57  Resp: 20   Temp: 98.3 F (36.8 C)   SpO2: 98% 95%    Intake/Output Summary (Last 24 hours) at 03/28/2021 1243 Last data filed at 03/28/2021 0717 Gross per 24 hour  Intake 777 ml  Output 1250 ml  Net -473 ml   Filed Weights   03/26/21 0300 03/27/21 0615 03/28/21 0300  Weight: 66.9 kg 67.8 kg 66 kg   Body mass index is 23.48 kg/m.   Physical Exam: General:  Average built, not in obvious distress HENT:   No scleral pallor or icterus noted. Oral mucosa is moist.  Chest:  Clear breath sounds.  Diminished breath sounds bilaterally. No crackles or wheezes.  CVS: S1 &S2 heard. No murmur.  Regular rate  and rhythm. Abdomen: Soft, nontender, nondistended.  Bowel sounds are heard.   Extremities: No cyanosis, clubbing, no edema., peripheral pulses are palpable. Psych: Alert, awake and oriented, normal mood CNS:  No cranial nerve deficits.  Power equal in all extremities.   Skin: Warm and dry.  Diminished skin turgor.  Data Review: I have personally reviewed the following laboratory data and studies,  CBC: Recent Labs  Lab 03/24/21 1550 03/25/21 0612 03/26/21 0014 03/27/21 0024 03/28/21 0039  WBC 6.8 6.2 6.1 6.2 6.8  NEUTROABS 3.6  --   --   --   --   HGB 10.2* 9.5* 9.2* 8.5* 8.4*  HCT 31.7* 28.4* 27.0* 25.2* 25.7*  MCV 63.8* 63.1* 61.9* 61.8* 62.8*  PLT 260 255 236 238 233   Basic Metabolic Panel: Recent Labs  Lab 03/24/21 1550 03/25/21 0308 03/26/21 0014 03/27/21 0024 03/28/21 0039  NA 140 140 138 133* 134*  K 3.9 3.4* 3.6 3.6 4.2  CL 109 108 103 100 102  CO2 24 25 27 27 23   GLUCOSE 97 116* 97 94 92  BUN 14 13 23  33* 35*  CREATININE 1.10* 1.15* 1.58* 1.55* 1.88*  CALCIUM 9.5 9.0 9.2 8.8* 8.7*  MG  --   --  2.1 2.1 2.2  PHOS  --   --  4.4  --   --    Liver Function Tests: Recent Labs  Lab 03/24/21 1550  AST 20  ALT 11  ALKPHOS 61  BILITOT 0.7  PROT 7.5  ALBUMIN 3.6   No results for input(s): LIPASE, AMYLASE in the last 168 hours. No results for input(s): AMMONIA in the last 168 hours. Cardiac Enzymes: No results for input(s): CKTOTAL, CKMB, CKMBINDEX, TROPONINI in the last 168 hours. BNP (last 3 results) Recent Labs    03/24/21 1947  BNP 438.2*    ProBNP (last 3 results) No results for input(s): PROBNP in the last 8760 hours.  CBG: No results for input(s): GLUCAP in the last 168 hours. Recent Results (from the past 240 hour(s))  SARS CORONAVIRUS 2 (TAT 6-24 HRS) Nasopharyngeal Nasopharyngeal Swab     Status: None   Collection Time: 03/24/21  8:14 PM   Specimen: Nasopharyngeal Swab  Result Value Ref Range Status   SARS Coronavirus 2 NEGATIVE  NEGATIVE Final    Comment: (NOTE) SARS-CoV-2 target nucleic acids are NOT DETECTED.  The SARS-CoV-2 RNA is generally detectable in upper and lower respiratory specimens during the acute phase of infection. Negative results do not preclude SARS-CoV-2 infection, do not rule out co-infections with other pathogens, and should not be used as the sole basis for treatment or other patient management decisions. Negative results must be combined with clinical observations, patient history, and epidemiological information. The expected result is Negative.  Fact Sheet for Patients: HairSlick.no  Fact Sheet for Healthcare Providers: quierodirigir.com  This test is not yet approved or cleared by the [place] FDA and  has been authorized for detection and/or diagnosis of SARS-CoV-2 by FDA under an Emergency Use Authorization (EUA). This EUA will remain  in effect (meaning this test can be used) for the duration of the COVID-19 declaration under Se ction 564(b)(1) of the Act, 21 U.S.C. section 360bbb-3(b)(1), unless the authorization is terminated or revoked sooner.  Performed at [facility], [address] [place], [place] [address]      Studies: No results found.   Joycelyn Das, MD  [facility] 03/28/2021  If 7PM-7AM, please contact night-coverage

## 2021-03-28 NOTE — TOC Transition Note (Signed)
Transition of Care [facility]) - CM/SW Discharge Note   Patient Details  Name: Linda Lucero MRN: [id number] Date of Birth: [birth year]  Transition of Care [facility]) CM/SW Contact:  Leone Haven, RN Phone Number: 03/28/2021, 4:15 PM   Clinical Narrative:    NCM spoke with patient, offered choice, she states she does not have a preference for the [facility] agency.  She also states it is ok for [employer] to supply rolling walker.  NCM made referral to [facility] with [facility] for HHPT, and made referral to [place] with [facility] for the walker.  Soc will begin 24 to 48 hrs post dc.  The walker will be brought up to the room prior to dc. Patient states she has a scale and a bp cuff and a cane.  She states she tries to consume a low sodium diet as well.  Her son will transport her home at dc.  Her son is over to her house every single day for half a day at least.    Final next level of care: Home w Home Health Services Barriers to Discharge: Continued Medical Work up   Patient Goals and CMS Choice Patient states their goals for this hospitalization and ongoing recovery are:: return home CMS Medicare.gov Compare Post Acute Care list provided to:: Patient Choice offered to / list presented to : Patient  Discharge Placement                       Discharge Plan and Services                DME Arranged: Walker rolling DME Agency: [facility] Date DME Agency Contacted: 03/28/21 Time DME Agency Contacted: [phone] Representative spoke with at DME Agency: Lawernce Keas HH Arranged: PT HH Agency: [facility] Date [facility] Agency Contacted: 03/28/21 Time HH Agency Contacted: 1615 Representative spoke with at [facility] Agency: Stacie  Social Determinants of Health (SDOH) Interventions     Readmission Risk Interventions No flowsheet data found.

## 2021-03-28 NOTE — Progress Notes (Signed)
Heart Failure Navigator Progress Note  Assessed for Heart & Vascular TOC clinic readiness.  Unfortunately at this time the patient does not meet criteria due to worsening renal function - CrCl <20 mL/min.   Navigator available for reassessment of patient.   Sharen Hones, PharmD, BCPS Heart Failure Stewardship Pharmacist Phone [phone]

## 2021-03-29 DIAGNOSIS — I509 Heart failure, unspecified: Secondary | ICD-10-CM | POA: Diagnosis not present

## 2021-03-29 DIAGNOSIS — N1831 Chronic kidney disease, stage 3a: Secondary | ICD-10-CM | POA: Diagnosis not present

## 2021-03-29 DIAGNOSIS — E440 Moderate protein-calorie malnutrition: Secondary | ICD-10-CM

## 2021-03-29 DIAGNOSIS — R531 Weakness: Secondary | ICD-10-CM | POA: Diagnosis not present

## 2021-03-29 DIAGNOSIS — E890 Postprocedural hypothyroidism: Secondary | ICD-10-CM | POA: Diagnosis not present

## 2021-03-29 DIAGNOSIS — I10 Essential (primary) hypertension: Secondary | ICD-10-CM | POA: Diagnosis not present

## 2021-03-29 LAB — BASIC METABOLIC PANEL
Anion gap: 5 (ref 5–15)
BUN: 32 mg/dL — ABNORMAL HIGH (ref 8–23)
CO2: 28 mmol/L (ref 22–32)
Calcium: 9.2 mg/dL (ref 8.9–10.3)
Chloride: 101 mmol/L (ref 98–111)
Creatinine, Ser: 1.49 mg/dL — ABNORMAL HIGH (ref 0.44–1.00)
GFR, Estimated: 33 mL/min — ABNORMAL LOW (ref >60)
Glucose, Bld: 99 mg/dL (ref 70–99)
Potassium: 4.3 mmol/L (ref 3.5–5.1)
Sodium: 134 mmol/L — ABNORMAL LOW (ref 135–145)

## 2021-03-29 MED ORDER — LISINOPRIL 10 MG PO TABS: 20.0000 mg | ORAL_TABLET | Freq: Every day | ORAL | Status: DC

## 2021-03-29 MED ORDER — FUROSEMIDE 40 MG PO TABS: 20.0000 mg | ORAL_TABLET | Freq: Every day | ORAL | 2 refills | Status: DC | PRN

## 2021-03-29 MED ORDER — ENSURE ENLIVE PO LIQD: 237.0000 mL | Freq: Two times a day (BID) | ORAL | 12 refills | Status: DC

## 2021-03-29 NOTE — Care Management Important Message (Signed)
Important Message  Patient Details  Name: Linda Lucero MRN: [id number] Date of Birth: June 24, 1928   Medicare Important Message Given:  Yes     Husain Costabile Stefan Church 03/29/2021, 3:55 PM

## 2021-03-29 NOTE — Progress Notes (Signed)
Physical Therapy Treatment Patient Details Name: Linda Lucero MRN: [id number] DOB: 1928/03/04 Today's Date: 03/29/2021    History of Present Illness 87 yo admitted 5/20 with SOB, edema and acute CHF. PMHx: CAD, HTN, CKD, hypothyroidism    PT Comments    Pt received in supine, agreeable to therapy session and with good participation in stair negotiation, standing balance tasks and HEP instruction. Pt performed short gait tasks in room with RW and Supervision, no LOB using assistive device and encouraged her to use it all the time once home unless HHPT weans her off of it. Pt able to stand at sink unsupported ~5 minutes without LOB during standing self-care and reaching activities. Pt given HEP handout for LE strengthening and encouraged her not to perform standing exercises unless second person present for safety. Pt continues to benefit from PT services to progress toward functional mobility goals. Continue to recommend HHPT.   Follow Up Recommendations  Home health PT     Equipment Recommendations  Rolling walker with 5" wheels    Recommendations for Other Services       Precautions / Restrictions Precautions Precautions: Fall Restrictions Weight Bearing Restrictions: No    Mobility  Bed Mobility Overal bed mobility: Modified Independent          General bed mobility comments: HOB 25 degrees with increased time, does not need to use rail    Transfers Overall transfer level: Needs assistance Equipment used: Rolling walker (2 wheeled) Transfers: Sit to/from Stand Sit to Stand: Supervision         General transfer comment: from EOB and toilet, cues for not pulling up on RW  Ambulation/Gait Ambulation/Gait assistance: Supervision Gait Distance (Feet): 40 Feet (79ft x2 to/from toilet) Assistive device: Rolling walker (2 wheeled) Gait Pattern/deviations: Step-through pattern;Decreased stride length;Trunk flexed Gait velocity: <0.3 m/s   General Gait Details:  encouraged use of RW and pt steadier using device, adjusted for proper fit and pt without LOB but needs cues for line awareness   Stairs Stairs: Yes Stairs assistance: Min guard;Supervision Stair Management: Step to pattern;Forwards;With walker Number of Stairs: 3 General stair comments: pt ascended/descended single 7" step x3 reps with cues for sequencing, no LOB and min guard to supervision for safety, pt reports no rails but has pillar she can hold on to and encouraged her to hold son's hand as well for safety   Wheelchair Mobility    Modified Rankin (Stroke Patients Only)       Balance Overall balance assessment: Needs assistance Sitting-balance support: No upper extremity supported;Feet supported Sitting balance-Leahy Scale: Good     Standing balance support: No upper extremity supported Standing balance-Leahy Scale: Poor Standing balance comment: static standing at sink no LOB, good use of RW with mobility within room and no LOB (pt encouraged to use it)          Cognition Arousal/Alertness: Awake/alert Behavior During Therapy: WFL for tasks assessed/performed Overall Cognitive Status: Within Functional Limits for tasks assessed            General Comments: good following of 1-step commands, needs some repetition of 2-step safety commands      Exercises General Exercises - Lower Extremity Long Arc Quad: AROM;Both;10 reps;Seated Hip Flexion/Marching: AROM;Both;10 reps;Seated Other Exercises Other Exercises: pt given seated/standing HEP and strict instructions only to perform standing exercises if son or another person present in room for guarding while she performs, even if with RW. Also wrote this on HEP sheet. Pt acknowledges understanding.  General Comments        Pertinent Vitals/Pain Pain Assessment: No/denies pain           PT Goals (current goals can now be found in the care plan section) Acute Rehab PT Goals Patient Stated Goal: return home  to crossword puzzles PT Goal Formulation: With patient Time For Goal Achievement: 04/11/21 Potential to Achieve Goals: Good Progress towards PT goals: Progressing toward goals    Frequency    Min 3X/week      PT Plan Current plan remains appropriate       AM-PAC PT "6 Clicks" Mobility   Outcome Measure  Help needed turning from your back to your side while in a flat bed without using bedrails?: None Help needed moving from lying on your back to sitting on the side of a flat bed without using bedrails?: None Help needed moving to and from a bed to a chair (including a wheelchair)?: A Little Help needed standing up from a chair using your arms (e.g., wheelchair or bedside chair)?: A Little Help needed to walk in hospital room?: A Little Help needed climbing 3-5 steps with a railing? : A Little 6 Click Score: 20    End of Session Equipment Utilized During Treatment: Gait belt Activity Tolerance: Patient tolerated treatment well Patient left: in chair;with call bell/phone within reach;Other (comment) (pt able to demonstrate use of call bell, purewick not in place as she can walk to bathroom and pad/mesh briefs in place) Nurse Communication: Mobility status;Other (comment) (needs chair alarm but able to use call bell safely) PT Visit Diagnosis: Other abnormalities of gait and mobility (R26.89);Difficulty in walking, not elsewhere classified (R26.2)     Time: 2952-8413 PT Time Calculation (min) (ACUTE ONLY): 27 min  Charges:  $Gait Training: 8-22 mins $Therapeutic Activity: 8-22 mins                     Winter Jocelyn P., PTA Acute Rehabilitation Services Pager: [phone] Office: [phone]   [place]us [place]ms 03/29/2021, 12:21 PM

## 2021-03-29 NOTE — TOC Transition Note (Signed)
Transition of Care [facility]) - CM/SW Discharge Note   Patient Details  Name: Linda Lucero MRN: [id number] Date of Birth: 06-Feb-1928  [facility]) CM/SW Contact:  Glennon Mac, RN Phone Number: 03/29/2021, 1:29 PM   Clinical Narrative:   Pt medically stable for discharge home today with familyl to provide 24h care at discharge.  RW delivered to room from [facility], as ordered, but pt requesting rolling walker with seat.  Notified [employer], who will change this out for her.  She is also requesting a HHA, and bedside nurse to notify MD to add [facility] aide to orders.  Per Stacie at [facility], agency is able to add a HH aide to see patient at discharge.     Final next level of care: Home w Home Health Services Barriers to Discharge: Barriers Resolved   Patient Goals and CMS Choice Patient states their goals for this hospitalization and ongoing recovery are:: return home CMS Medicare.gov Compare Post Acute Care list provided to:: Patient Choice offered to / list presented to : Patient                         Discharge Plan and Services   Discharge Planning Services: CM Consult            DME Arranged: Walker rolling with seat DME Agency: [employer] Date DME Agency Contacted: 03/28/21 Time DME Agency Contacted: [phone] Representative spoke with at DME Agency: Lawernce Keas HH Arranged: PT,Nurse's Aide HH Agency: [facility] Date [facility] Agency Contacted: 03/28/21 Time HH Agency Contacted: [phone] Representative spoke with at [facility] Agency: Stacie  Social Determinants of Health (SDOH) Interventions     Readmission Risk Interventions No flowsheet data found.  Quintella Baton, RN, BSN  Trauma/Neuro ICU Case Manager [phone]

## 2021-03-29 NOTE — Plan of Care (Signed)

## 2021-03-29 NOTE — Discharge Summary (Signed)
Physician Discharge Summary  Linda Lucero WNU:[id number] DOB: 10-20-1928 DOA: 03/24/2021  PCP: Wynn BankerKoberlein, Junell C, MD  Admit date: 03/24/2021 Discharge date: 03/29/2021  Admitted From: Home  Discharge disposition: Home with home health  Recommendations for Outpatient Follow-Up:   . Follow up with your primary care provider in one week.  . Check CBC, BMP, magnesium in the next visit  Discharge Diagnosis:   Principal Problem:   Acute CHF (HCC) Active Problems:   Hypertension   Chronic kidney disease, stage 3a (HCC)   Hypothyroidism   Coronary artery disease   CHF (congestive heart failure) (HCC)   Malnutrition of moderate degree   Discharge Condition: Improved.  Diet recommendation: Low sodium, heart healthy.  Carbohydrate-modified.  Regular.  Wound care: None.  Code status: Full.   History of Present Illness:   Linda RayDorothy M Lucero a 87 y.o.femalewith medical history significant forcoronary artery disease, hypertension, hypothyroidism, and chronic kidney disease stage IIIa, who presented to the emergency department shortness of breath and some leg swelling for a month.  She was so short of breath for the last 1 day that she could not breathe and had some palpitations.  She has been having some dry cough for some duration now.  In the ED, patient was hypertensive.  EKG showed some sinus rhythm with PVC.  Chest x-Lucero was notable for cardiomegaly and vascular congestion.  Creatinine was 1.0.  High-sensitivity troponin was borderline elevated at 28 and 30.  BNP elevated at 438.  Patient was then admitted to hospital for congestive heart failure.   Hospital Course:   Following conditions were addressed during hospitalization as listed below,  Acute  diastolic CHF  Significantly improved at this time.    2D echocardiogram was performed which showed LV ejection fraction of 55 to 60% with diastolic dysfunction.  Patient will be given as needed Lasix and continued on ACE  inhibitor's on discharge.  She was advised to follow-up with her primary care physician as outpatient and check blood work at that time.  Accelerated HTN Proved during hospitalization.  Patient will be continued on lisinopril, clonidine, amlodipine and hydralazine.    Mild hypokalemia.  Improved with replacement.  Potassium prior to discharge was 4.3.    CKD IIIa -SCr is 1.10 on admission, elevated to 1.8 .    Received gentle IV fluids.  Creatinine prior to discharge was 1.4.  Elevated troponin Mildly elevated but flat without any chest pain or EKG changes.   Likely secondary to decompensated heart failure.  2D echo noted no regional wall motion abnormality  Disposition.  At this time, patient is stable for disposition home with outpatient PCP follow-up.  Medical Consultants:    None.  Procedures:    None Subjective:   Today, patient was seen and examined at bedside.  Feels better.  Denies any shortness of breath, cough dizziness lightheadedness.  Discharge Exam:   Vitals:   03/29/21 0559 03/29/21 0801  BP: (!) 135/45 (!) 136/41  Pulse:  61  Resp:  18  Temp:  97.7 F (36.5 C)  SpO2:  98%   Vitals:   03/28/21 2300 03/29/21 0300 03/29/21 0559 03/29/21 0801  BP: (!) 109/41 (!) 141/68 (!) 135/45 (!) 136/41  Pulse: (!) 48 (!) 54  61  Resp: 18 14  18   Temp: 98.1 F (36.7 C) 98.1 F (36.7 C)  97.7 F (36.5 C)  TempSrc: Oral Oral  Oral  SpO2: 96% 98%  98%  Weight:  66 kg  Height:        General: Alert awake, not in obvious distress HENT: pupils equally reacting to light,  No scleral pallor or icterus noted. Oral mucosa is moist.  Chest:  Clear breath sounds.  Diminished breath sounds bilaterally. No crackles or wheezes.  CVS: S1 &S2 heard. No murmur.  Regular rate and rhythm. Abdomen: Soft, nontender, nondistended.  Bowel sounds are heard.   Extremities: No cyanosis, clubbing or edema.  Peripheral pulses are palpable. Psych: Alert, awake and oriented,  normal mood CNS:  No cranial nerve deficits.  Power equal in all extremities.   Skin: Warm and dry.  No rashes noted.  The results of significant diagnostics from this hospitalization (including imaging, microbiology, ancillary and laboratory) are listed below for reference.     Diagnostic Studies:   DG Chest 1 View  Result Date: 03/24/2021 CLINICAL DATA:  Shortness of breath EXAM: CHEST  1 VIEW COMPARISON:  08/22/2017 FINDINGS: Mild cardiomegaly with vascular congestion. Aortic atherosclerosis. No consolidation or significant effusion. No pneumothorax. IMPRESSION: No active disease.  Cardiomegaly with vascular congestion Electronically Signed   By: Jasmine Pang M.D.   On: 03/24/2021 16:27   ECHOCARDIOGRAM COMPLETE  Result Date: 03/25/2021    ECHOCARDIOGRAM REPORT   Patient Name:   Linda Lucero Date of Exam: 03/25/2021 Medical Rec #:  [id number]       Height:       66.0 in Accession #:    [id number]      Weight:       158.0 lb Date of Birth:  11-Dec-1927      BSA:          1.809 m Patient Age:    87 years        BP:           164/63 mmHg Patient Gender: F               HR:           59 bpm. Exam Location:  Inpatient Procedure: 2D Echo, Color Doppler and Cardiac Doppler Indications:    CHF-Acute Diastolic  History:        Patient has prior history of Echocardiogram examinations, most                 recent 10/24/2005. CHF; Risk Factors:Hypertension. CKD. Elevated                 troponin.  Sonographer:    Ross Ludwig RDCS (AE) Referring Phys: [id number] TIMOTHY S OPYD  Sonographer Comments: Suboptimal subcostal window. IMPRESSIONS  1. Left ventricular ejection fraction, by estimation, is 55 to 60%. The left ventricle has normal function. The left ventricle has no regional wall motion abnormalities. There is severe concentric left ventricular hypertrophy. Left ventricular diastolic  parameters are consistent with Grade I diastolic dysfunction (impaired relaxation).  2. Right ventricular systolic function  is normal. The right ventricular size is normal.  3. Left atrial size was severely dilated.  4. Right atrial size was mildly dilated.  5. The mitral valve is normal in structure. Trivial mitral valve regurgitation.  6. The aortic valve is tricuspid. There is mild calcification of the aortic valve. There is mild thickening of the aortic valve. Aortic valve regurgitation is not visualized. Mild to moderate aortic valve sclerosis/calcification is present, without any evidence of aortic stenosis. Comparison(s): Compared to prior echo report in 2006, there is now severe LVH with moderate aortic sclerosis without significant stenosis. FINDINGS  Left Ventricle:  Left ventricular ejection fraction, by estimation, is 55 to 60%. The left ventricle has normal function. The left ventricle has no regional wall motion abnormalities. The left ventricular internal cavity size was normal in size. There is  severe concentric left ventricular hypertrophy. Left ventricular diastolic parameters are consistent with Grade I diastolic dysfunction (impaired relaxation). Right Ventricle: The right ventricular size is normal. No increase in right ventricular wall thickness. Right ventricular systolic function is normal. Left Atrium: Left atrial size was severely dilated. Right Atrium: Right atrial size was mildly dilated. Pericardium: There is no evidence of pericardial effusion. Mitral Valve: The mitral valve is normal in structure. There is mild thickening of the mitral valve leaflet(s). There is mild calcification of the mitral valve leaflet(s). Mild mitral annular calcification. Trivial mitral valve regurgitation. MV peak gradient, 4.0 mmHg. The mean mitral valve gradient is 1.0 mmHg. Tricuspid Valve: The tricuspid valve is normal in structure. Tricuspid valve regurgitation is trivial. Aortic Valve: The aortic valve is tricuspid. There is mild calcification of the aortic valve. There is mild thickening of the aortic valve. Aortic valve  regurgitation is not visualized. Mild to moderate aortic valve sclerosis/calcification is present, without any evidence of aortic stenosis. Aortic valve mean gradient measures 7.0 mmHg. Aortic valve peak gradient measures 13.1 mmHg. Aortic valve area, by VTI measures 1.83 cm. Pulmonic Valve: The pulmonic valve was normal in structure. Pulmonic valve regurgitation is trivial. Aorta: The aortic root is normal in size and structure. Venous: The inferior vena cava was not well visualized. IAS/Shunts: No atrial level shunt detected by color flow Doppler.  LEFT VENTRICLE PLAX 2D LVIDd:         3.90 cm  Diastology LVIDs:         2.80 cm  LV e' medial:    0.03 cm/s LV PW:         2.00 cm  LV E/e' medial:  14.3 LV IVS:        2.60 cm  LV e' lateral:   0.05 cm/s LVOT diam:     1.90 cm  LV E/e' lateral: 10.6 LV SV:         66 LV SV Index:   37 LVOT Area:     2.84 cm  RIGHT VENTRICLE RV Basal diam:  2.40 cm RV S prime:     15.20 cm/s TAPSE (M-mode): 2.9 cm LEFT ATRIUM              Index       RIGHT ATRIUM           Index LA diam:        3.20 cm  1.77 cm/m  RA Area:     15.60 cm LA Vol (A2C):   125.0 ml 69.09 ml/m RA Volume:   41.40 ml  22.88 ml/m LA Vol (A4C):   102.0 ml 56.38 ml/m LA Biplane Vol: 116.0 ml 64.12 ml/m  AORTIC VALVE AV Area (Vmax):    1.77 cm AV Area (Vmean):   1.78 cm AV Area (VTI):     1.83 cm AV Vmax:           181.00 cm/s AV Vmean:          116.000 cm/s AV VTI:            0.362 m AV Peak Grad:      13.1 mmHg AV Mean Grad:      7.0 mmHg LVOT Vmax:         113.00 cm/s  LVOT Vmean:        72.700 cm/s LVOT VTI:          0.234 m LVOT/AV VTI ratio: 0.65  AORTA Ao Root diam: 3.00 cm Ao Asc diam:  2.20 cm MITRAL VALVE               TRICUSPID VALVE MV Area (PHT): 1.61 cm    TR Peak grad:   21.2 mmHg MV Area VTI:   3.15 cm    TR Vmax:        230.00 cm/s MV Peak grad:  4.0 mmHg MV Mean grad:  1.0 mmHg    SHUNTS MV Vmax:       1.00 m/s    Systemic VTI:  0.23 m MV Vmean:      46.7 cm/s   Systemic Diam: 1.90 cm  MV Decel Time: 470 msec MV E velocity: 0.48 cm/s MV A velocity: 81.80 cm/s MV E/A ratio:  0.01 Laurance Flatten MD Electronically signed by Laurance Flatten MD Signature Date/Time: 03/25/2021/12:11:47 PM    Final      Labs:   Basic Metabolic Panel: Recent Labs  Lab 03/25/21 0308 03/26/21 0014 03/27/21 0024 03/28/21 0039 03/29/21 0144  NA 140 138 133* 134* 134*  K 3.4* 3.6 3.6 4.2 4.3  CL 108 103 100 102 101  CO2 GLUCOSE 116* 97 94 92 99  BUN 13 23 33* 35* 32*  CREATININE 1.15* 1.58* 1.55* 1.88* 1.49*  CALCIUM 9.0 9.2 8.8* 8.7* 9.2  MG  --  2.1 2.1 2.2  --   PHOS  --  4.4  --   --   --    GFR Estimated Creatinine Clearance: 23.5 mL/min (A) (by C-G formula based on SCr of 1.49 mg/dL (H)). Liver Function Tests: Recent Labs  Lab 03/24/21 1550  AST 20  ALT 11  ALKPHOS 61  BILITOT 0.7  PROT 7.5  ALBUMIN 3.6   No results for input(s): LIPASE, AMYLASE in the last 168 hours. No results for input(s): AMMONIA in the last 168 hours. Coagulation profile No results for input(s): INR, PROTIME in the last 168 hours.  CBC: Recent Labs  Lab 03/24/21 1550 03/25/21 0612 03/26/21 0014 03/27/21 0024 03/28/21 0039  WBC 6.8 6.2 6.1 6.2 6.8  NEUTROABS 3.6  --   --   --   --   HGB 10.2* 9.5* 9.2* 8.5* 8.4*  HCT 31.7* 28.4* 27.0* 25.2* 25.7*  MCV 63.8* 63.1* 61.9* 61.8* 62.8*  PLT 260 255 236 238 233   Cardiac Enzymes: No results for input(s): CKTOTAL, CKMB, CKMBINDEX, TROPONINI in the last 168 hours. BNP: Invalid input(s): POCBNP CBG: No results for input(s): GLUCAP in the last 168 hours. D-Dimer No results for input(s): DDIMER in the last 72 hours. Hgb A1c No results for input(s): HGBA1C in the last 72 hours. Lipid Profile No results for input(s): CHOL, HDL, LDLCALC, TRIG, CHOLHDL, LDLDIRECT in the last 72 hours. Thyroid function studies No results for input(s): TSH, T4TOTAL, T3FREE, THYROIDAB in the last 72 hours.  Invalid input(s): FREET3 Anemia  work up No results for input(s): VITAMINB12, FOLATE, FERRITIN, TIBC, IRON, RETICCTPCT in the last 72 hours. Microbiology Recent Results (from the past 240 hour(s))  SARS CORONAVIRUS 2 (TAT 6-24 HRS) Nasopharyngeal Nasopharyngeal Swab     Status: None   Collection Time: 03/24/21  8:14 PM   Specimen: Nasopharyngeal Swab  Result Value Ref Range Status   SARS Coronavirus 2 NEGATIVE NEGATIVE Final  Comment: (NOTE) SARS-CoV-2 target nucleic acids are NOT DETECTED.  The SARS-CoV-2 RNA is generally detectable in upper and lower respiratory specimens during the acute phase of infection. Negative results do not preclude SARS-CoV-2 infection, do not rule out co-infections with other pathogens, and should not be used as the sole basis for treatment or other patient management decisions. Negative results must be combined with clinical observations, patient history, and epidemiological information. The expected result is Negative.  Fact Sheet for Patients: HairSlick.no  Fact Sheet for Healthcare Providers: quierodirigir.com  This test is not yet approved or cleared by the [place] FDA and  has been authorized for detection and/or diagnosis of SARS-CoV-2 by FDA under an Emergency Use Authorization (EUA). This EUA will remain  in effect (meaning this test can be used) for the duration of the COVID-19 declaration under Se ction 564(b)(1) of the Act, 21 U.S.C. section 360bbb-3(b)(1), unless the authorization is terminated or revoked sooner.  Performed at [facility], [address] [place], [place] [address]      Discharge Instructions:   Discharge Instructions    Diet - low sodium heart healthy   Complete by: As directed    Discharge instructions   Complete by: As directed    Follow-up with your primary care physician in 1 week.  Low-salt diet.  Fluid restriction 1500 mls per day.  Start taking lisinopril from  03/31/2021.  Take Lasix (water pill) when you gain weight (3 pounds or more in 24 hours) or experience leg swelling/increased trouble breathing.   Heart Failure patients record your daily weight using the same scale at the same time of day   Complete by: As directed    Increase activity slowly   Complete by: As directed    STOP any activity that causes chest pain, shortness of breath, dizziness, sweating, or exessive weakness   Complete by: As directed      Allergies as of 03/29/2021      Reactions   Diovan [valsartan]    Pt reports not allergic 03/25/21   Doxazosin    Pt reports not allergic 03/25/21      Medication List    STOP taking these medications   cholecalciferol 1000 units tablet Commonly known as: VITAMIN D   diclofenac Sodium 1 % Gel Commonly known as: Voltaren   levothyroxine 75 MCG tablet Commonly known as: SYNTHROID   traZODone 50 MG tablet Commonly known as: DESYREL     TAKE these medications   amLODipine 10 MG tablet Commonly known as: NORVASC Take 1 tablet (10 mg total) by mouth at bedtime.   cloNIDine 0.1 MG tablet Commonly known as: CATAPRES Take 1 tablet (0.1 mg total) by mouth 2 (two) times daily.   feeding supplement Liqd Take 237 mLs by mouth 2 (two) times daily between meals.   furosemide 40 MG tablet Commonly known as: Lasix Take 0.5 tablets (20 mg total) by mouth daily as needed for fluid or edema (3lbs weight gain in 24 hours).   hydrALAZINE 50 MG tablet Commonly known as: APRESOLINE Take 1 tablet (50 mg total) by mouth 3 (three) times daily. Take with food.   lisinopril 10 MG tablet Commonly known as: ZESTRIL Take 2 tablets (20 mg total) by mouth daily. Start taking on: Mar 31, 2021 What changed:   how much to take  These instructions start on Mar 31, 2021. If you are unsure what to do until then, ask your doctor or other care provider.  Durable Medical Equipment  (From admission, onward)         Start      Ordered   03/28/21 1611  For home use only DME Walker rolling  Once       Question Answer Comment  Walker: With 5 Inch Wheels   Patient needs a walker to treat with the following condition Weakness      03/28/21 1611          Follow-up Information    [facility] Follow up.   Specialty: Home Health Services Why: HHPT Contact information: [address] [place] [address] [phone]        [facility] Follow up.   Why: rolling walker Contact information: [address] [place] [address] [phone]        Wynn Banker, MD. Go to.   Specialty: Family Medicine Why: your scheduled appointment Contact information: [address] [place] [address] [place] [address] [phone]                Time coordinating discharge: 39 minutes  Signed:  Ambria Mayfield  [facility] 03/29/2021, 8:30 AM

## 2021-03-31 ENCOUNTER — Telehealth: Payer: Self-pay

## 2021-03-31 NOTE — Telephone Encounter (Signed)
Transition Care Management Unsuccessful Follow-up Telephone Call  Date of discharge and from where:  03/29/2021 [place]  Attempts:  1st Attempt  Reason for unsuccessful TCM follow-up call:  No answer/busy

## 2021-04-07 ENCOUNTER — Telehealth: Payer: Self-pay | Admitting: Family Medicine

## 2021-04-07 NOTE — Telephone Encounter (Signed)
Amanda w/[facility] is calling in stating that she is wanting to know if Dr. Hassan Rowan will be the signing physician for [facility] orders that [facility] sent in for the pt have after her discharge.  May leave a detail msg on her secured voice mail.

## 2021-04-08 NOTE — Telephone Encounter (Signed)
I can sign for orders that are being done after discharge/home health.

## 2021-04-11 NOTE — Telephone Encounter (Signed)
Left a detailed message on Amanda's voicemail with the approval as below.

## 2021-04-19 ENCOUNTER — Other Ambulatory Visit: Payer: Self-pay

## 2021-04-20 ENCOUNTER — Ambulatory Visit (INDEPENDENT_AMBULATORY_CARE_PROVIDER_SITE_OTHER): Payer: Medicare HMO | Admitting: Family Medicine

## 2021-04-20 ENCOUNTER — Encounter: Payer: Self-pay | Admitting: Family Medicine

## 2021-04-20 VITALS — BP 152/52 | HR 72 | Temp 98.6°F | Ht 66.0 in | Wt 145.0 lb

## 2021-04-20 DIAGNOSIS — K5900 Constipation, unspecified: Secondary | ICD-10-CM | POA: Diagnosis not present

## 2021-04-20 DIAGNOSIS — N1831 Chronic kidney disease, stage 3a: Secondary | ICD-10-CM

## 2021-04-20 DIAGNOSIS — E890 Postprocedural hypothyroidism: Secondary | ICD-10-CM

## 2021-04-20 DIAGNOSIS — I10 Essential (primary) hypertension: Secondary | ICD-10-CM

## 2021-04-20 DIAGNOSIS — I509 Heart failure, unspecified: Secondary | ICD-10-CM

## 2021-04-20 DIAGNOSIS — R634 Abnormal weight loss: Secondary | ICD-10-CM | POA: Diagnosis not present

## 2021-04-20 MED ORDER — LISINOPRIL 10 MG PO TABS: 10.0000 mg | ORAL_TABLET | Freq: Every day | ORAL | 3 refills | Status: DC

## 2021-04-20 MED ORDER — POLYETHYLENE GLYCOL 3350 17 GM/SCOOP PO POWD: 17.0000 g | Freq: Two times a day (BID) | ORAL | 1 refills | Status: DC | PRN

## 2021-04-20 MED ORDER — HYDRALAZINE HCL 25 MG PO TABS: 25.0000 mg | ORAL_TABLET | Freq: Three times a day (TID) | ORAL | 1 refills | Status: DC

## 2021-04-20 MED ORDER — COMFORT TOUCH BP CUFF/LARGE MISC: 1.0000 | Freq: Every day | 0 refills | Status: DC | PRN

## 2021-04-20 MED ORDER — CLONIDINE 0.1 MG/24HR TD PTWK: 0.1000 mg | MEDICATED_PATCH | TRANSDERMAL | 12 refills | Status: DC

## 2021-04-20 NOTE — Patient Instructions (Addendum)
Continue with lisinopril 10mg  daily Stop the clonidine pill; we are going to have you start this in patch form - once you pick this up from the pharmacy.  Stop the 50mg  of the hydralazine and change to 25mg  hydralazine three times daily. We will continue to try and work off this medication if possible.   Miralax is good for constipation.

## 2021-04-20 NOTE — Progress Notes (Signed)
Linda Lucero DOB: [birth year] Encounter date: 04/20/2021  This is a 87 y.o. female who presents with Chief Complaint  Patient presents with   Follow-up    History of present illness: Hospital admission 03/24/21-03/29/21. Discharged home with home health. Suggested cbc, bmp, mg at visit.   Acute on chronic diastolic HF, htn. Bnp elevated to 438 in ER. Trop elevated on intake.   States that breathing is off and on. Was very short of breath and high bp which prompted ER visit. Hasn't had heart pounding at home like she did when she went to ER.   This morning didn't feel well when she got up; feels better now. Hasn't been checking pressures at home because machine wasn't working.   Appetite is poor - noted in last 6 months. Just has to force self to eat.   At night has chills - takes one levothyroxine daily, but not getting them nightly. Does wake up with sweats at night. This is not new for her. Did have covid test in hospital that was negative.   Lower back bothering her.   No headaches. No abdominal pain.   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   *she does not have amlodipine with her home meds here today; her home med lisinopril is at 10mg  daily dose (we had 20mg  listed).     Allergies  Allergen Reactions   Diovan [Valsartan]     Pt reports not allergic 03/25/21   Doxazosin     Pt reports not allergic 03/25/21   Current Meds  Medication Sig   amLODipine (NORVASC) 10 MG tablet Take 1 tablet (10 mg total) by mouth at bedtime.   cloNIDine (CATAPRES) 0.1 MG tablet Take 1 tablet (0.1 mg total) by mouth 2 (two) times daily.   feeding supplement (ENSURE ENLIVE / ENSURE PLUS) LIQD Take 237 mLs by mouth 2 (two) times daily between meals.   furosemide (LASIX) 40 MG tablet Take 0.5 tablets (20 mg total) by mouth daily as needed for fluid or edema (3lbs weight gain in 24 hours).   hydrALAZINE (APRESOLINE) 50 MG tablet Take 1 tablet (50 mg total) by  mouth 3 (three) times daily. Take with food.   lisinopril (ZESTRIL) 10 MG tablet Take 2 tablets (20 mg total) by mouth daily.    Review of Systems  Constitutional:  Positive for fatigue and unexpected weight change (weight loss, decreased appetite). Negative for chills and fever.  Respiratory:  Negative for cough, chest tightness, shortness of breath and wheezing.   Cardiovascular:  Negative for chest pain, palpitations and leg swelling.  Gastrointestinal:  Negative for abdominal pain, blood in stool, constipation and diarrhea.  Genitourinary:  Negative for hematuria.  Neurological:  Positive for weakness and light-headedness (after taking medications - hasn't checked pressures but feels she may drop with hydralazine/clonidine).   Objective:  BP (!) 152/52 (BP Location: Left Arm, Patient Position: Sitting, Cuff Size: Large)   Pulse 72   Temp 98.6 F (37 C) (Oral)   Ht 5\' 6"  (1.676 m)   Wt 145 lb (65.8 kg)   LMP  (LMP Unknown)   SpO2 95%   BMI 23.40 kg/m   Weight: 145 lb (65.8 kg)   BP Readings from Last 3 Encounters:  04/20/21 (!) 152/52  03/29/21 (!) 136/41  03/08/21 (!) 164/84   Wt Readings from Last 3 Encounters:  04/20/21 145 lb (65.8 kg)  03/29/21 145 lb 8.1 oz (66 kg)  10/27/20 160  lb 1.6 oz (72.6 kg)    Physical Exam Constitutional:      General: She is not in acute distress.    Appearance: She is well-developed.  Cardiovascular:     Rate and Rhythm: Normal rate and regular rhythm.     Heart sounds: Murmur heard.  Systolic murmur is present with a grade of 1/6.    No friction rub.  Pulmonary:     Effort: Pulmonary effort is normal. No respiratory distress.     Breath sounds: Normal breath sounds. No wheezing or rales.  Musculoskeletal:     Right lower leg: No edema.     Left lower leg: No edema.  Neurological:     Mental Status: She is alert and oriented to person, place, and time.  Psychiatric:        Behavior: Behavior normal.    Assessment/Plan  1.  Congestive heart failure, unspecified HF chronicity, unspecified heart failure type (HCC) Symptomatically stable. No fluid overload.  2. Hypertension, unspecified type Her cuff broke at home so she hasn't been monitoring pressures. I am worried about low diastolic. She was not taking meds according to hosp discharge.  *she was not taking amlodipine; this was given at the hospital but we will remove from list *she was taking lisinopril 10mg  rather than 20mg  which was given at hospital. We will continue with 10mg  lisinopril.  *we will change clonidine from 0.1mg  bid to 0.1mg  patch daily *decrease hydralazine to 25mg  TID with hopes of weaning off this medication.   - CBC with Differential/Platelet; Future - Comprehensive metabolic panel; Future - Comprehensive metabolic panel - CBC with Differential/Platelet  3. Chronic kidney disease, stage 3a (HCC) Will recheck bloodwork today.   4. Constipation, unspecified constipation type She has not been eating well; some constipation. Encouraged her to try miralax. - polyethylene glycol powder (GLYCOLAX/MIRALAX) 17 GM/SCOOP powder; Take 17 g by mouth 2 (two) times daily as needed.  Dispense: 3350 g; Refill: 1  5. Weight loss Appetite is poor; she has some depressed symptoms as well. She has not even been going to church (although father has been bringing communion to her house so this is comforting to her). We discussed working on trying to avoid the big drops in her blood pressure which may be causing more fatigue. I worry about the diastolic being so low. I will check in with her once I get bloodwork back.  - Magnesium, RBC; Future - Prealbumin; Future - Prealbumin - Magnesium, RBC  6. Postablative hypothyroidism - TSH; Future - TSH   Return in about 1 month (around 05/20/2021) for Chronic condition visit. 45 minutes spent in chart review, medication review with patient and clarification of medication changes, exam, charting. Also discussed  patient with in house pharmacist since she is under her care as well.     , MD

## 2021-04-21 LAB — CBC WITH DIFFERENTIAL/PLATELET
Basophils Relative: 1.6 % (ref 0.0–3.0)
Eosinophils Relative: 0.4 % (ref 0.0–5.0)
HCT: 30.5 % — ABNORMAL LOW (ref 36.0–46.0)
Hemoglobin: 10.1 g/dL — ABNORMAL LOW (ref 12.0–15.0)
Lymphocytes Relative: 27.7 % (ref 12.0–46.0)
MCHC: 33.1 g/dL (ref 30.0–36.0)
MCV: 63.4 fl — ABNORMAL LOW (ref 78.0–100.0)
Monocytes Relative: 5.6 % (ref 3.0–12.0)
Neutrophils Relative %: 64.7 % (ref 43.0–77.0)
Platelets: 300 10*3/uL (ref 150.0–400.0)
RBC: 4.81 Mil/uL (ref 3.87–5.11)
RDW: 19.2 % — ABNORMAL HIGH (ref 11.5–15.5)
WBC: 7 10*3/uL (ref 4.0–10.5)

## 2021-04-21 LAB — COMPREHENSIVE METABOLIC PANEL
ALT: 9 U/L (ref 0–35)
AST: 17 U/L (ref 0–37)
Albumin: 4.1 g/dL (ref 3.5–5.2)
Alkaline Phosphatase: 67 U/L (ref 39–117)
BUN: 27 mg/dL — ABNORMAL HIGH (ref 6–23)
CO2: 23 mEq/L (ref 19–32)
Calcium: 9.8 mg/dL (ref 8.4–10.5)
Chloride: 102 mEq/L (ref 96–112)
Creatinine, Ser: 1.56 mg/dL — ABNORMAL HIGH (ref 0.40–1.20)
GFR: 29.06 mL/min — ABNORMAL LOW (ref >60.00)
Glucose, Bld: 115 mg/dL — ABNORMAL HIGH (ref 70–99)
Potassium: 4.3 mEq/L (ref 3.5–5.1)
Sodium: 135 mEq/L (ref 135–145)
Total Bilirubin: 0.4 mg/dL (ref 0.2–1.2)
Total Protein: 7.6 g/dL (ref 6.0–8.3)

## 2021-04-21 LAB — TSH: TSH: 5.46 u[IU]/mL — ABNORMAL HIGH (ref 0.35–4.50)

## 2021-04-23 LAB — PREALBUMIN: Prealbumin: 23 mg/dL (ref 17–34)

## 2021-04-23 LAB — MAGNESIUM, RBC: Magnesium RBC: 6.6 mg/dL — ABNORMAL HIGH (ref 4.0–6.4)

## 2021-04-25 ENCOUNTER — Other Ambulatory Visit: Payer: Self-pay | Admitting: Family Medicine

## 2021-04-29 ENCOUNTER — Other Ambulatory Visit: Payer: Self-pay

## 2021-04-29 ENCOUNTER — Other Ambulatory Visit (INDEPENDENT_AMBULATORY_CARE_PROVIDER_SITE_OTHER): Payer: Medicare HMO

## 2021-04-29 LAB — MAGNESIUM: Magnesium: 2.4 mg/dL (ref 1.5–2.5)

## 2021-05-02 LAB — MAGNESIUM, RBC: Magnesium RBC: 6.1 mg/dL (ref 4.0–6.4)

## 2021-05-13 ENCOUNTER — Ambulatory Visit (INDEPENDENT_AMBULATORY_CARE_PROVIDER_SITE_OTHER): Payer: Medicare HMO | Admitting: Pharmacist

## 2021-05-13 DIAGNOSIS — N1831 Chronic kidney disease, stage 3a: Secondary | ICD-10-CM | POA: Diagnosis not present

## 2021-05-13 DIAGNOSIS — I509 Heart failure, unspecified: Secondary | ICD-10-CM

## 2021-05-13 DIAGNOSIS — I10 Essential (primary) hypertension: Secondary | ICD-10-CM

## 2021-05-13 NOTE — Progress Notes (Signed)
Chronic Care Management Pharmacy Note  05/13/2021 Name:  Linda Lucero MRN:  [id number] DOB:  05/30/1928  Summary: Patient just restarted checking blood pressure at home yesterday with a new wrist cuff  Recommendations/Changes made from today's visit: -Recommended to restart the lisinopril  -Recommended continued daily BP monitoring at home and bringing her new wrist cuff to office visit with PCP to compare readings  Plan: Follow up with PCP in < 2 weeks.   Subjective: Linda Lucero is an 87 y.o. year old female who is a primary patient of Koberlein, Steele Berg, MD.  The CCM team was consulted for assistance with disease management and care coordination needs.    Engaged with patient face to face for follow up visit in response to provider referral for pharmacy case management and/or care coordination services.   Consent to Services:  The patient was given information about Chronic Care Management services, agreed to services, and gave verbal consent prior to initiation of services.  Please see initial visit note for detailed documentation.   Patient Care Team: Caren Macadam, MD as PCP - General (Family Medicine) Viona Gilmore, [facility] as Pharmacist (Pharmacist)  Recent office visits: 04/20/21 Micheline Rough, MD: Patient presented for hospitalization follow up. Prescribed clonidine patch. Decreased hydralazine to 25 mg TID. Decreased lisinopril to 10 mg daily.  01/25/21 Telephone encounter (CCM): Lisinopril increased to 20 mg.   12/30/20 Jeni Salles, RPH: Patient presented for CCM visit. Lisinopril started at 10 mg daily and aspirin decreased to 81 mg daily.  10/27/20 Micheline Rough, MD: Patient presented for chronic conditions follow up. Recommended Zinc 50 mg/day as zinc level was low. Prescribed diclofenac gel for arthritis.  Recent consult visits: 5/19-5/24/22 Patient admitted for HF exacerbation. D/c'd levothyroxine, trazodone, vitamin D. Continued amlodipine  10 mg daily, clonidine 0.1 mg BID, furosemide PRN, hydralazine 50 mg TID, and lisinopril 20 mg daily.  05/07/20: Patient presented to ED for hypertensive urgency. Patient only taking hydralazine twice daily instead of TID.   Hospital visits: None in previous 6 months  Objective:  Lab Results  Component Value Date   CREATININE 1.56 (H) 04/20/2021   BUN 27 (H) 04/20/2021   GFR 29.06 (L) 04/20/2021   GFRNONAA 33 (L) 03/29/2021   GFRAA 40 (L) 05/07/2020   NA 135 04/20/2021   K 4.3 04/20/2021   CALCIUM 9.8 04/20/2021   CO2 23 04/20/2021    Lab Results  Component Value Date/Time   GFR 29.06 (L) 04/20/2021 03:06 PM   GFR 49.71 (L) 12/19/2019 02:04 PM    Last diabetic Eye exam: No results found for: HMDIABEYEEXA  Last diabetic Foot exam: No results found for: HMDIABFOOTEX   Lab Results  Component Value Date   CHOL 222 (H) 05/14/2020   HDL 93 05/14/2020   LDLCALC 113 (H) 05/14/2020   TRIG 75 05/14/2020   CHOLHDL 2.4 05/14/2020    Hepatic Function Latest Ref Rng & Units 04/20/2021 03/24/2021 05/14/2020  Total Protein 6.0 - 8.3 g/dL 7.6 7.5 7.1  Albumin 3.5 - 5.2 g/dL 4.1 3.6 -  AST 0 - 37 U/L _0 ALT 0 - 35 U/L _1 Alk Phosphatase 39 - 117 U/L 67 61 -  Total Bilirubin 0.2 - 1.2 mg/dL 0.4 0.7 0.4    Lab Results  Component Value Date/Time   TSH 5.46 (H) 04/20/2021 03:06 PM   TSH 6.942 (H) 03/26/2021 12:14 AM   TSH 4.35 10/27/2020 04:13 PM   FREET4 1.2  10/27/2020 04:13 PM   FREET4 0.71 12/25/2018 03:16 PM    CBC Latest Ref Rng & Units 04/20/2021 03/28/2021 03/27/2021  WBC 4.0 - 10.5 K/uL 7.0 6.8 6.2  Hemoglobin 12.0 - 15.0 g/dL 10.1(L) 8.4(L) 8.5(L)  Hematocrit 36.0 - 46.0 % 30.5(L) 25.7(L) 25.2(L)  Platelets 150.0 - 400.0 K/uL 300.0 233 238    No results found for: VD25OH  Clinical ASCVD: No  The ASCVD Risk score Mikey Bussing DC Jr., et al., 2013) failed to calculate for the following reasons:   The 2013 ASCVD risk score is only valid for ages 39 to 19     Depression screen PHQ 2/9 05/25/2020 05/14/2020 12/25/2018  Decreased Interest 0 1 1  Down, Depressed, Hopeless 0 3 1  PHQ - 2 Score 0 4 2  Altered sleeping - - 1  Tired, decreased energy - - 1  Change in appetite - - 1  Feeling bad or failure about yourself  - - 1  Trouble concentrating - - 0  Moving slowly or fidgety/restless - - 0  Suicidal thoughts - - 0  PHQ-9 Score - - 6      Social History   Tobacco Use  Smoking Status Former   Pack years: 0.00   Types: Cigarettes  Smokeless Tobacco Never   BP Readings from Last 3 Encounters:  04/20/21 (!) 152/52  03/29/21 (!) 136/41  03/08/21 (!) 164/84   Pulse Readings from Last 3 Encounters:  04/20/21 72  03/29/21 61  10/27/20 82   Wt Readings from Last 3 Encounters:  04/20/21 145 lb (65.8 kg)  03/29/21 145 lb 8.1 oz (66 kg)  10/27/20 160 lb 1.6 oz (72.6 kg)    Assessment/Interventions: Review of patient past medical history, allergies, medications, health status, including review of consultants reports, laboratory and other test data, was performed as part of comprehensive evaluation and provision of chronic care management services.   SDOH:  (Social Determinants of Health) assessments and interventions performed: No   CCM Care Plan  Allergies  Allergen Reactions   Diovan [Valsartan]     Pt reports not allergic 03/25/21   Doxazosin     Pt reports not allergic 03/25/21    Medications Reviewed Today     Reviewed by Viona Gilmore, [facility] (Pharmacist) on 05/13/21 at [place] List Status: <None>   Medication Order Taking? Sig Documenting Provider Last Dose Status Informant  Blood Pressure Monitoring (COMFORT TOUCH BP CUFF/LARGE) MISC [id number]  1 Device by Does not apply route daily as needed. Caren Macadam, MD  Active   cloNIDine (CATAPRES - DOSED IN MG/24 HR) 0.1 mg/24hr patch [id number] No Place 1 patch (0.1 mg total) onto the skin once a week.  Patient not taking: Reported on 05/13/2021   Caren Macadam,  MD Not Taking Active   cloNIDine (CATAPRES) 0.1 MG tablet [id number] Yes Take 0.1 mg by mouth 2 (two) times daily. [provider] Taking Active   feeding supplement (ENSURE ENLIVE / ENSURE PLUS) LIQD [id number]  Take 237 mLs by mouth 2 (two) times daily between meals. Pokhrel, Corrie Mckusick, MD  Active   furosemide (LASIX) 40 MG tablet [id number]  Take 0.5 tablets (20 mg total) by mouth daily as needed for fluid or edema (3lbs weight gain in 24 hours). Pokhrel, Laxman, MD  Active   hydrALAZINE (APRESOLINE) 25 MG tablet [id number] Yes Take 1 tablet (25 mg total) by mouth 3 (three) times daily. Caren Macadam, MD Taking Active   lisinopril (ZESTRIL) 10 MG  tablet [id number] No Take 1 tablet (10 mg total) by mouth daily.  Patient not taking: Reported on 05/13/2021   Caren Macadam, MD Not Taking Active   polyethylene glycol powder (GLYCOLAX/MIRALAX) 17 GM/SCOOP powder [id number]  Take 17 g by mouth 2 (two) times daily as needed. Caren Macadam, MD  Active             Patient Active Problem List   Diagnosis Date Noted   Malnutrition of moderate degree 03/28/2021   CHF (congestive heart failure) ([place]) 03/25/2021   Acute on chronic diastolic CHF (congestive heart failure) (HCC)    Elevated troponin    Acute CHF ([place]) 03/24/2021   Coronary artery disease    Low back pain 09/06/2018   Hypertension 09/06/2018   Chronic kidney disease, stage 3a ([place]) 09/06/2018   Hypothyroidism 09/06/2018    Immunization History  Administered Date(s) Administered   Fluad Quad(high Dose 65+) 10/27/2020   Influenza, High Dose Seasonal PF 09/04/2018, 08/01/2019   Influenza-Unspecified 08/01/2019   PFIZER(Purple Top)SARS-COV-2 Vaccination 01/11/2020, 02/10/2020, 08/17/2020, 02/26/2021   Pneumococcal Conjugate-13 10/02/2016   Zoster, Live 10/02/2016   Patient has been feeling much better with the decrease in dose of hydralazine. She doesn't feel as wobbly anymore. She also stopped taking the  lisinopril on her own as she didn't realize she needed to continue on it. She also never picked up the clonidine patches as they were too expensive and continued taking the tablets.  Patient also continues to weigh herself daily and has not noticed a change in her weight since leaving the hospital and has not taken any furosemide.  Conditions to be addressed/monitored:  Hypertension, Chronic Kidney Disease, Hypothyroidism, Osteoarthritis and Insomnia  Care Plan : CCM Pharmacy Care Plan  Updates made by Viona Gilmore, [place] since 05/13/2021 12:00 AM     Problem: Problem: Hypertension, Chronic Kidney Disease, Hypothyroidism, Osteoarthritis and Insomnia      Long-Range Goal: Patient-Specific Goal   Start Date: 12/30/2020  Expected End Date: 12/30/2021  Recent Progress: On track  Priority: High  Note:   Current Barriers:  Unable to independently monitor therapeutic efficacy Unable to achieve control of blood pressure   Pharmacist Clinical Goal(s):  Patient will achieve control of hypertension as evidenced by blood pressure readings  through collaboration with PharmD and provider.   Interventions: 1:1 collaboration with Caren Macadam, MD regarding development and update of comprehensive plan of care as evidenced by provider attestation and co-signature Inter-disciplinary care team collaboration (see longitudinal plan of care) Comprehensive medication review performed; medication list updated in electronic medical record  Hypertension (BP goal <140/90) -Uncontrolled -Current treatment: Hydralazine 25 mg three times daily Clonidine 0.1 mg one tablet twice daily Lisinopril 10 mg 1 tablet once daily - stopped taking on her own -Medications previously tried: HCTZ (side effects), losartan (unknown), valsartan -Current home readings:  148/85, 151/77 HR 52 -Current dietary habits: doesn't eat a lot of salt when cooking and does not eat out; doesn't eat processed foods and drinks 5  glasses of water per day -Current exercise habits: does not sit down all day but no structured exercise -Denies hypotensive/hypertensive symptoms -Educated on Importance of home blood pressure monitoring; Proper BP monitoring technique; Symptoms of hypotension and importance of maintaining adequate hydration; -Counseled to monitor BP at home daily document, and provide log at future appointments -Recommended to continue current medication Recommended to restart lisinopril as patient stopped on her own and to bring BP cuff with her to her PCP  appointment  Insomnia (Goal: improve quality and quantity of sleep) -Controlled -Current treatment  No medications -Medications previously tried: trazodone -Recommended to continue current medication Counseled on practicing good sleep hygiene by setting a sleep schedule and maintaining it, avoid excessive napping, following a nightly routine, avoiding screen time for 30-60 minutes before going to bed, and making the bedroom a cool, quiet and dark space  Hypothyroidism (Goal: TSH 0.35-4.5) -Controlled -Current treatment  No medications -Medications previously tried: levothyroxine -Recommended to continue current medication   Osteoarthritis (Goal: minimize pain associated with arthritis) -Controlled -Current treatment  Diclofenac 1% gel apply as needed Aspirin 81 mg daily   Tylenol as needed -Medications previously tried: none  -Counseled on risk of bleeding with aspirin and kidney damage Recommended use of Tylenol more often than aspirin   CKD (Goal: prevent further worsening of kidneys) -Controlled -Current treatment  Lisinopril 10 mg daily - not taking -Medications previously tried: losartan, valsartan  -Counseled on avoidance of NSAIDs for worsening kidney function   Health Maintenance -Vaccine gaps: tetanus -Current therapy:  Zinc 50 mg daily  Vitamin D 1000 units daily  Multivitamin daily  -Educated on Cost vs benefit of each  product must be carefully weighed by individual consumer -Patient is satisfied with current therapy and denies issues -Recommended to continue current medication  Patient Goals/Self-Care Activities Patient will:  - take medications as prescribed check blood pressure twice weekly, document, and provide at future appointments  Follow Up Plan: The care management team will reach out to the patient again over the next 30 days.         Medication Assistance: None required.  Patient affirms current coverage meets needs.  Compliance/Adherence/Medication fill history: Care Gaps: shingrix, tetanus, DEXA, pneumovax  Star-Rating Drugs: Lisinopril 10 mg - last [facility]/05/21 [facility]  Patient's preferred [facility] is:  [id number] [place] [place]Phone: [phone]: [phone] Uses pill box? No - different timing of medications Pt endorses 99% compliance  We discussed: Benefits of medication synchronization, packaging and delivery as well as enhanced pharmacist oversight with [employer]. Patient decided to: Continue current medication management strategy  Care Plan and Follow Up Patient Decision:  Patient agrees to Care Plan and Follow-up.  Plan: Face to Face appointment with care management team member scheduled for: 943days   MJeni Salles PharmD [place]stownPharmacist [id number] [phone]

## 2021-05-16 ENCOUNTER — Telehealth: Payer: Self-pay | Admitting: Pharmacist

## 2021-05-16 NOTE — Telephone Encounter (Signed)
-----   Message from Wynn Banker, MD sent at 05/15/2021  9:48 AM EDT ----- Noted on the lisinopril.   I don't understand why they stopped levothyroxine? I can't see any reason; she wasn't overtreated with this. I would like her to restart. Just confirm home dose (or I will at her follow up).   Theodis Shove, MD   ----- Message ----- From: Verner Chol, [facility] Sent: 05/13/2021   1:00 PM EDT To: Wynn Banker, MD  Just Linda Lucero - she stopped taking the lisinopril on her own a few days ago because she didn't think she needed it anymore. I asked her to restart it. Also, she just bought a new wrist BP cuff this week and has started checking at home and I asked her to bring it with her to your visit to compare the readings.  Also, wasn't sure if you were planning on restarting her levothyroxine based on her TSH? They had discontinued it at discharge.

## 2021-05-16 NOTE — Telephone Encounter (Signed)
Called patient to confirm if she restarted taking the levothyroxine. Patient did restart taking levothyroxine 75 mcg on her own despite the note to stop at discharge.  Recommended for her to keep taking it. Will route to PCP to make her aware.

## 2021-05-23 ENCOUNTER — Other Ambulatory Visit: Payer: Self-pay | Admitting: Family Medicine

## 2021-05-23 DIAGNOSIS — I10 Essential (primary) hypertension: Secondary | ICD-10-CM

## 2021-05-23 NOTE — Telephone Encounter (Signed)
Hydralazine was decreased to 25mg  TID at last visit; please confirm she is taking this and let me know if she needs refill of this medication.

## 2021-05-23 NOTE — Telephone Encounter (Signed)
Patient informed of the message below and stated there must have been an error as she picked up a refill of the Hydralazine 25mg  which she is taking three times a day.  Message sent to PCP.

## 2021-05-25 ENCOUNTER — Ambulatory Visit (INDEPENDENT_AMBULATORY_CARE_PROVIDER_SITE_OTHER): Payer: Medicare HMO

## 2021-05-25 ENCOUNTER — Encounter: Payer: Self-pay | Admitting: Family Medicine

## 2021-05-25 ENCOUNTER — Other Ambulatory Visit: Payer: Self-pay

## 2021-05-25 ENCOUNTER — Ambulatory Visit (INDEPENDENT_AMBULATORY_CARE_PROVIDER_SITE_OTHER): Payer: Medicare HMO | Admitting: Family Medicine

## 2021-05-25 VITALS — BP 140/80 | HR 60 | Temp 98.1°F | Ht 66.0 in | Wt 147.0 lb

## 2021-05-25 VITALS — BP 140/80 | HR 60 | Temp 98.1°F | Wt 147.0 lb

## 2021-05-25 DIAGNOSIS — Z780 Asymptomatic menopausal state: Secondary | ICD-10-CM

## 2021-05-25 DIAGNOSIS — N1831 Chronic kidney disease, stage 3a: Secondary | ICD-10-CM

## 2021-05-25 DIAGNOSIS — I10 Essential (primary) hypertension: Secondary | ICD-10-CM

## 2021-05-25 DIAGNOSIS — E890 Postprocedural hypothyroidism: Secondary | ICD-10-CM | POA: Diagnosis not present

## 2021-05-25 DIAGNOSIS — Z0000 Encounter for general adult medical examination without abnormal findings: Secondary | ICD-10-CM

## 2021-05-25 DIAGNOSIS — Z23 Encounter for immunization: Secondary | ICD-10-CM

## 2021-05-25 NOTE — Progress Notes (Signed)
[name] DOB: 25-Mar-1928 Encounter date: 05/25/2021  This is a 87 y.o. female who presents with Chief Complaint  Patient presents with   Follow-up     History of present illness: For the past couple of weeks has been feeling a little better - not as sick, nauseated. Just feels like with walking she is falling. Hasn't had any falls. Not really going out much. She states that the sensation of falling forward comes on when she walks a lot; but comes and goes. Worse in morning. Does tend to note this more when bp is high. Longer she walks the more likely she is to have that sensation. No numbness; occasionally has some tingling in left lower foot.   Not feeling dizzy, no headaches.   She did go to [facility] to buy cuff and they were out of them. Only thing she could get was wrist cuff. Last week was getting systolic in the 150's; about 150 today.   Appetite is doing better.   Clonidine patch was too expensive for her. She went back to taking the 0.1mg  tablet BID. She feels much better since decreasing the hydralazine to 25mg  TID. She isn't noting the nausea, weakness after taking.    Allergies  Allergen Reactions   Diovan [Valsartan]     Pt reports not allergic 03/25/21   Doxazosin     Pt reports not allergic 03/25/21   Current Meds  Medication Sig   Blood Pressure Monitoring (COMFORT TOUCH BP CUFF/LARGE) MISC 1 Device by Does not apply route daily as needed.   cloNIDine (CATAPRES - DOSED IN MG/24 HR) 0.1 mg/24hr patch Place 1 patch (0.1 mg total) onto the skin once a week.   cloNIDine (CATAPRES) 0.1 MG tablet Take 0.1 mg by mouth 2 (two) times daily.   feeding supplement (ENSURE ENLIVE / ENSURE PLUS) LIQD Take 237 mLs by mouth 2 (two) times daily between meals.   furosemide (LASIX) 40 MG tablet Take 0.5 tablets (20 mg total) by mouth daily as needed for fluid or edema (3lbs weight gain in 24 hours).   hydrALAZINE (APRESOLINE) 25 MG tablet Take 1 tablet (25 mg total) by mouth 3  (three) times daily.   levothyroxine (SYNTHROID) 75 MCG tablet Take 75 mcg by mouth daily.   lisinopril (ZESTRIL) 10 MG tablet Take 1 tablet (10 mg total) by mouth daily.   polyethylene glycol powder (GLYCOLAX/MIRALAX) 17 GM/SCOOP powder Take 17 g by mouth 2 (two) times daily as needed.    Review of Systems  Constitutional:  Negative for chills, fatigue (notes more fatigue as she has entered 90th decade. just not having as much energy; but does feel better with current medications) and fever.  Respiratory:  Negative for cough, chest tightness, shortness of breath and wheezing.   Cardiovascular:  Negative for chest pain, palpitations and leg swelling.  Gastrointestinal:        Bowels are moving regularly with miralax; she doesn't have any negative effects with this.   Neurological:  Negative for dizziness, light-headedness and headaches.   Objective:  BP 140/80 Comment: vitals performed by health coach--jaf  Pulse 60   Temp 98.1 F (36.7 C)   Wt 147 lb (66.7 kg)   BMI 23.73 kg/m   Weight: 147 lb (66.7 kg)   BP Readings from Last 3 Encounters:  05/25/21 140/80  05/25/21 140/80  04/20/21 (!) 152/52   Wt Readings from Last 3 Encounters:  05/25/21 147 lb (66.7 kg)  05/25/21 147 lb (66.7 kg)  04/20/21 145 lb (65.8 kg)     Physical Exam Constitutional:      General: She is not in acute distress.    Appearance: She is well-developed.  Cardiovascular:     Rate and Rhythm: Normal rate and regular rhythm.     Heart sounds: Normal heart sounds. No murmur heard.   No friction rub.  Pulmonary:     Effort: Pulmonary effort is normal. No respiratory distress.     Breath sounds: Normal breath sounds. No wheezing or rales.  Musculoskeletal:     Right lower leg: No edema.     Left lower leg: No edema.  Neurological:     Mental Status: She is alert and oriented to person, place, and time.  Psychiatric:        Behavior: Behavior normal.    Assessment/Plan  1. Hypertension,  unspecified type Blood pressure initially was one of better readings we have had for her. My recheck was much higher and this matches her home cuff, which is reassuring that her home blood pressure readings are accurate. I do not want to overtreat her, because she will not feel well. This is the best she has felt in awhile, and the best control she has noted at home with bp. We will continue with current medications.   2. Postablative hypothyroidism She restarted synthroid daily after being in hospital. Plan to recheck at next visit.   3. Chronic kidney disease, stage 3a (HCC) Has been stable. Will recheck bloodwork at next visit.   4. Need for prophylactic vaccination against Streptococcus pneumoniae (pneumococcus) - Pneumococcal conjugate vaccine 20-valent (Prevnar 20)   Return in about 3 months (around 08/25/2021) for Chronic condition visit.  She had bone density in 2011 and in 2017. I cannot see results in scanned media. We will try to get these from [facility].      Theodis Shove, MD

## 2021-05-25 NOTE — Patient Instructions (Signed)
Linda Lucero , Thank you for taking time to come for your Medicare Wellness Visit. I appreciate your ongoing commitment to your health goals. Please review the following plan we discussed and let me know if I can assist you in the future.   Screening recommendations/referrals: Colonoscopy: no longer required  Mammogram: no longer required  Bone Density: referral 05/25/2021 Recommended yearly ophthalmology/optometry visit for glaucoma screening and checkup Recommended yearly dental visit for hygiene and checkup  Vaccinations: Influenza vaccine: due in fall 2022  Pneumococcal vaccine: due 2nd dose  Tdap vaccine: due with injury  Shingles vaccine: declined     Advanced directives: will provide copies   Conditions/risks identified: none   Next appointment: 05/25/2021  300pm  Dr.Koberlein    Preventive Care 65 Years and Older, Female Preventive care refers to lifestyle choices and visits with your health care provider that can promote health and wellness. What does preventive care include? A yearly physical exam. This is also called an annual well check. Dental exams once or twice a year. Routine eye exams. Ask your health care provider how often you should have your eyes checked. Personal lifestyle choices, including: Daily care of your teeth and gums. Regular physical activity. Eating a healthy diet. Avoiding tobacco and drug use. Limiting alcohol use. Practicing safe sex. Taking low-dose aspirin every day. Taking vitamin and mineral supplements as recommended by your health care provider. What happens during an annual well check? The services and screenings done by your health care provider during your annual well check will depend on your age, overall health, lifestyle risk factors, and family history of disease. Counseling  Your health care provider may ask you questions about your: Alcohol use. Tobacco use. Drug use. Emotional well-being. Home and relationship  well-being. Sexual activity. Eating habits. History of falls. Memory and ability to understand (cognition). Work and work Astronomer. Reproductive health. Screening  You may have the following tests or measurements: Height, weight, and BMI. Blood pressure. Lipid and cholesterol levels. These may be checked every 5 years, or more frequently if you are over 39 years old. Skin check. Lung cancer screening. You may have this screening every year starting at age 15 if you have a 30-pack-year history of smoking and currently smoke or have quit within the past 15 years. Fecal occult blood test (FOBT) of the stool. You may have this test every year starting at age 44. Flexible sigmoidoscopy or colonoscopy. You may have a sigmoidoscopy every 5 years or a colonoscopy every 10 years starting at age 5. Hepatitis C blood test. Hepatitis B blood test. Sexually transmitted disease (STD) testing. Diabetes screening. This is done by checking your blood sugar (glucose) after you have not eaten for a while (fasting). You may have this done every 1-3 years. Bone density scan. This is done to screen for osteoporosis. You may have this done starting at age 62. Mammogram. This may be done every 1-2 years. Talk to your health care provider about how often you should have regular mammograms. Talk with your health care provider about your test results, treatment options, and if necessary, the need for more tests. Vaccines  Your health care provider may recommend certain vaccines, such as: Influenza vaccine. This is recommended every year. Tetanus, diphtheria, and acellular pertussis (Tdap, Td) vaccine. You may need a Td booster every 10 years. Zoster vaccine. You may need this after age 41. Pneumococcal 13-valent conjugate (PCV13) vaccine. One dose is recommended after age 58. Pneumococcal polysaccharide (PPSV23) vaccine. One dose is  recommended after age 77. Talk to your health care provider about which  screenings and vaccines you need and how often you need them. This information is not intended to replace advice given to you by your health care provider. Make sure you discuss any questions you have with your health care provider. Document Released: 11/19/2015 Document Revised: 07/12/2016 Document Reviewed: 08/24/2015 Elsevier Interactive Patient Education  2017 [place] Prevention in the Home Falls can cause injuries. They can happen to people of all ages. There are many things you can do to make your home safe and to help prevent falls. What can I do on the outside of my home? Regularly fix the edges of walkways and driveways and fix any cracks. Remove anything that might make you trip as you walk through a door, such as a raised step or threshold. Trim any bushes or trees on the path to your home. Use bright outdoor lighting. Clear any walking paths of anything that might make someone trip, such as rocks or tools. Regularly check to see if handrails are loose or broken. Make sure that both sides of any steps have handrails. Any raised decks and porches should have guardrails on the edges. Have any leaves, snow, or ice cleared regularly. Use sand or salt on walking paths during winter. Clean up any spills in your garage right away. This includes oil or grease spills. What can I do in the bathroom? Use night lights. Install grab bars by the toilet and in the tub and shower. Do not use towel bars as grab bars. Use non-skid mats or decals in the tub or shower. If you need to sit down in the shower, use a plastic, non-slip stool. Keep the floor dry. Clean up any water that spills on the floor as soon as it happens. Remove soap buildup in the tub or shower regularly. Attach bath mats securely with double-sided non-slip rug tape. Do not have throw rugs and other things on the floor that can make you trip. What can I do in the bedroom? Use night lights. Make sure that you have a  light by your bed that is easy to reach. Do not use any sheets or blankets that are too big for your bed. They should not hang down onto the floor. Have a firm chair that has side arms. You can use this for support while you get dressed. Do not have throw rugs and other things on the floor that can make you trip. What can I do in the kitchen? Clean up any spills right away. Avoid walking on wet floors. Keep items that you use a lot in easy-to-reach places. If you need to reach something above you, use a strong step stool that has a grab bar. Keep electrical cords out of the way. Do not use floor polish or wax that makes floors slippery. If you must use wax, use non-skid floor wax. Do not have throw rugs and other things on the floor that can make you trip. What can I do with my stairs? Do not leave any items on the stairs. Make sure that there are handrails on both sides of the stairs and use them. Fix handrails that are broken or loose. Make sure that handrails are as long as the stairways. Check any carpeting to make sure that it is firmly attached to the stairs. Fix any carpet that is loose or worn. Avoid having throw rugs at the top or bottom of the stairs. If  you do have throw rugs, attach them to the floor with carpet tape. Make sure that you have a light switch at the top of the stairs and the bottom of the stairs. If you do not have them, ask someone to add them for you. What else can I do to help prevent falls? Wear shoes that: Do not have high heels. Have rubber bottoms. Are comfortable and fit you well. Are closed at the toe. Do not wear sandals. If you use a stepladder: Make sure that it is fully opened. Do not climb a closed stepladder. Make sure that both sides of the stepladder are locked into place. Ask someone to hold it for you, if possible. Clearly mark and make sure that you can see: Any grab bars or handrails. First and last steps. Where the edge of each step  is. Use tools that help you move around (mobility aids) if they are needed. These include: Canes. Walkers. Scooters. Crutches. Turn on the lights when you go into a dark area. Replace any light bulbs as soon as they burn out. Set up your furniture so you have a clear path. Avoid moving your furniture around. If any of your floors are uneven, fix them. If there are any pets around you, be aware of where they are. Review your medicines with your doctor. Some medicines can make you feel dizzy. This can increase your chance of falling. Ask your doctor what other things that you can do to help prevent falls. This information is not intended to replace advice given to you by your health care provider. Make sure you discuss any questions you have with your health care provider. Document Released: 08/19/2009 Document Revised: 03/30/2016 Document Reviewed: 11/27/2014 Elsevier Interactive Patient Education  2017 [employer].

## 2021-05-25 NOTE — Progress Notes (Signed)
Subjective:   Linda MccreedyDorothy M Lucero is a 87 y.o. female who presents for Medicare Annual (Subsequent) preventive examination.  Review of Systems    N/a        Objective:    There were no vitals filed for this visit. There is no height or weight on file to calculate BMI.  Advanced Directives 03/24/2021 05/25/2020 05/08/2020 12/26/2018 12/25/2018  Does Patient Have a Medical Advance Directive? No No No No No  Would patient like information on creating a medical advance directive? - Yes (MAU/Ambulatory/Procedural Areas - Information given) No - Patient declined Yes (MAU/Ambulatory/Procedural Areas - Information given) Yes (MAU/Ambulatory/Procedural Areas - Information given)    Current Medications (verified) Outpatient Encounter Medications as of 05/25/2021  Medication Sig   Blood Pressure Monitoring (COMFORT TOUCH BP CUFF/LARGE) MISC 1 Device by Does not apply route daily as needed.   cloNIDine (CATAPRES - DOSED IN MG/24 HR) 0.1 mg/24hr patch Place 1 patch (0.1 mg total) onto the skin once a week. (Patient not taking: Reported on 05/13/2021)   cloNIDine (CATAPRES) 0.1 MG tablet Take 0.1 mg by mouth 2 (two) times daily.   feeding supplement (ENSURE ENLIVE / ENSURE PLUS) LIQD Take 237 mLs by mouth 2 (two) times daily between meals.   furosemide (LASIX) 40 MG tablet Take 0.5 tablets (20 mg total) by mouth daily as needed for fluid or edema (3lbs weight gain in 24 hours).   hydrALAZINE (APRESOLINE) 25 MG tablet Take 1 tablet (25 mg total) by mouth 3 (three) times daily.   lisinopril (ZESTRIL) 10 MG tablet Take 1 tablet (10 mg total) by mouth daily. (Patient not taking: Reported on 05/13/2021)   polyethylene glycol powder (GLYCOLAX/MIRALAX) 17 GM/SCOOP powder Take 17 g by mouth 2 (two) times daily as needed.   No facility-administered encounter medications on file as of 05/25/2021.    Allergies (verified) Diovan [valsartan] and Doxazosin   History: Past Medical History:  Diagnosis Date   Coronary  artery disease    Hyperlipidemia    Hypertension    Renal artery stenosis (HCC)    Sleep apnea    Subclavian arterial stenosis (HCC)    Systolic murmur    Thyroid disease    Ventricular hypertrophy    Past Surgical History:  Procedure Laterality Date   CHOLECYSTECTOMY     Family History  Problem Relation Age of Onset   Heart disease Mother    High blood pressure Mother    Lung cancer Father    Diabetes Maternal Grandmother    Heart disease Maternal Grandmother    Social History   Socioeconomic History   Marital status: Widowed    Spouse name: Not on file   Number of children: 4   Years of education: Not on file   Highest education level: Not on file  Occupational History   Occupation: retired    Comment: retired  Tobacco Use   Smoking status: Former    Types: Cigarettes   Smokeless tobacco: Never  Substance and Sexual Activity   Alcohol use: Not on file   Drug use: Not on file   Sexual activity: Not on file  Other Topics Concern   Not on file  Social History Narrative   Lives with daughter in 2 level home; has 1 son in [place], and 2 other children who lives outside of [place].   Social Determinants of Health   Financial Resource Strain: Low Risk    Difficulty of Paying Living Expenses: Not hard at all  Food  Insecurity: No Food Insecurity   Worried About Programme researcher, broadcasting/film/video in the Last Year: Never true   Ran Out of Food in the Last Year: Never true  Transportation Needs: No Transportation Needs   Lack of Transportation (Medical): No   Lack of Transportation (Non-Medical): No  Physical Activity: Inactive   Days of Exercise per Week: 0 days   Minutes of Exercise per Session: 0 min  Stress: No Stress Concern Present   Feeling of Stress : Only a little  Social Connections: Moderately Isolated   Frequency of Communication with Friends and Family: More than three times a week   Frequency of Social Gatherings with Friends and Family: Once a week   Attends Religious  Services: More than 4 times per year   Active Member of [employer] or Organizations: No   Attends Banker Meetings: Never   Marital Status: Widowed    Tobacco Counseling Counseling given: Not Answered   Clinical Intake:                 Diabetic?no         Activities of Daily Living In your present state of health, do you have any difficulty performing the following activities: 05/25/2020  Hearing? Y  Comment mild hearing loss  Vision? Y  Comment blurred vision at times  Difficulty concentrating or making decisions? N  Walking or climbing stairs? N  Dressing or bathing? N  Doing errands, shopping? N  Preparing Food and eating ? N  Using the Toilet? N  In the past six months, have you accidently leaked urine? N  Do you have problems with loss of bowel control? N  Managing your Medications? N  Managing your Finances? N  Housekeeping or managing your Housekeeping? N  Some recent data might be hidden    Patient Care Team: Wynn Banker, MD as PCP - General (Family Medicine) Verner Chol, [facility] as Pharmacist (Pharmacist)  Indicate any recent Medical Services you may have received from other than [facility] providers in the past year (date may be approximate).     Assessment:   This is a routine wellness examination for [name].  Hearing/Vision screen No results found.  Dietary issues and exercise activities discussed:     Goals Addressed   None    Depression Screen PHQ 2/9 Scores 05/25/2020 05/14/2020 12/25/2018  PHQ - 2 Score 0 4 2  PHQ- 9 Score - - 6    Fall Risk Fall Risk  05/25/2020 12/25/2018  Falls in the past year? 0 0  Number falls in past yr: 0 -  Injury with Fall? 0 -  Risk for fall due to : Impaired mobility;Impaired balance/gait;Impaired vision Impaired balance/gait;Impaired mobility;Impaired vision  Follow up Falls prevention discussed Falls prevention discussed    FALL RISK PREVENTION PERTAINING TO THE HOME:  Any stairs  in or around the home? Yes  If so, are there any without handrails? No  Home free of loose throw rugs in walkways, pet beds, electrical cords, etc? Yes  Adequate lighting in your home to reduce risk of falls? Yes   ASSISTIVE DEVICES UTILIZED TO PREVENT FALLS:  Life alert? No  Use of a cane, walker or w/c? Yes  Grab bars in the bathroom? No  Shower chair or bench in shower? No  Elevated toilet seat or a handicapped toilet? No   TIMED UP AND GO:  Was the test performed? Yes .  Length of time to ambulate 10 feet: 10 sec.  Gait steady and fast without use of assistive device  Cognitive Function:     6CIT Screen 05/25/2020  What Year? 0 points  What month? 0 points  What time? 0 points  Count back from 20 0 points  Months in reverse 0 points  Repeat phrase 4 points  Total Score 4    Immunizations Immunization History  Administered Date(s) Administered   Fluad Quad(high Dose 65+) 10/27/2020   Influenza, High Dose Seasonal PF 09/04/2018, 08/01/2019   Influenza-Unspecified 08/01/2019   PFIZER(Purple Top)SARS-COV-2 Vaccination 01/11/2020, 02/10/2020, 08/17/2020, 02/26/2021   Pneumococcal Conjugate-13 10/02/2016   Zoster, Live 10/02/2016    TDAP status: Due, Education has been provided regarding the importance of this vaccine. Advised may receive this vaccine at local pharmacy or Health Dept. Aware to provide a copy of the vaccination record if obtained from local pharmacy or Health Dept. Verbalized acceptance and understanding.  Flu Vaccine status: Up to date  Pneumococcal vaccine status: Due, Education has been provided regarding the importance of this vaccine. Advised may receive this vaccine at local pharmacy or Health Dept. Aware to provide a copy of the vaccination record if obtained from local pharmacy or Health Dept. Verbalized acceptance and understanding.  Covid-19 vaccine status: Completed vaccines  Qualifies for Shingles Vaccine? Yes   Zostavax completed No    Shingrix Completed?: No.    Education has been provided regarding the importance of this vaccine. Patient has been advised to call insurance company to determine out of pocket expense if they have not yet received this vaccine. Advised may also receive vaccine at local pharmacy or Health Dept. Verbalized acceptance and understanding.  Screening Tests Health Maintenance  Topic Date Due   TETANUS/TDAP  Never done   Zoster Vaccines- Shingrix (1 of 2) Never done   DEXA SCAN  Never done   PNA vac Low Risk Adult (2 of 2 - PPSV23) 10/02/2017   INFLUENZA VACCINE  06/06/2021   COVID-19 Vaccine (5 - Booster for Pfizer series) 06/28/2021   HPV VACCINES  Aged Out    Health Maintenance  Health Maintenance Due  Topic Date Due   TETANUS/TDAP  Never done   Zoster Vaccines- Shingrix (1 of 2) Never done   DEXA SCAN  Never done   PNA vac Low Risk Adult (2 of 2 - PPSV23) 10/02/2017    Colorectal cancer screening: No longer required.   Mammogram status: No longer required due to age.  Bone Density status: Ordered 05/25/2021. Pt provided with contact info and advised to call to schedule appt.  Lung Cancer Screening: (Low Dose CT Chest recommended if Age 71-80 years, 30 pack-year currently smoking OR have quit w/in 15years.) does not qualify.   Lung Cancer Screening Referral: n/a  Additional Screening:  Hepatitis C Screening: does not qualify  Vision Screening: Recommended annual ophthalmology exams for early detection of glaucoma and other disorders of the eye. Is the patient up to date with their annual eye exam?  Yes  Who is the provider or what is the name of the office in which the patient attends annual eye exams? Dr.Seker  If pt is not established with a provider, would they like to be referred to a provider to establish care? No .   Dental Screening: Recommended annual dental exams for proper oral hygiene  Community Resource Referral / Chronic Care Management: CRR required this  visit?  No   CCM required this visit?  No      Plan:     I have  personally reviewed and noted the following in the patient's chart:   Medical and social history Use of alcohol, tobacco or illicit drugs  Current medications and supplements including opioid prescriptions.  Functional ability and status Nutritional status Physical activity Advanced directives List of other physicians Hospitalizations, surgeries, and ER visits in previous 12 months Vitals Screenings to include cognitive, depression, and falls Referrals and appointments  In addition, I have reviewed and discussed with patient certain preventive protocols, quality metrics, and best practice recommendations. A written personalized care plan for preventive services as well as general preventive health recommendations were provided to patient.     March Rummage, LPN   [id number]/61/[id number]   Nurse Notes: none

## 2021-05-30 ENCOUNTER — Telehealth: Payer: Self-pay | Admitting: *Deleted

## 2021-05-30 NOTE — Telephone Encounter (Signed)
Spoke with Linda Lucero and she stated the reports will be faxed.  Results were received and given to PCP.

## 2021-05-30 NOTE — Telephone Encounter (Signed)
-----   Message from Wynn Banker, MD sent at 05/26/2021 12:47 PM EDT ----- After re-reviewing 77 pages of [facility] scanned results; it looks like she had dexa in 2011 and 2017. I do not have those results in our system though and they were not commented on in note. Can we call to see if we can get the actual dexa report [phone] and please let patient know to hold off on scheduling another dexa until we review this. Thanks!

## 2021-06-01 NOTE — Telephone Encounter (Signed)
Last DEXA was 09/2016 and showed osteopenia. Would not recommend she repeat based on our discussion and prior DEXA.

## 2021-06-02 NOTE — Telephone Encounter (Signed)
Patient informed of the message below.

## 2021-06-18 ENCOUNTER — Other Ambulatory Visit: Payer: Self-pay | Admitting: Family Medicine

## 2021-06-18 DIAGNOSIS — E2839 Other primary ovarian failure: Secondary | ICD-10-CM

## 2021-06-30 ENCOUNTER — Telehealth: Payer: Self-pay | Admitting: Pharmacist

## 2021-06-30 NOTE — Chronic Care Management (AMB) (Signed)
Chronic Care Management Pharmacy Assistant   Name: Linda Lucero  MRN: [id number] DOB: [birth year]  Reason for Encounter: Disease State/ Hypertension Assessment Call.    Conditions to be addressed/monitored: HTN   Recent office visits:  05/25/21 Theodis Shove MD (PCP) - seen for hypertension and other issues. Discontinued clonidine 0.1mg . follow up in 3 months.  Recent consult visits:  None.  Hospital visits:  None in previous 6 months  Medications: Outpatient Encounter Medications as of 06/30/2021  Medication Sig   Blood Pressure Monitoring (COMFORT TOUCH BP CUFF/LARGE) MISC 1 Device by Does not apply route daily as needed.   cloNIDine (CATAPRES) 0.1 MG tablet Take 0.1 mg by mouth 2 (two) times daily.   feeding supplement (ENSURE ENLIVE / ENSURE PLUS) LIQD Take 237 mLs by mouth 2 (two) times daily between meals.   furosemide (LASIX) 40 MG tablet Take 0.5 tablets (20 mg total) by mouth daily as needed for fluid or edema (3lbs weight gain in 24 hours).   hydrALAZINE (APRESOLINE) 25 MG tablet Take 1 tablet (25 mg total) by mouth 3 (three) times daily.   levothyroxine (SYNTHROID) 75 MCG tablet Take 75 mcg by mouth daily.   lisinopril (ZESTRIL) 10 MG tablet Take 1 tablet (10 mg total) by mouth daily.   polyethylene glycol powder (GLYCOLAX/MIRALAX) 17 GM/SCOOP powder Take 17 g by mouth 2 (two) times daily as needed.   No facility-administered encounter medications on file as of 06/30/2021.   Fill History: FUROSEMIDE 40MG  TAB 06/23/2021 90   LEVOTHYROXINE 75 MCG TABLET 05/19/2021 90   LISINOPRIL 10 MG TABLET 04/05/2021 90   CLONIDINE HCL 0.1/24HR DIS 04/20/2021 84    CLONIDINE HCL 0.1 MG TABLET 04/07/2021 90   HYDRALAZINE 25 MG TABLET 04/20/2021 90   TRAZODONE HYDROCHLORIDE 50MG  TAB 02/26/2021 90   Reviewed chart prior to disease state call. Spoke with patient regarding BP  Recent Office Vitals: BP Readings from Last 3 Encounters:  05/25/21 140/80  05/25/21  140/80  04/20/21 (!) 152/52   Pulse Readings from Last 3 Encounters:  05/25/21 60  05/25/21 60  04/20/21 72    Wt Readings from Last 3 Encounters:  05/25/21 147 lb (66.7 kg)  05/25/21 147 lb (66.7 kg)  04/20/21 145 lb (65.8 kg)     Kidney Function Lab Results  Component Value Date/Time   CREATININE 1.56 (H) 04/20/2021 03:06 PM   CREATININE 1.49 (H) 03/29/2021 01:44 AM   CREATININE 1.49 (H) 08/20/2020 02:26 PM   CREATININE 1.98 (H) 08/11/2020 03:19 PM   GFR 29.06 (L) 04/20/2021 03:06 PM   GFRNONAA 33 (L) 03/29/2021 01:44 AM   GFRAA 40 (L) 05/07/2020 07:58 PM    BMP Latest Ref Rng & Units 04/20/2021 03/29/2021 03/28/2021  Glucose 70 - 99 mg/dL 03/31/2021) 99 92  BUN 6 - 23 mg/dL 03/30/2021) 852(D) 78(E)  Creatinine 0.40 - 1.20 mg/dL 42(P) 53(I) 1.44(R)  BUN/Creat Ratio 6 - 22 (calc) - - -  Sodium 135 - 145 mEq/L 135 134(L) 134(L)  Potassium 3.5 - 5.1 mEq/L 4.3 4.3 4.2  Chloride 96 - 112 mEq/L 102 101 102  CO2 19 - 32 mEq/L 23 28 23   Calcium 8.4 - 10.5 mg/dL 9.8 9.2 1.54(M)    Current antihypertensive regimen:  Lisinopril 10mg  - last filled on 04/05/21 90[facility] Hydralazine 25mg  - take 1 tablet 3 times daily. How often are you checking your Blood Pressure? infrequently. Patient is working on getting an arm blood pressure cuff in place of her  wrist cuff which she feels is not accurate.  Current home BP readings: running in the 160s for her systolic number and in the 80s for her bottom number What recent interventions/DTPs have been made by any provider to improve Blood Pressure control since last CPP Visit: None. Any recent hospitalizations or ED visits since last visit with CPP? No  Adherence Review: Is the patient currently on ACE/ARB medication? Yes Does the patient have >5 day gap between last estimated fill dates? No  Notes: Spoke with patient and reviewed her medications as listed. Patient reports taking all medications except she is no longer taking clonidine anymore due to  cost and stated her PCP is aware and stated it was okay to just continue her lisinopril and hydralazine. Patient stated her blood pressure has been running a little high on her systolic number lately but has no headaches or symptoms of hypertension. Patient is going to be getting a new blood pressure arm cuff and stated she does not trust the wrist cuffs readings. Patient stated for breakfast she tends to have some cream of wheat with some grapes and a cup of orange juice. For lunch she tends to have a bowl of mixed vegetables. Patient still cooks dinner and she cooks mostly only chicken and boils and shreds it to mix with vegetables or make soups because she cannot chew a lot of meats. Patient does not add salt to her food and does not eat red meat. Patient drinks about 3 tall glasses of water a day. Patient stated she goes up and down the stairs at home a couple times a day and she is able to navigate her home with no help. Patient is not able to do any strenuous activity due to her arthritis.  Patient thanked me for my call.  Care Gaps:  AWV - completed on 05/25/21 Zoster vaccines - never done Flu vaccine - due Covid-19 vaccine booster 5 - due  Star Rating Drugs:  Lisinopril 10mg  - last filled [facility]05/21 [facility]  04/07/21 CMA  Clinical Pharmacist Assistant [phone]

## 2021-07-03 ENCOUNTER — Other Ambulatory Visit: Payer: Self-pay | Admitting: Family Medicine

## 2021-08-10 ENCOUNTER — Other Ambulatory Visit: Payer: Self-pay | Admitting: Family Medicine

## 2021-09-02 ENCOUNTER — Ambulatory Visit (INDEPENDENT_AMBULATORY_CARE_PROVIDER_SITE_OTHER): Payer: Medicare HMO | Admitting: Family Medicine

## 2021-09-02 ENCOUNTER — Encounter: Payer: Self-pay | Admitting: Family Medicine

## 2021-09-02 ENCOUNTER — Other Ambulatory Visit: Payer: Self-pay

## 2021-09-02 VITALS — BP 140/60 | HR 65 | Temp 97.7°F | Ht 66.0 in | Wt 152.2 lb

## 2021-09-02 DIAGNOSIS — R42 Dizziness and giddiness: Secondary | ICD-10-CM

## 2021-09-02 DIAGNOSIS — I10 Essential (primary) hypertension: Secondary | ICD-10-CM | POA: Diagnosis not present

## 2021-09-02 DIAGNOSIS — B351 Tinea unguium: Secondary | ICD-10-CM | POA: Diagnosis not present

## 2021-09-02 DIAGNOSIS — R29898 Other symptoms and signs involving the musculoskeletal system: Secondary | ICD-10-CM

## 2021-09-02 DIAGNOSIS — M1710 Unilateral primary osteoarthritis, unspecified knee: Secondary | ICD-10-CM

## 2021-09-02 DIAGNOSIS — R7989 Other specified abnormal findings of blood chemistry: Secondary | ICD-10-CM | POA: Diagnosis not present

## 2021-09-02 DIAGNOSIS — E890 Postprocedural hypothyroidism: Secondary | ICD-10-CM | POA: Diagnosis not present

## 2021-09-02 DIAGNOSIS — E538 Deficiency of other specified B group vitamins: Secondary | ICD-10-CM | POA: Diagnosis not present

## 2021-09-02 DIAGNOSIS — Z23 Encounter for immunization: Secondary | ICD-10-CM | POA: Diagnosis not present

## 2021-09-02 NOTE — Progress Notes (Signed)
Linda Lucero DOB: Jan 27, 1928 Encounter date: 09/02/2021  This is a 87 y.o. female who presents with Chief Complaint  Patient presents with   Follow-up    History of present illness: Feeling a little dizzy today when she got up today, but feels better now. Not worse than it has been in the past, but also not better. Maybe every 10 days or so she feels that way. She is still getting communion at home and not going to mass; occasionally still has the sensation of falling forward. Different than dizziness in morning. BP: Doesn't check every morning because it depresses her. Usually in 160's in the morning, but can go up to 180's. Lower number can go up to 80's.   Since here last moved in with son. 2 weeks ago was in bathroom and had a hard time standing up from stool and fell into wall. Just couldn't control self and went into wall. House isn't set up for he - low toilets, no grab bars.   Can't get into position to cut toenails; would like to see podiatrist.   Hard to hold shower sprayer and has to hold to rinse out.   She isn't cooking, just quick things like cream of wheat/mixed vegetable pack. Eats shredded wheat cereal. Gets smoked sausage in mixed vegetables. Sometimes will put in frozen spinach. She has eaten this for awhile. Can't do extensive meal prep. Feels weak if she stands too long, so has done quick meal prep for awhile.   Notes that legs will swell if she is sitting too long.   Allergies  Allergen Reactions   Diovan [Valsartan]     Pt reports not allergic 03/25/21   Doxazosin     Pt reports not allergic 03/25/21   Current Meds  Medication Sig   Blood Pressure Monitoring (COMFORT TOUCH BP CUFF/LARGE) MISC 1 Device by Does not apply route daily as needed.   cloNIDine (CATAPRES) 0.1 MG tablet Take 0.1 mg by mouth 2 (two) times daily.   feeding supplement (ENSURE ENLIVE / ENSURE PLUS) LIQD Take 237 mLs by mouth 2 (two) times daily between meals.   furosemide (LASIX) 40 MG  tablet Take 0.5 tablets (20 mg total) by mouth daily as needed for fluid or edema (3lbs weight gain in 24 hours).   hydrALAZINE (APRESOLINE) 25 MG tablet Take 1 tablet (25 mg total) by mouth 3 (three) times daily.   levothyroxine (SYNTHROID) 75 MCG tablet TAKE 1 TABLET BY MOUTH EVERY DAY   lisinopril (ZESTRIL) 10 MG tablet Take 1 tablet (10 mg total) by mouth daily.   polyethylene glycol powder (GLYCOLAX/MIRALAX) 17 GM/SCOOP powder Take 17 g by mouth 2 (two) times daily as needed.    Review of Systems  Constitutional:  Negative for chills, fatigue and fever.  Respiratory:  Negative for cough, chest tightness, shortness of breath and wheezing.   Cardiovascular:  Negative for chest pain, palpitations and leg swelling.  Neurological:  Positive for dizziness (usually in morning; happens a few times/month) and weakness (legs feel a little weaker). Negative for headaches.   Objective:  BP (!) 180/68 (BP Location: Left Arm, Patient Position: Sitting, Cuff Size: Normal)   Pulse 65   Temp 97.7 F (36.5 C) (Oral)   Ht 5\' 6"  (1.676 m)   Wt 152 lb 3.2 oz (69 kg)   SpO2 99%   BMI 24.57 kg/m   Weight: 152 lb 3.2 oz (69 kg)   BP Readings from Last 3 Encounters:  09/02/21 (!) 180/68  05/25/21 140/80  05/25/21 140/80   Wt Readings from Last 3 Encounters:  09/02/21 152 lb 3.2 oz (69 kg)  05/25/21 147 lb (66.7 kg)  05/25/21 147 lb (66.7 kg)    Physical Exam Constitutional:      General: She is not in acute distress.    Appearance: She is well-developed.  Cardiovascular:     Rate and Rhythm: Normal rate and regular rhythm.     Heart sounds: Murmur heard.  Crescendo decrescendo systolic murmur is present with a grade of 2/6.    No friction rub.  Pulmonary:     Effort: Pulmonary effort is normal. No respiratory distress.     Breath sounds: Normal breath sounds. No wheezing, rhonchi or rales.  Musculoskeletal:     Right lower leg: No edema.     Left lower leg: No edema.  Neurological:      Mental Status: She is alert and oriented to person, place, and time.  Psychiatric:        Behavior: Behavior normal.    Assessment/Plan  1. Hypertension, unspecified type She feels better with current blood pressure treatment right now than she has with most treatments in the past.  She does still have large fluctuations in blood pressure.  She did not tolerate higher dose of lisinopril and has not tolerated other blood pressure medications in the past (amlodipine, clonidine, losartan, hydrochlorothiazide).  Generally, her blood pressures are within a fairly normal range, with a couple of outliers that are quite high.  She is always struggled with these outliers though and I worry that overtreatment will result in diastolic hypotension.  Continue current medications. - CBC with Differential/Platelet; Future - Comprehensive metabolic panel; Future  2. Weakness of both lower extremities She would benefit from some aids around the house to make it safer for her.  She is recently moved into her son's home.  We discussed toilet lift, shower accommodations.  There are also stairs in the home.  Ideally I would like her 72 to come out and assess the home to see what might work best for her helping her safely navigate the home. - AMB Referral to [facility] Coordinaton  3. Arthritis of knee See above.  4. Dizziness See above. - AMB Referral to [facility] Coordinaton  5. Elevated serum creatinine Recheck blood work next month.  6. Low serum vitamin B12 - Vitamin B12; Future - Folate; Future  7. Postablative hypothyroidism - TSH; Future  8. Onychomycosis - Ambulatory referral to Podiatry  9. Need for immunization against influenza - Flu Vaccine QUAD High Dose(Fluad)   Return for labs in november; follow up with me in 6 months.      Theodis Shove, MD

## 2021-09-05 ENCOUNTER — Telehealth: Payer: Self-pay | Admitting: *Deleted

## 2021-09-05 ENCOUNTER — Telehealth: Payer: Self-pay | Admitting: Family Medicine

## 2021-09-05 NOTE — Telephone Encounter (Signed)
Pt call and stated she is returning your call and will wait for a call back. °

## 2021-09-05 NOTE — Chronic Care Management (AMB) (Signed)
  Chronic Care Management   Note  09/05/2021 Name: Linda Lucero MRN: [id number] DOB: [birth year]  Linda Lucero is a 87 y.o. year old female who is a primary care patient of Wynn Banker, MD. Linda Lucero is currently enrolled in care management services. An additional referral for RNCM was placed.   Follow up plan: Unsuccessful telephone outreach attempt made. A HIPAA compliant phone message was left for the patient providing contact information and requesting a return call.  The care management team will reach out to the patient again over the next 7 days.  If patient returns call to provider office, please advise to call Embedded Care Management Care Guide Linda Lucero at [phone].  Linda Lucero  Care Guide, Embedded Care Coordination [facility] Management  Direct Dial: [phone]

## 2021-09-06 NOTE — Chronic Care Management (AMB) (Signed)
  Chronic Care Management   Outreach Note  09/06/2021 Name: ALAISA MOFFITT MRN: [id number] DOB: 07/14/1928  [name] is a 87 y.o. year old female who is a primary care patient of Koberlein, Paris Lore, MD. I reached out to [name] by phone today in response to a referral sent by Ms. Kelli Churn Kuiken's primary care provider.  A second unsuccessful telephone outreach was attempted today. The patient was referred to the case management team for assistance with care management and care coordination.   Follow Up Plan: A HIPAA compliant phone message was left for the patient providing contact information and requesting a return call.  The care management team will reach out to the patient again over the next 7 days.  If patient returns call to provider office, please advise to call Embedded Care Management Care Guide Misty Stanley* at [phone].Gwenevere Ghazi  Care Guide, Embedded Care Coordination [facility] Management  Direct Dial: [phone]

## 2021-09-12 ENCOUNTER — Other Ambulatory Visit: Payer: Self-pay | Admitting: Family Medicine

## 2021-09-12 DIAGNOSIS — I10 Essential (primary) hypertension: Secondary | ICD-10-CM

## 2021-09-12 DIAGNOSIS — N1831 Chronic kidney disease, stage 3a: Secondary | ICD-10-CM

## 2021-09-12 DIAGNOSIS — E890 Postprocedural hypothyroidism: Secondary | ICD-10-CM

## 2021-09-12 NOTE — Chronic Care Management (AMB) (Signed)
Dr. Hassan Rowan does not feel patient needs a call from [facility] closing referral. See referral message.   Linda Lucero  Care Guide, Embedded Care Coordination [facility] Management  Direct Dial: [phone]

## 2021-09-14 ENCOUNTER — Other Ambulatory Visit: Payer: Self-pay

## 2021-09-14 ENCOUNTER — Encounter: Payer: Self-pay | Admitting: Podiatry

## 2021-09-14 ENCOUNTER — Ambulatory Visit: Payer: Medicare HMO | Admitting: Podiatry

## 2021-09-14 DIAGNOSIS — B351 Tinea unguium: Secondary | ICD-10-CM

## 2021-09-14 DIAGNOSIS — M79674 Pain in right toe(s): Secondary | ICD-10-CM

## 2021-09-14 DIAGNOSIS — M79675 Pain in left toe(s): Secondary | ICD-10-CM | POA: Diagnosis not present

## 2021-09-14 NOTE — Progress Notes (Signed)
  Subjective:  Patient ID: Linda Lucero, female    DOB: 28-Apr-1928,  MRN: [id number]  Chief Complaint  Patient presents with   Nail Problem    Nail trim    87 y.o. female returns for the above complaint.  Patient presents with thickened elongated dystrophic toenails x10.  She would like for me to debride down she is not able to do her self.  She is not a diabetic.  She denies any other acute complaints.  Objective:  There were no vitals filed for this visit. Podiatric Exam: Vascular: dorsalis pedis and posterior tibial pulses are palpable bilateral. Capillary return is immediate. Temperature gradient is WNL. Skin turgor WNL  Sensorium: Normal Semmes Weinstein monofilament test. Normal tactile sensation bilaterally. Nail Exam: Pt has thick disfigured discolored nails with subungual debris noted bilateral entire nail hallux through fifth toenails.  Pain on palpation to the nails. Ulcer Exam: There is no evidence of ulcer or pre-ulcerative changes or infection. Orthopedic Exam: Muscle tone and strength are WNL. No limitations in general ROM. No crepitus or effusions noted.  Skin: No Porokeratosis. No infection or ulcers    Assessment & Plan:   1. Pain due to onychomycosis of toenails of both feet     Patient was evaluated and treated and all questions answered.  Onychomycosis with pain  -Nails palliatively debrided as below. -Educated on self-care  Procedure: Nail Debridement Rationale: pain  Type of Debridement: manual, sharp debridement. Instrumentation: Nail nipper, rotary burr. Number of Nails: 10  Procedures and Treatment: Consent by patient was obtained for treatment procedures. The patient understood the discussion of treatment and procedures well. All questions were answered thoroughly reviewed. Debridement of mycotic and hypertrophic toenails, 1 through 5 bilateral and clearing of subungual debris. No ulceration, no infection noted.  Return Visit-Office Procedure:  Patient instructed to return to the office for a follow up visit 3 months for continued evaluation and treatment.  Nicholes Rough, DPM    Return in about 3 months (around 12/15/2021).

## 2021-09-19 ENCOUNTER — Telehealth: Payer: Self-pay | Admitting: Pharmacist

## 2021-09-19 NOTE — Chronic Care Management (AMB) (Addendum)
    Chronic Care Management Pharmacy Assistant   Name: Linda Lucero  MRN: [id number] DOB: 1928-05-05  08/20/2021 APPOINTMENT REMINDER   Called [name], No answer, left message of appointment on 09/20/2021 at 1:00 via telephone visit with Gaylord Shih, Pharm D. Notified to have all medications, supplements, blood pressure and/or blood sugar logs available during appointment and to return call if need to reschedule.  Care Gaps: AWV - completed on 05/25/21 Zoster vaccines - never done Covid-19 vaccine - overdue Last BP - 140/60 on 09/02/2021  Star Rating Drug: Lisinopril 10mg  - last filled on [facility]04/[facility]21 [facility]  Any gaps in medications fill history? No  08/06/2021 [facility]  [facility] [phone]

## 2021-09-20 ENCOUNTER — Ambulatory Visit (INDEPENDENT_AMBULATORY_CARE_PROVIDER_SITE_OTHER): Payer: Medicare HMO | Admitting: Pharmacist

## 2021-09-20 DIAGNOSIS — I10 Essential (primary) hypertension: Secondary | ICD-10-CM

## 2021-09-20 DIAGNOSIS — N1831 Chronic kidney disease, stage 3a: Secondary | ICD-10-CM

## 2021-09-20 NOTE — Progress Notes (Signed)
Chronic Care Management Pharmacy Note  09/20/2021 Name:  Linda Lucero MRN:  [id number] DOB:  1928/06/25  Summary: Patient is feeling much better but not checking BP at home often  Recommendations/Changes made from today's visit: -Recommended purchasing Omron arm BP cuff -Recommended continued daily BP monitoring at home and bringing her new wrist cuff to office visit with PCP to compare readings  Plan: Follow up once patient purchases new arm BP cuff  Subjective: Linda Lucero is an 87 y.o. year old female who is a primary patient of Koberlein, Steele Berg, MD.  The CCM team was consulted for assistance with disease management and care coordination needs.    Engaged with patient face to face for follow up visit in response to provider referral for pharmacy case management and/or care coordination services.   Consent to Services:  The patient was given information about Chronic Care Management services, agreed to services, and gave verbal consent prior to initiation of services.  Please see initial visit note for detailed documentation.   Patient Care Team: Caren Macadam, MD as PCP - General (Family Medicine) Viona Gilmore, [facility] as Pharmacist (Pharmacist)  Recent office visits: 09/02/21 Micheline Rough, MD: Patient presented for BP follow up. Influenza vaccine administered. Referral placed to podiatry. Return for labs in November.  05/25/21 Micheline Rough MD (PCP) - seen for hypertension and other issues. Discontinued clonidine 0.40m. follow up in 3 months.  04/20/21 JMicheline Rough MD: Patient presented for hospitalization follow up. Prescribed clonidine patch. Decreased hydralazine to 25 mg TID. Decreased lisinopril to 10 mg daily.  Recent consult visits: 09/14/21 KBoneta Lucks DPM (podiatry): Patient presented for nail trim.  5/19-5/24/22 Patient admitted for HF exacerbation. D/c'd levothyroxine, trazodone, vitamin D. Continued amlodipine 10 mg daily, clonidine  0.1 mg BID, furosemide PRN, hydralazine 50 mg TID, and lisinopril 20 mg daily.  05/07/20: Patient presented to ED for hypertensive urgency. Patient only taking hydralazine twice daily instead of TID.   Hospital visits: None in previous 6 months  Objective:  Lab Results  Component Value Date   CREATININE 1.56 (H) 04/20/2021   BUN 27 (H) 04/20/2021   GFR 29.06 (L) 04/20/2021   GFRNONAA 33 (L) 03/29/2021   GFRAA 40 (L) 05/07/2020   NA 135 04/20/2021   K 4.3 04/20/2021   CALCIUM 9.8 04/20/2021   CO2 23 04/20/2021    Lab Results  Component Value Date/Time   GFR 29.06 (L) 04/20/2021 03:06 PM   GFR 49.71 (L) 12/19/2019 02:04 PM    Last diabetic Eye exam: No results found for: HMDIABEYEEXA  Last diabetic Foot exam: No results found for: HMDIABFOOTEX   Lab Results  Component Value Date   CHOL 222 (H) 05/14/2020   HDL 93 05/14/2020   LDLCALC 113 (H) 05/14/2020   TRIG 75 05/14/2020   CHOLHDL 2.4 05/14/2020    Hepatic Function Latest Ref Rng & Units 04/20/2021 03/24/2021 05/14/2020  Total Protein 6.0 - 8.3 g/dL 7.6 7.5 7.1  Albumin 3.5 - 5.2 g/dL 4.1 3.6 -  AST 0 - 37 U/L 17 20 18   ALT 0 - 35 U/L 9 11 8   Alk Phosphatase 39 - 117 U/L 67 61 -  Total Bilirubin 0.2 - 1.2 mg/dL 0.4 0.7 0.4    Lab Results  Component Value Date/Time   TSH 5.46 (H) 04/20/2021 03:06 PM   TSH 6.942 (H) 03/26/2021 12:14 AM   TSH 4.35 10/27/2020 04:13 PM   FREET4 1.2 10/27/2020 04:13 PM   FREET4 0.71  12/25/2018 03:16 PM    CBC Latest Ref Rng & Units 04/20/2021 03/28/2021 03/27/2021  WBC 4.0 - 10.5 K/uL 7.0 6.8 6.2  Hemoglobin 12.0 - 15.0 g/dL 10.1(L) 8.4(L) 8.5(L)  Hematocrit 36.0 - 46.0 % 30.5(L) 25.7(L) 25.2(L)  Platelets 150.0 - 400.0 K/uL 300.0 233 238    No results found for: VD25OH  Clinical ASCVD: No  The ASCVD Risk score (Arnett DK, et al., 2019) failed to calculate for the following reasons:   The 2019 ASCVD risk score is only valid for ages 33 to 43    Depression screen PHQ 2/9  05/25/2021 05/25/2020 05/14/2020  Decreased Interest 0 0 1  Down, Depressed, Hopeless 0 0 3  PHQ - 2 Score 0 0 4  Altered sleeping - - -  Tired, decreased energy - - -  Change in appetite - - -  Feeling bad or failure about yourself  - - -  Trouble concentrating - - -  Moving slowly or fidgety/restless - - -  Suicidal thoughts - - -  PHQ-9 Score - - -      Social History   Tobacco Use  Smoking Status Former   Types: Cigarettes  Smokeless Tobacco Never   BP Readings from Last 3 Encounters:  09/02/21 140/60  05/25/21 140/80  05/25/21 140/80   Pulse Readings from Last 3 Encounters:  09/02/21 65  05/25/21 60  05/25/21 60   Wt Readings from Last 3 Encounters:  09/02/21 152 lb 3.2 oz (69 kg)  05/25/21 147 lb (66.7 kg)  05/25/21 147 lb (66.7 kg)    Assessment/Interventions: Review of patient past medical history, allergies, medications, health status, including review of consultants reports, laboratory and other test data, was performed as part of comprehensive evaluation and provision of chronic care management services.   SDOH:  (Social Determinants of Health) assessments and interventions performed: No   CCM Care Plan  Allergies  Allergen Reactions   Diovan [Valsartan]     Pt reports not allergic 03/25/21   Doxazosin     Pt reports not allergic 03/25/21    Medications Reviewed Today     Reviewed by Felipa Furnace, DPM (Physician) on 09/14/21 at 1416  Med List Status: <None>   Medication Order Taking? Sig Documenting Provider Last Dose Status Informant  Blood Pressure Monitoring (COMFORT TOUCH BP CUFF/LARGE) MISC [id number] No 1 Device by Does not apply route daily as needed. Caren Macadam, MD Taking Active   feeding supplement (ENSURE ENLIVE / ENSURE PLUS) LIQD [id number] No Take 237 mLs by mouth 2 (two) times daily between meals. Pokhrel, Corrie Mckusick, MD Taking Active   furosemide (LASIX) 40 MG tablet [id number] No Take 0.5 tablets (20 mg total) by mouth daily as  needed for fluid or edema (3lbs weight gain in 24 hours). Pokhrel, Laxman, MD Taking Active   hydrALAZINE (APRESOLINE) 25 MG tablet [id number] No Take 1 tablet (25 mg total) by mouth 3 (three) times daily. Caren Macadam, MD Taking Active   levothyroxine (SYNTHROID) 75 MCG tablet [id number] No TAKE 1 TABLET BY MOUTH EVERY DAY Koberlein, Junell C, MD Taking Active   lisinopril (ZESTRIL) 10 MG tablet [id number] No Take 1 tablet (10 mg total) by mouth daily. Caren Macadam, MD Taking Active   polyethylene glycol powder (GLYCOLAX/MIRALAX) 17 GM/SCOOP powder [id number] No Take 17 g by mouth 2 (two) times daily as needed. Caren Macadam, MD Taking Active             Patient Active Problem  List   Diagnosis Date Noted   Malnutrition of moderate degree 03/28/2021   CHF (congestive heart failure) ([place]) 03/25/2021   Acute on chronic diastolic CHF (congestive heart failure) (HCC)    Elevated troponin    Acute CHF ([place]) 03/24/2021   Coronary artery disease    Low back pain 09/06/2018   Hypertension 09/06/2018   Chronic kidney disease, stage 3a ([place]) 09/06/2018   Hypothyroidism 09/06/2018    Immunization History  Administered Date(s) Administered   Fluad Quad(high Dose 65+) 10/27/2020, 09/02/2021   Influenza, High Dose Seasonal PF 09/04/2018, 08/01/2019   Influenza-Unspecified 08/01/2019   PFIZER(Purple Top)SARS-COV-2 Vaccination 01/11/2020, 02/10/2020, 08/17/2020, 02/26/2021   PNEUMOCOCCAL CONJUGATE-20 05/25/2021   Pneumococcal Conjugate-13 10/24/2017   Tdap 10/24/2017   Zoster, Live 10/02/2016   Patient still hasn't gotten an arm cuff. She is using the wrist cuff and it's usually in the 160/80s. She feels discouraged when she sees numbers in this range and doesn't want to check as often.  Conditions to be addressed/monitored:  Hypertension, Chronic Kidney Disease, Hypothyroidism, Osteoarthritis and Insomnia  Conditions addressed this visit: Hypertension, CKD  Care Plan :  CCM Pharmacy Care Plan  Updates made by Viona Gilmore, [place] since 09/20/2021 12:00 AM     Problem: Problem: Hypertension, Chronic Kidney Disease, Hypothyroidism, Osteoarthritis and Insomnia      Long-Range Goal: Patient-Specific Goal   Start Date: 12/30/2020  Expected End Date: 12/30/2021  Recent Progress: On track  Priority: High  Note:   Current Barriers:  Unable to independently monitor therapeutic efficacy Unable to achieve control of blood pressure   Pharmacist Clinical Goal(s):  Patient will achieve control of hypertension as evidenced by blood pressure readings  through collaboration with PharmD and provider.   Interventions: 1:1 collaboration with Caren Macadam, MD regarding development and update of comprehensive plan of care as evidenced by provider attestation and co-signature Inter-disciplinary care team collaboration (see longitudinal plan of care) Comprehensive medication review performed; medication list updated in electronic medical record  Hypertension (BP goal <140/90) -Uncontrolled -Current treatment: Hydralazine 25 mg three times daily Lisinopril 10 mg 1 tablet once daily -Medications previously tried: HCTZ (side effects), clonidine, losartan (unknown), valsartan -Current home readings:  162/84, 160/80s (wrist cuff - off in office from arm cuff) -Current dietary habits: doesn't eat a lot of salt when cooking and does not eat out; doesn't eat processed foods and drinks 5 glasses of water per day -Current exercise habits: does not sit down all day but no structured exercise; trying to walk around her yard more -Denies hypotensive/hypertensive symptoms -Educated on Importance of home blood pressure monitoring; Proper BP monitoring technique; Symptoms of hypotension and importance of maintaining adequate hydration; -Counseled to monitor BP at home daily document, and provide log at future appointments -Recommended to continue current  medication Recommended purchasing Omron BP arm cuff to start checking BP at home again.  Insomnia (Goal: improve quality and quantity of sleep) -Controlled -Current treatment  No medications -Medications previously tried: trazodone -Recommended to continue current medication Counseled on practicing good sleep hygiene by setting a sleep schedule and maintaining it, avoid excessive napping, following a nightly routine, avoiding screen time for 30-60 minutes before going to bed, and making the bedroom a cool, quiet and dark space  Hypothyroidism (Goal: TSH 0.35-4.5) -Controlled -Current treatment  No medications -Medications previously tried: levothyroxine -Recommended to continue current medication   Osteoarthritis (Goal: minimize pain associated with arthritis) -Controlled -Current treatment  Diclofenac 1% gel apply as needed Aspirin 81  mg daily   Tylenol as needed -Medications previously tried: none  -Counseled on risk of bleeding with aspirin and kidney damage Recommended use of Tylenol more often than aspirin   CKD (Goal: prevent further worsening of kidneys) -Controlled -Current treatment  Lisinopril 10 mg daily  -Medications previously tried: losartan, valsartan  -Counseled on avoidance of NSAIDs for worsening kidney function   Health Maintenance -Vaccine gaps: tetanus -Current therapy:  Zinc 50 mg daily  Vitamin D 1000 units daily  Multivitamin daily  -Educated on Cost vs benefit of each product must be carefully weighed by individual consumer -Patient is satisfied with current therapy and denies issues -Recommended to continue current medication  Patient Goals/Self-Care Activities Patient will:  - take medications as prescribed check blood pressure twice weekly, document, and provide at future appointments  Follow Up Plan: The care management team will reach out to the patient again over the next 30 days.        Medication Assistance: None required.   Patient affirms current coverage meets needs.  Compliance/Adherence/Medication fill history: Care Gaps: shingrix, COVID booster  Star-Rating Drugs: Lisinopril 12m - [facility]  [facility]  [id number] [place] [id number]: [phone]: [phone] pill box? No - different timing of medications Pt endorses 99% compliance  We discussed: Benefits of medication synchronization, packaging and delivery as well as enhanced pharmacist oversight with [employer]. Patient decided to: Continue current medication management strategy  Care Plan and Follow Up Patient Decision:  Patient agrees to Care Plan and Follow-up.  Plan: The care management team will reach out to the patient again over the next 30 days.   MaJeni SallesPharmD [facility] Hospit[facility]inical Pharmacist [place]

## 2021-10-05 DIAGNOSIS — I10 Essential (primary) hypertension: Secondary | ICD-10-CM | POA: Diagnosis not present

## 2021-10-05 DIAGNOSIS — N1831 Chronic kidney disease, stage 3a: Secondary | ICD-10-CM

## 2021-11-14 ENCOUNTER — Telehealth: Payer: Self-pay | Admitting: Pharmacist

## 2021-11-14 NOTE — Chronic Care Management (AMB) (Cosign Needed)
Chronic Care Management Pharmacy Assistant   Name: SAKIYA STEPKA  MRN: [id number] DOB: 1928/10/26  Reason for Encounter: Disease State / Hypertension Assessment Call    Conditions to be addressed/monitored: HTN  Recent office visits:  None  Recent consult visits:  None  Hospital visits:  None  Medications: Outpatient Encounter Medications as of 11/14/2021  Medication Sig   Blood Pressure Monitoring (COMFORT TOUCH BP CUFF/LARGE) MISC 1 Device by Does not apply route daily as needed.   feeding supplement (ENSURE ENLIVE / ENSURE PLUS) LIQD Take 237 mLs by mouth 2 (two) times daily between meals.   furosemide (LASIX) 40 MG tablet Take 0.5 tablets (20 mg total) by mouth daily as needed for fluid or edema (3lbs weight gain in 24 hours).   hydrALAZINE (APRESOLINE) 25 MG tablet Take 1 tablet (25 mg total) by mouth 3 (three) times daily.   levothyroxine (SYNTHROID) 75 MCG tablet TAKE 1 TABLET BY MOUTH EVERY DAY   lisinopril (ZESTRIL) 10 MG tablet Take 1 tablet (10 mg total) by mouth daily.   polyethylene glycol powder (GLYCOLAX/MIRALAX) 17 GM/SCOOP powder Take 17 g by mouth 2 (two) times daily as needed.   No facility-administered encounter medications on file as of 11/14/2021.  Fill History:  FUROSEMIDE 40MG  TAB 06/23/2021 90   LEVOTHYROXINE 75 MCG TABLET 05/19/2021 90   LISINOPRIL 10 MG TABLET 10/15/2021 90   HYDRALAZINE 25 MG TABLET 09/08/2021 90   Reviewed chart prior to disease state call. Spoke with patient regarding BP  Recent Office Vitals: BP Readings from Last 3 Encounters:  09/02/21 140/60  05/25/21 140/80  05/25/21 140/80   Pulse Readings from Last 3 Encounters:  09/02/21 65  05/25/21 60  05/25/21 60    Wt Readings from Last 3 Encounters:  09/02/21 152 lb 3.2 oz (69 kg)  05/25/21 147 lb (66.7 kg)  05/25/21 147 lb (66.7 kg)     Kidney Function Lab Results  Component Value Date/Time   CREATININE 1.56 (H) 04/20/2021 03:06 PM   CREATININE 1.49 (H)  03/29/2021 01:44 AM   CREATININE 1.49 (H) 08/20/2020 02:26 PM   CREATININE 1.98 (H) 08/11/2020 03:19 PM   GFR 29.06 (L) 04/20/2021 03:06 PM   GFRNONAA 33 (L) 03/29/2021 01:44 AM   GFRAA 40 (L) 05/07/2020 07:58 PM    BMP Latest Ref Rng & Units 04/20/2021 03/29/2021 03/28/2021  Glucose 70 - 99 mg/dL 03/30/2021) 99 92  BUN 6 - 23 mg/dL 128(N) 86(V) 67(M)  Creatinine 0.40 - 1.20 mg/dL 09(O) 7.09(G) 2.83(M)  BUN/Creat Ratio 6 - 22 (calc) - - -  Sodium 135 - 145 mEq/L 135 134(L) 134(L)  Potassium 3.5 - 5.1 mEq/L 4.3 4.3 4.2  Chloride 96 - 112 mEq/L 102 101 102  CO2 19 - 32 mEq/L 23 28 23   Calcium 8.4 - 10.5 mg/dL 9.8 9.2 6.29(U)   SCHEDULE FOLLOW UP Current antihypertensive regimen:  Lisinopril 10 mg daily  How often are you checking your Blood Pressure?   Current home BP readings:   What recent interventions/DTPs have been made by any provider to improve Blood Pressure control since last CPP Visit:   Any recent hospitalizations or ED visits since last visit with CPP?   What diet changes have been made to improve Blood Pressure Control?   What exercise is being done to improve your Blood Pressure Control?    Adherence Review: Is the patient currently on ACE/ARB medication? Yes Does the patient have >5 day gap between last estimated fill dates?  No  Unable to reach patient after 3 attempts.   Care Gaps: AWV - completed on 05/25/21 Zoster vaccines - never done Covid-19 vaccine - overdue Last BP - 140/60 on 09/02/2021  Star Rating Drugs: Lisinopril 10 mg - last filled [facility]  [facility]  Clinical Pharmacist Assistant [phone]

## 2021-12-05 ENCOUNTER — Other Ambulatory Visit: Payer: Self-pay | Admitting: Family Medicine

## 2021-12-13 ENCOUNTER — Telehealth (INDEPENDENT_AMBULATORY_CARE_PROVIDER_SITE_OTHER): Payer: Medicare HMO | Admitting: Family Medicine

## 2021-12-13 ENCOUNTER — Encounter: Payer: Self-pay | Admitting: Family Medicine

## 2021-12-13 ENCOUNTER — Telehealth: Payer: Self-pay | Admitting: Family Medicine

## 2021-12-13 ENCOUNTER — Telehealth: Payer: Medicare HMO | Admitting: Family Medicine

## 2021-12-13 DIAGNOSIS — R067 Sneezing: Secondary | ICD-10-CM

## 2021-12-13 DIAGNOSIS — J029 Acute pharyngitis, unspecified: Secondary | ICD-10-CM

## 2021-12-13 NOTE — Telephone Encounter (Signed)
Patient calling in with respiratory symptoms: Shortness of breath, chest pain, palpitations or other red words send to Triage  Does the patient have a fever over 100, cough, congestion, sore throat, runny nose, lost of taste/smell (please list symptoms that patient has)?sore throat, chills  What date did symptoms start?12-09-2021 (If over 5 days ago, pt may be scheduled for in person visit)  Have you tested for Covid in the last 5 days? No   If yes, was it positive []  OR negative [] ? If positive in the last 5 days, please schedule virtual visit now. If negative, schedule for an in person OV with the next available provider if PCP has no openings. Please also let patient know they will be tested again (follow the script below)  "you will have to arrive prior to your appt time to be Covid tested. Please park in back of office at the cone & call [phone] to let the staff know you have arrived. A staff member will meet you at your car to do a rapid covid test. Once the test has resulted you will be notified by phone of your results to determine if appt will remain an in person visit or be converted to a virtual/phone visit. If you arrive less than before your appt time, your visit will be automatically converted to virtual & any recommended testing will happen AFTER the visit."  Pt has virtual with dr [phone] 12-13-2021 1200 noon THINGS TO REMEMBER  If no availability for virtual visit in office,  please schedule another [place] office  If no availability at another [place] office, please instruct patient that they can schedule an evisit or virtual visit through their mychart account. Visits up to 8pm  patients can be seen in office 5 days after positive COVID test

## 2021-12-13 NOTE — Patient Instructions (Signed)
°  HOME CARE TIPS:  -COVID19 testing information: GoldAgenda.is  Most pharmacies also offer testing and home test kits. If the Covid19 test is positive and you desire antiviral treatment, please contact a [place] pharmacy or schedule a follow up virtual visit through your primary care office or through the [employer].  Other test to treat options: http://www.vasquez-vaughn.biz/?click_source=alert   -can use tylenol or aleve if needed for fevers, aches and pains per instructions  -warm salt water gargles twice daily for 3 days  -can use nasal saline a few times per day if you have nasal congestion  -stay hydrated, drink plenty of fluids and eat small healthy meals - avoid dairy  -can take 1000 IU ( ) Vit D3 and 100-500 mg of Vit C daily per instructions  -If the Covid test is positive, check out the [facility] website for more information on home care, transmission and treatment for COVID19  -follow up with your doctor in 2-3 days unless improving and feeling better  -stay home while sick, except to seek medical care. If you have COVID19, you will likely be contagious for 7-10 days. Flu or Influenza is likely contagious for about 7 days. Other respiratory viral infections remain contagious for 5-10+ days depending on the virus and many other factors. Wear a good mask that fits snugly (such as N95 or KN95) if around others to reduce the risk of transmission.  It was nice to meet you today, and I really hope you are feeling better soon. I help  out with telemedicine visits on Tuesdays and Thursdays and am happy to help if you need a follow up virtual visit on those days. Otherwise, if you have any concerns or questions following this visit please schedule a follow up visit with your Primary Care doctor or seek care at a local urgent care clinic to avoid delays in care.    Seek in person care or schedule a follow up video visit promptly if  your symptoms worsen, new concerns arise or you are not improving with treatment. Call 911 and/or seek emergency care if your symptoms are severe or life threatening.

## 2021-12-13 NOTE — Progress Notes (Signed)
Virtual Visit via Telephone Note  I connected with Linda Lucero on 12/13/21 at  5:00 PM EST by telephone and verified that I am speaking with the correct person using two identifiers.   I discussed the limitations of performing an evaluation and management service by telephone and requested permission for a phone visit. The patient expressed understanding and agreed to proceed.  Location patient:  [place] Location provider: work or home office Participants present for the call: patient, provider Patient did not have a visit with me in the prior 7 days to address this/these issue(s).   History of Present Illness:  Acute telemedicine visit for sore throat: -Onset: 2-3 days ago -Symptoms include:sore throat, runny nose, sneezing, had some chills the first day, nausea -son has had a cold -Denies:fevers, CP, SOB, body aches, diarrhea -Pertinent past medical history: see below -Pertinent medication allergies:  Allergies  Allergen Reactions   Diovan [Valsartan]     Pt reports not allergic 03/25/21   Doxazosin     Pt reports not allergic 03/25/21  -COVID-19 vaccine status: Immunization History  Administered Date(s) Administered   Fluad Quad(high Dose 65+) 10/27/2020, 09/02/2021   Influenza, High Dose Seasonal PF 09/04/2018, 08/01/2019   Influenza-Unspecified 08/01/2019   PFIZER(Purple Top)SARS-COV-2 Vaccination 01/11/2020, 02/10/2020, 08/17/2020, 02/26/2021   PNEUMOCOCCAL CONJUGATE-20 05/25/2021   Pneumococcal Conjugate-13 10/24/2017   Tdap 10/24/2017   Zoster, Live 10/02/2016     Past Medical History:  Diagnosis Date   Coronary artery disease    Hyperlipidemia    Hypertension    Renal artery stenosis (HCC)    Sleep apnea    Subclavian arterial stenosis (HCC)    Systolic murmur    Thyroid disease    Ventricular hypertrophy     Current Outpatient Medications on File Prior to Visit  Medication Sig Dispense Refill   Blood Pressure Monitoring (COMFORT TOUCH BP CUFF/LARGE) MISC  1 Device by Does not apply route daily as needed. 1 each 0   feeding supplement (ENSURE ENLIVE / ENSURE PLUS) LIQD Take 237 mLs by mouth 2 (two) times daily between meals. 237 mL 12   furosemide (LASIX) 40 MG tablet Take 0.5 tablets (20 mg total) by mouth daily as needed for fluid or edema (3lbs weight gain in 24 hours). 30 tablet 2   hydrALAZINE (APRESOLINE) 25 MG tablet TAKE 1 TABLET BY MOUTH THREE TIMES A DAY 270 tablet 1   levothyroxine (SYNTHROID) 75 MCG tablet TAKE 1 TABLET BY MOUTH EVERY DAY 90 tablet 1   lisinopril (ZESTRIL) 10 MG tablet Take 1 tablet (10 mg total) by mouth daily. 90 tablet 3   polyethylene glycol powder (GLYCOLAX/MIRALAX) 17 GM/SCOOP powder Take 17 g by mouth 2 (two) times daily as needed. 3350 g 1   No current facility-administered medications on file prior to visit.    Observations/Objective: Patient sounds cheerful and well on the phone. I do not appreciate any SOB. Speech and thought processing are grossly intact. Patient reported vitals:  Assessment and Plan:  Sore throat  Sneezing  -we discussed possible serious and likely etiologies, options for evaluation and workup, limitations of telemedicine visit vs in person visit, treatment, treatment risks and precautions. Pt prefers to treat via telemedicine empirically rather than in person at this moment. Query VURI, covid19 vs other. She has opted for salt water gargles, covid testing at home (son reports he can help her with this) and other symptomatic care measures per patient instructions. Advised can contact [facility] pharmacy if covid test positive and desires  tx.  Advised to seek prompt virtual visit or in person care if worsening, new symptoms arise, or if is not improving with treatment as expected per our conversation of expected course. Discussed options for follow up care. Did let this patient know that I do telemedicine on Tuesdays and Thursdays for [facility] and those are the days I am logged into the  system. Advised to schedule follow up visit with PCP, [facility] virtual visits or [facility] if any further questions or concerns to avoid delays in care.   I discussed the assessment and treatment plan with the patient. The patient was provided an opportunity to ask questions and all were answered. The patient agreed with the plan and demonstrated an understanding of the instructions.    Follow Up Instructions:  I did not refer this patient for an OV with me in the next 24 hours for this/these issue(s).  I discussed the assessment and treatment plan with the patient. The patient was provided an opportunity to ask questions and all were answered. The patient agreed with the plan and demonstrated an understanding of the instructions.   I spent 17 minutes on the date of this visit in the care of this patient. See summary of tasks completed to properly care for this patient in the detailed notes above which also included counseling of above, review of PMH, medications, allergies, evaluation of the patient and ordering and/or  instructing patient on testing and care options.     Terressa Koyanagi, DO

## 2021-12-15 ENCOUNTER — Other Ambulatory Visit (HOSPITAL_BASED_OUTPATIENT_CLINIC_OR_DEPARTMENT_OTHER): Payer: Self-pay

## 2021-12-15 NOTE — Progress Notes (Signed)
canceled

## 2021-12-16 ENCOUNTER — Telehealth: Payer: Self-pay | Admitting: Family Medicine

## 2021-12-16 ENCOUNTER — Other Ambulatory Visit: Payer: Self-pay

## 2021-12-16 ENCOUNTER — Encounter: Payer: Self-pay | Admitting: Adult Health

## 2021-12-16 ENCOUNTER — Telehealth (INDEPENDENT_AMBULATORY_CARE_PROVIDER_SITE_OTHER): Payer: Medicare HMO | Admitting: Adult Health

## 2021-12-16 VITALS — Wt 152.0 lb

## 2021-12-16 DIAGNOSIS — U071 COVID-19: Secondary | ICD-10-CM

## 2021-12-16 MED ORDER — MOLNUPIRAVIR EUA 200MG CAPSULE: 4.0000 | ORAL_CAPSULE | Freq: Two times a day (BID) | ORAL | 0 refills | Status: AC

## 2021-12-16 NOTE — Telephone Encounter (Signed)
Patient calling in with respiratory symptoms: Shortness of breath, chest pain, palpitations or other red words send to Triage  Does the patient have a fever over 100, cough, congestion, sore throat, runny nose, lost of taste/smell (please list symptoms that patient has)?cough  What date did symptoms start?11-08-2021 (If over 5 days ago, pt may be scheduled for in person visit)  Have you tested for Covid in the last 5 days? yes If yes, was it positive [x]  OR negative [] ? If positive in the last 5 days, please schedule virtual visit now. If negative, schedule for an in person OV with the next available provider if PCP has no openings. Please also let patient know they will be tested again (follow the script below)  "you will have to arrive prior to your appt time to be Covid tested. Please park in back of office at the [facility] & call [phone] to let the staff know you have arrived. A staff member will meet you at your car to do a rapid covid test. Once the test has resulted you will be notified by phone of your results to determine if appt will remain an in person visit or be converted to a virtual/phone visit. If you arrive less than before your appt time, your visit will be automatically converted to virtual & any recommended testing will happen AFTER the visit." Pt has appt with cory at 11 am today  THINGS TO REMEMBER  If no availability for virtual visit in office,  please schedule another [place] office  If no availability at another [place] office, please instruct patient that they can schedule an evisit or virtual visit through their mychart account. Visits up to 8pm  patients can be seen in office 5 days after positive COVID test

## 2021-12-16 NOTE — Progress Notes (Signed)
Virtual Visit via Video Note  I connected with Linda Lucero on 12/16/21 at 11:00 AM EST by a video enabled telemedicine application and verified that I am speaking with the correct person using two identifiers.  Location patient: home Location provider:work or home office Persons participating in the virtual visit: patient, provider, son   I discussed the limitations of evaluation and management by telemedicine and the availability of in person appointments. The patient expressed understanding and agreed to proceed.   HPI: 88 year old female who is being evaluated today for COVID-19 infection.  She reports that her symptoms started 3 days ago with a sore throat which has since resolved.  Symptoms currently are fatigue, weakness, nausea, and dry cough.  She will have chills periodically.  She denies diarrhea, vomiting, sinus pressure, or shortness of breath.  She is fully vaccinated against COVID-19  She took a home test yesterday which showed that she was positive for COVID-19   ROS: See pertinent positives and negatives per HPI.  Past Medical History:  Diagnosis Date   Coronary artery disease    Hyperlipidemia    Hypertension    Renal artery stenosis (HCC)    Sleep apnea    Subclavian arterial stenosis (HCC)    Systolic murmur    Thyroid disease    Ventricular hypertrophy     Past Surgical History:  Procedure Laterality Date   CHOLECYSTECTOMY      Family History  Problem Relation Age of Onset   Heart disease Mother    High blood pressure Mother    Lung cancer Father    Diabetes Maternal Grandmother    Heart disease Maternal Grandmother        Current Outpatient Medications:    Blood Pressure Monitoring (COMFORT TOUCH BP CUFF/LARGE) MISC, 1 Device by Does not apply route daily as needed., Disp: 1 each, Rfl: 0   feeding supplement (ENSURE ENLIVE / ENSURE PLUS) LIQD, Take 237 mLs by mouth 2 (two) times daily between meals., Disp: 237 mL, Rfl: 12   furosemide  (LASIX) 40 MG tablet, Take 0.5 tablets (20 mg total) by mouth daily as needed for fluid or edema (3lbs weight gain in 24 hours)., Disp: 30 tablet, Rfl: 2   hydrALAZINE (APRESOLINE) 25 MG tablet, TAKE 1 TABLET BY MOUTH THREE TIMES A DAY, Disp: 270 tablet, Rfl: 1   levothyroxine (SYNTHROID) 75 MCG tablet, TAKE 1 TABLET BY MOUTH EVERY DAY, Disp: 90 tablet, Rfl: 1   lisinopril (ZESTRIL) 10 MG tablet, Take 1 tablet (10 mg total) by mouth daily., Disp: 90 tablet, Rfl: 3   molnupiravir EUA (LAGEVRIO) 200 mg CAPS capsule, Take 4 capsules (800 mg total) by mouth 2 (two) times daily for 5 days., Disp: 40 capsule, Rfl: 0   polyethylene glycol powder (GLYCOLAX/MIRALAX) 17 GM/SCOOP powder, Take 17 g by mouth 2 (two) times daily as needed., Disp: 3350 g, Rfl: 1  EXAM:  VITALS per patient if applicable:  GENERAL: alert, oriented, appears well and in no acute distress  HEENT: atraumatic, conjunttiva clear, no obvious abnormalities on inspection of external nose and ears  NECK: normal movements of the head and neck  LUNGS: on inspection no signs of respiratory distress, breathing rate appears normal, no obvious gross SOB, gasping or wheezing  CV: no obvious cyanosis  MS: moves all visible extremities without noticeable abnormality  PSYCH/NEURO: pleasant and cooperative, no obvious depression or anxiety, speech and thought processing grossly intact  ASSESSMENT AND PLAN:  Discussed the following assessment and plan:  1.  COVID-19 virus infection -We will treat with antiviral therapy due to age and comorbid conditions.  Side effects of medication were reviewed with the patient.  Advise follow-up if symptoms worsen. - molnupiravir EUA (LAGEVRIO) 200 mg CAPS capsule; Take 4 capsules (800 mg total) by mouth 2 (two) times daily for 5 days.  Dispense: 40 capsule; Refill: 0     I discussed the assessment and treatment plan with the patient. The patient was provided an opportunity to ask questions and all  were answered. The patient agreed with the plan and demonstrated an understanding of the instructions.   The patient was advised to call back or seek an in-person evaluation if the symptoms worsen or if the condition fails to improve as anticipated.   Shirline Frees, NP

## 2021-12-21 ENCOUNTER — Ambulatory Visit: Payer: Medicare HMO | Admitting: Podiatry

## 2022-01-13 ENCOUNTER — Encounter: Payer: Self-pay | Admitting: Podiatry

## 2022-01-13 ENCOUNTER — Other Ambulatory Visit: Payer: Self-pay

## 2022-01-13 ENCOUNTER — Ambulatory Visit: Payer: Medicare HMO | Admitting: Podiatry

## 2022-01-13 DIAGNOSIS — B351 Tinea unguium: Secondary | ICD-10-CM | POA: Diagnosis not present

## 2022-01-13 DIAGNOSIS — M79671 Pain in right foot: Secondary | ICD-10-CM | POA: Diagnosis not present

## 2022-01-13 DIAGNOSIS — M79674 Pain in right toe(s): Secondary | ICD-10-CM | POA: Diagnosis not present

## 2022-01-13 DIAGNOSIS — L84 Corns and callosities: Secondary | ICD-10-CM

## 2022-01-13 DIAGNOSIS — M79672 Pain in left foot: Secondary | ICD-10-CM | POA: Diagnosis not present

## 2022-01-13 DIAGNOSIS — Q828 Other specified congenital malformations of skin: Secondary | ICD-10-CM

## 2022-01-13 DIAGNOSIS — M79675 Pain in left toe(s): Secondary | ICD-10-CM

## 2022-01-15 ENCOUNTER — Other Ambulatory Visit: Payer: Self-pay | Admitting: Family Medicine

## 2022-01-22 NOTE — Progress Notes (Signed)
?  Subjective:  ?Patient ID: Linda Lucero, female    DOB: 30-Jul-1928,  MRN: [id number] ? ?Linda Lucero presents to clinic today for painful elongated mycotic toenails 1-5 bilaterally which are tender when wearing enclosed shoe gear. Pain is relieved with periodic professional debridement. ? ?New problem(s): None.  ? ?PCP is Wynn Banker, MD , and last visit was September 02, 2021. ? ?Allergies  ?Allergen Reactions  ? Diovan [Valsartan]   ?  Pt reports not allergic 03/25/21  ? Doxazosin   ?  Pt reports not allergic 03/25/21  ? ?Review of Systems: Negative except as noted in the HPI. ? ?Objective:  ? ?Objective:  ?There were no vitals filed for this visit. ?Constitutional Patient is a pleasant 88 y.o.  female in NAD. AAO x 3.  ?Vascular CFT immediate b/l LE. Palpable DP/PT pulses b/l LE. Digital hair absent b/l. Skin temperature gradient WNL b/l. No pain with calf compression b/l. No edema noted b/l. No cyanosis or clubbing noted b/l LE. No cyanosis or clubbing noted.  ?Neurologic Normal speech. Protective sensation intact 5/5 intact bilaterally with 10g monofilament b/l. Vibratory sensation intact b/l.  ?Dermatologic Pedal skin warm and supple b/l.  No open wounds b/l. No interdigital macerations. Toenails 1-5 b/l elongated, thickened, discolored with subungual debris. +Tenderness with dorsal palpation of nailplates. Hyperkeratotic lesion(s) noted bilateral 5th toes.  Porokeratotic lesion(s) noted submet head 5 b/l.  ?Orthopedic: Muscle strength 5/5 to all lower extremity muscle groups bilaterally. Hammertoe deformity noted 1-5 b/l.  ? ?Assessment:  ? ?1. Pain due to onychomycosis of toenails of both feet   ?2. Porokeratosis   ?3. Corns   ?4. Pain in both feet   ? ?Plan:  ?-Examined patient. ?-Mycotic toenails 1-5 bilaterally were debrided in length and girth with sterile nail nippers and dremel without incident. ?-Corn(s) bilateral 5th toes pared utilizing sterile scalpel blade without complication or  incident. Total number debrided=2. ?-Painful porokeratotic lesion(s) submet head 5 b/l pared and enucleated with sterile scalpel blade without incident. Total number of lesions debrided=2. ?-Patient/POA to call should there be question/concern in the interim.  ? ?Return in about 3 months (around 04/15/2022). ? ?Freddie Breech, DPM  ?

## 2022-02-14 ENCOUNTER — Telehealth: Payer: Self-pay | Admitting: Pharmacist

## 2022-02-14 NOTE — Chronic Care Management (AMB) (Signed)
? ? ?Chronic Care Management ?Pharmacy Assistant  ? ?Name: Linda Lucero  MRN: [id number] DOB: Dec 25, 1927 ? ?Reason for Encounter: Disease State / Hypertension Assessment Call ?  ?Conditions to be addressed/monitored: ?HTN ? ?Recent office visits:  ?12/16/2021 Shirline Frees NP - Patient was seen for Covid 19 virus infection. Started Molnupiravir 800 mg twice daily. No follow up noted.  ? ?12/13/2021 Kriste Basque DO - Patient was seen for sore throat and an additional issue. No medication changes. Follow up in 2-3 days unless improving.  ? ?Recent consult visits:  ?01/13/2022 Geralynn Rile DPM (podiatry) - Patient was seen for Pain due to onychomycosis of toenails of both feet and additional issues.  ?No medication changes. Follow up in 3 months.  ? ?Hospital visits:  ?None ? ?Medications: ?Outpatient Encounter Medications as of 02/14/2022  ?Medication Sig  ? Blood Pressure Monitoring (COMFORT TOUCH BP CUFF/LARGE) MISC 1 Device by Does not apply route daily as needed.  ? feeding supplement (ENSURE ENLIVE / ENSURE PLUS) LIQD Take 237 mLs by mouth 2 (two) times daily between meals.  ? furosemide (LASIX) 40 MG tablet Take 0.5 tablets (20 mg total) by mouth daily as needed for fluid or edema (3lbs weight gain in 24 hours).  ? hydrALAZINE (APRESOLINE) 25 MG tablet TAKE 1 TABLET BY MOUTH THREE TIMES A DAY  ? levothyroxine (SYNTHROID) 75 MCG tablet TAKE 1 TABLET BY MOUTH EVERY DAY  ? lisinopril (ZESTRIL) 10 MG tablet TAKE 1 TABLET BY MOUTH EVERY DAY  ? polyethylene glycol powder (GLYCOLAX/MIRALAX) 17 GM/SCOOP powder Take 17 g by mouth 2 (two) times daily as needed.  ? ?No facility-administered encounter medications on file as of 02/14/2022.  ?Fill History: ?LISINOPRIL 10 MG TABLET 10/15/2021 90  ? ?FUROSEMIDE 40MG  TAB 06/23/2021 90  ? ?LEVOTHYROXINE 75 MCG TABLET 05/19/2021 90  ? ?HYDRALAZINE 25 MG TABLET 12/05/2021 90  ? ?Reviewed chart prior to disease state call. Spoke with patient regarding BP ? ?Recent Office  Vitals: ?BP Readings from Last 3 Encounters:  ?09/02/21 140/60  ?05/25/21 140/80  ?05/25/21 140/80  ? ?Pulse Readings from Last 3 Encounters:  ?09/02/21 65  ?05/25/21 60  ?05/25/21 60  ?  ?Wt Readings from Last 3 Encounters:  ?12/16/21 152 lb (68.9 kg)  ?12/13/21 152 lb 3.2 oz (69 kg)  ?09/02/21 152 lb 3.2 oz (69 kg)  ?  ? ?Kidney Function ?Lab Results  ?Component Value Date/Time  ? CREATININE 1.56 (H) 04/20/2021 03:06 PM  ? CREATININE 1.49 (H) 03/29/2021 01:44 AM  ? CREATININE 1.49 (H) 08/20/2020 02:26 PM  ? CREATININE 1.98 (H) 08/11/2020 03:19 PM  ? GFR 29.06 (L) 04/20/2021 03:06 PM  ? GFRNONAA 33 (L) 03/29/2021 01:44 AM  ? GFRAA 40 (L) 05/07/2020 07:58 PM  ? ? ? ?  Latest Ref Rng & Units 04/20/2021  ?  3:06 PM 03/29/2021  ?  1:44 AM 03/28/2021  ? 12:39 AM  ?BMP  ?Glucose 70 - 99 mg/dL 03/30/2021   99   92    ?BUN 6 - 23 mg/dL 27   32   35    ?Creatinine 0.40 - 1.20 mg/dL 465   0.35   4.65    ?Sodium 135 - 145 mEq/L 135   134   134    ?Potassium 3.5 - 5.1 mEq/L 4.3   4.3   4.2    ?Chloride 96 - 112 mEq/L 102   101   102    ?CO2 19 - 32 mEq/L 23   28  23    ?Calcium 8.4 - 10.5 mg/dL 9.8   9.2   8.7    ? ? ?Current antihypertensive regimen:  ?Lisinopril 10 mg daily ?Hydralazine 25 mg three times daily ? ?How often are you checking your Blood Pressure?  ? ?Current home BP readings:  ? ?What recent interventions/DTPs have been made by any provider to improve Blood Pressure control since last CPP Visit:  ? ?Any recent hospitalizations or ED visits since last visit with CPP?  ? ?What diet changes have been made to improve Blood Pressure Control?  ?Patient follows ?Breakfast - patient will have ?Lunch - patient will have ?Dinner - patient will have ? ?What exercise is being done to improve your Blood Pressure Control?  ? ? ?Adherence Review: ?Is the patient currently on ACE/ARB medication? Yes ?Does the patient have >5 day gap between last estimated fill dates? Yes ? ?Unable to reach patient after several attempts.  ? ?Care  Gaps: ?AWV - completed on 05/25/21 ?Last BP - 140/60 on 09/02/2021 ?Shingrix - never done ?Covid-19 vaccine - overdue ?  ?Star Rating Drugs: ?Lisinopril 10 mg - last filled 10/15/2021 90 DS at [facility] verified with pharmacy ? ?Inetta Fermo CMA  ?Clinical Pharmacist Assistant ?[phone] ? ?

## 2022-03-03 ENCOUNTER — Encounter: Payer: Self-pay | Admitting: Family Medicine

## 2022-03-03 ENCOUNTER — Ambulatory Visit (INDEPENDENT_AMBULATORY_CARE_PROVIDER_SITE_OTHER): Payer: Medicare HMO | Admitting: Family Medicine

## 2022-03-03 VITALS — BP 186/60 | HR 75 | Temp 98.1°F | Ht 60.0 in | Wt 152.0 lb

## 2022-03-03 DIAGNOSIS — E890 Postprocedural hypothyroidism: Secondary | ICD-10-CM

## 2022-03-03 DIAGNOSIS — D649 Anemia, unspecified: Secondary | ICD-10-CM

## 2022-03-03 DIAGNOSIS — N1831 Chronic kidney disease, stage 3a: Secondary | ICD-10-CM

## 2022-03-03 DIAGNOSIS — Z13220 Encounter for screening for lipoid disorders: Secondary | ICD-10-CM | POA: Diagnosis not present

## 2022-03-03 DIAGNOSIS — I509 Heart failure, unspecified: Secondary | ICD-10-CM

## 2022-03-03 DIAGNOSIS — I10 Essential (primary) hypertension: Secondary | ICD-10-CM

## 2022-03-03 LAB — LIPID PANEL
Cholesterol: 251 mg/dL — ABNORMAL HIGH (ref 0–200)
HDL: 96.9 mg/dL (ref >39.00)
LDL Cholesterol: 135 mg/dL — ABNORMAL HIGH (ref 0–99)
NonHDL: 154.54
Total CHOL/HDL Ratio: 3
Triglycerides: 100 mg/dL (ref 0.0–149.0)
VLDL: 20 mg/dL (ref 0.0–40.0)

## 2022-03-03 LAB — COMPREHENSIVE METABOLIC PANEL
ALT: 11 U/L (ref 0–35)
AST: 20 U/L (ref 0–37)
Albumin: 4.2 g/dL (ref 3.5–5.2)
Alkaline Phosphatase: 77 U/L (ref 39–117)
BUN: 23 mg/dL (ref 6–23)
CO2: 25 mEq/L (ref 19–32)
Calcium: 9.6 mg/dL (ref 8.4–10.5)
Chloride: 105 mEq/L (ref 96–112)
Creatinine, Ser: 1.23 mg/dL — ABNORMAL HIGH (ref 0.40–1.20)
GFR: 38.42 mL/min — ABNORMAL LOW (ref >60.00)
Glucose, Bld: 87 mg/dL (ref 70–99)
Potassium: 3.8 mEq/L (ref 3.5–5.1)
Sodium: 139 mEq/L (ref 135–145)
Total Bilirubin: 0.3 mg/dL (ref 0.2–1.2)
Total Protein: 7.8 g/dL (ref 6.0–8.3)

## 2022-03-03 LAB — CBC WITH DIFFERENTIAL/PLATELET
Basophils Absolute: 0.1 10*3/uL (ref 0.0–0.1)
Basophils Relative: 1 % (ref 0.0–3.0)
Eosinophils Absolute: 0.1 10*3/uL (ref 0.0–0.7)
Eosinophils Relative: 1.2 % (ref 0.0–5.0)
HCT: 30.2 % — ABNORMAL LOW (ref 36.0–46.0)
Hemoglobin: 9.8 g/dL — ABNORMAL LOW (ref 12.0–15.0)
Lymphocytes Relative: 38.6 % (ref 12.0–46.0)
Lymphs Abs: 3 10*3/uL (ref 0.7–4.0)
MCHC: 32.5 g/dL (ref 30.0–36.0)
MCV: 63.3 fl — ABNORMAL LOW (ref 78.0–100.0)
Monocytes Absolute: 0.5 10*3/uL (ref 0.1–1.0)
Monocytes Relative: 6.5 % (ref 3.0–12.0)
Neutro Abs: 4.2 10*3/uL (ref 1.4–7.7)
Neutrophils Relative %: 52.7 % (ref 43.0–77.0)
Platelets: 316 10*3/uL (ref 150.0–400.0)
RBC: 4.77 Mil/uL (ref 3.87–5.11)
RDW: 19.7 % — ABNORMAL HIGH (ref 11.5–15.5)
WBC: 7.9 10*3/uL (ref 4.0–10.5)

## 2022-03-03 LAB — FOLATE: Folate: >23.5 ng/mL (ref >5.9)

## 2022-03-03 LAB — FERRITIN: Ferritin: 13.6 ng/mL (ref 10.0–291.0)

## 2022-03-03 LAB — TSH: TSH: 6.78 u[IU]/mL — ABNORMAL HIGH (ref 0.35–5.50)

## 2022-03-03 LAB — VITAMIN B12: Vitamin B-12: 193 pg/mL — ABNORMAL LOW (ref 211–911)

## 2022-03-03 NOTE — Patient Instructions (Addendum)
"  Dinner for two" packaged meals ready to prepare/cook at home.  ? ?Check blood pressure through the weekend - no more than twice daily. Let me know numbers when I call with lab results.  ? ? ? ? ?

## 2022-03-03 NOTE — Progress Notes (Signed)
?Linda Lucero ?DOB: 1928/02/09 ?Encounter date: 03/03/2022 ? ?This is a 88 y.o. female who presents with ?Chief Complaint  ?Patient presents with  ? Follow-up  ? ? ?History of present illness: ? ?Doing fair. Blood pressure still elevated/fluctuating. Will nap during day if not sleeping well at night. Not sure why she can't sleep.  ? ?Appetite is not good. Weight has stayed stable.  ? ?Not taking water pill because weight at home has been stable. Will get some swelling in ankles if sitting prolonged. Took hydralazine this morning. Not checking pressures at home because it frustrates her. She gets occasional sensation of being wobbly - about once a week. Not vertigo,just off balance. No headache. No chest pain, no chest pressure. Breathing normal.  ? ?Had little blood spot in right eye in corner noted last month. Feels better now; went away. Slightly tender to touch at time she noticed it.  ? ?Does crossword puzzles for fun, watches [employer] and steve harvey.  ? ?Does go up and down steps in house at least a few times/day. Does walk in yard outside. ? ?She does prep her own food.  ? ?She is having normal daily bowel movements. Wheat cerealhelps with this. ? ?Did take hydralazine at home around 10am. She takes 3 times/day (sometimes 2 if she skips lunch).  ? ?Initial pressure today 160/90, recheck was 186/60, then my recheck was 180/70.  ? ?Allergies  ?Allergen Reactions  ? Diovan [Valsartan]   ?  Pt reports not allergic 03/25/21  ? Doxazosin   ?  Pt reports not allergic 03/25/21  ? ?Current Meds  ?Medication Sig  ? Blood Pressure Monitoring (COMFORT TOUCH BP CUFF/LARGE) MISC 1 Device by Does not apply route daily as needed.  ? feeding supplement (ENSURE ENLIVE / ENSURE PLUS) LIQD Take 237 mLs by mouth 2 (two) times daily between meals.  ? furosemide (LASIX) 40 MG tablet Take 0.5 tablets (20 mg total) by mouth daily as needed for fluid or edema (3lbs weight gain in 24 hours).  ? hydrALAZINE (APRESOLINE) 25 MG tablet TAKE 1  TABLET BY MOUTH THREE TIMES A DAY  ? levothyroxine (SYNTHROID) 75 MCG tablet TAKE 1 TABLET BY MOUTH EVERY DAY  ? lisinopril (ZESTRIL) 10 MG tablet TAKE 1 TABLET BY MOUTH EVERY DAY  ? polyethylene glycol powder (GLYCOLAX/MIRALAX) 17 GM/SCOOP powder Take 17 g by mouth 2 (two) times daily as needed.  ? ? ?Review of Systems  ?Constitutional:  Negative for chills, fatigue and fever.  ?Respiratory:  Negative for cough, chest tightness, shortness of breath and wheezing.   ?Cardiovascular:  Negative for chest pain, palpitations and leg swelling.  ? ?Objective: ? ?BP (!) 186/60 Comment: repeat by Mykal--jaf  Pulse 75   Temp 98.1 ?F (36.7 ?C) (Oral)   Ht 5' (1.524 m)   Wt 152 lb (68.9 kg)   SpO2 100%   BMI 29.69 kg/m?   Weight: 152 lb (68.9 kg)  ? ?BP Readings from Last 3 Encounters:  ?03/03/22 (!) 186/60  ?09/02/21 140/60  ?05/25/21 140/80  ? ?Wt Readings from Last 3 Encounters:  ?03/03/22 152 lb (68.9 kg)  ?12/16/21 152 lb (68.9 kg)  ?12/13/21 152 lb 3.2 oz (69 kg)  ? ? ?Physical Exam ?Constitutional:   ?   General: She is not in acute distress. ?   Appearance: She is well-developed.  ?Cardiovascular:  ?   Rate and Rhythm: Normal rate and regular rhythm.  ?   Heart sounds: Murmur heard.  ?Crescendo systolic murmur  is present with a grade of 3/6.  ?  No friction rub.  ?Pulmonary:  ?   Effort: Pulmonary effort is normal. No respiratory distress.  ?   Breath sounds: Normal breath sounds. No wheezing or rales.  ?Musculoskeletal:  ?   Right lower leg: No edema.  ?   Left lower leg: No edema.  ?Neurological:  ?   Mental Status: She is alert and oriented to person, place, and time.  ?Psychiatric:     ?   Behavior: Behavior normal.  ? ? ?Assessment/Plan ?1. Hypertension, unspecified type ?She is tolerating current medications.  Blood pressure continues to go up when we recheck in the office, but she does run better at home.  She continues with hydralazine 25 mg 3 times a day, lisinopril 10 mg daily.  I am hesitant to increase  blood pressure medications even though her systolic pressure was high, because her diastolic frequently is low.  Overall, she feels better now than she has at previous visits. ?- CBC with Differential/Platelet; Future ?- Comprehensive metabolic panel; Future ?- Comprehensive metabolic panel ?- CBC with Differential/Platelet ? ?2. Congestive heart failure, unspecified HF chronicity, unspecified heart failure type (HCC) ?Continue with Lasix as needed. ? ?3. Postablative hypothyroidism ?Synthroid 75 mcg daily. ?- TSH; Future ?- TSH ? ?4. Chronic kidney disease, stage 3a (HCC) ?Continue to monitor. ? ?5. Anemia, unspecified type ?- Vitamin B12; Future ?- Folate; Future ?- Ferritin; Future ?- Ferritin ?- Folate ?- Vitamin B12 ? ?6. Lipid screening ?Cholesterols been diet controlled. ?- Lipid panel; Future ?- Lipid panel ? ?Return in about 5 months (around 08/03/2022) for establish care with Dr. Casimiro Needle. ? ? ? ? ? ?Theodis Shove, MD ?

## 2022-03-08 MED ORDER — LEVOTHYROXINE SODIUM 75 MCG PO TABS: 75.0000 ug | ORAL_TABLET | Freq: Every day | ORAL | 1 refills | Status: DC

## 2022-03-08 NOTE — Addendum Note (Signed)
Addended by: Johnella Moloney on: 03/08/2022 03:28 PM ? ? Modules accepted: Orders ? ?

## 2022-03-23 ENCOUNTER — Ambulatory Visit (INDEPENDENT_AMBULATORY_CARE_PROVIDER_SITE_OTHER): Payer: Medicare HMO | Admitting: Licensed Clinical Social Worker

## 2022-03-23 DIAGNOSIS — I10 Essential (primary) hypertension: Secondary | ICD-10-CM

## 2022-03-23 DIAGNOSIS — N1831 Chronic kidney disease, stage 3a: Secondary | ICD-10-CM

## 2022-03-23 DIAGNOSIS — I509 Heart failure, unspecified: Secondary | ICD-10-CM

## 2022-03-28 ENCOUNTER — Telehealth: Payer: Self-pay

## 2022-03-28 NOTE — Chronic Care Management (AMB) (Signed)
Chronic Care Management    Clinical Social Work Note  03/28/2022 Name: Linda Lucero MRN: [id number] DOB: May 19, 1928  Linda Lucero is a 88 y.o. year old female who is a primary care patient of Koberlein, Steele Berg, MD. The CCM team was consulted to assist the patient with chronic disease management and/or care coordination needs related to: [employer] .   Engaged with patient's adult son by telephone for initial visit in response to provider referral for social work chronic care management and care coordination services.   Consent to Services:  The patient was given the following information about Chronic Care Management services today, agreed to services, and gave verbal consent: 1. CCM service includes personalized support from designated clinical staff supervised by the primary care provider, including individualized plan of care and coordination with other care providers 2. 24/7 contact phone numbers for assistance for urgent and routine care needs. 3. Service will only be billed when office clinical staff spend 20 minutes or more in a month to coordinate care. 4. Only one practitioner may furnish and bill the service in a calendar month. 5.The patient may stop CCM services at any time (effective at the end of the month) by phone call to the office staff. 6. The patient will be responsible for cost sharing (co-pay) of up to 20% of the service fee (after annual deductible is met). Patient agreed to services and consent obtained.  Patient agreed to services and consent obtained.   Assessment: Review of patient past medical history, allergies, medications, and health status, including review of relevant consultants reports was performed today as part of a comprehensive evaluation and provision of chronic care management and care coordination services.     SDOH (Social Determinants of Health) assessments and interventions performed:    Advanced Directives Status: Not addressed in  this encounter.  CCM Care Plan  Allergies  Allergen Reactions   Diovan [Valsartan]     Pt reports not allergic 03/25/21   Doxazosin     Pt reports not allergic 03/25/21    Outpatient Encounter Medications as of 03/23/2022  Medication Sig   Blood Pressure Monitoring (COMFORT TOUCH BP CUFF/LARGE) MISC 1 Device by Does not apply route daily as needed.   feeding supplement (ENSURE ENLIVE / ENSURE PLUS) LIQD Take 237 mLs by mouth 2 (two) times daily between meals.   furosemide (LASIX) 40 MG tablet Take 0.5 tablets (20 mg total) by mouth daily as needed for fluid or edema (3lbs weight gain in 24 hours).   hydrALAZINE (APRESOLINE) 25 MG tablet TAKE 1 TABLET BY MOUTH THREE TIMES A DAY   levothyroxine (SYNTHROID) 75 MCG tablet Take 1 tablet (75 mcg total) by mouth daily.   lisinopril (ZESTRIL) 10 MG tablet TAKE 1 TABLET BY MOUTH EVERY DAY   polyethylene glycol powder (GLYCOLAX/MIRALAX) 17 GM/SCOOP powder Take 17 g by mouth 2 (two) times daily as needed.   No facility-administered encounter medications on file as of 03/23/2022.    Patient Active Problem List   Diagnosis Date Noted   Malnutrition of moderate degree 03/28/2021   CHF (congestive heart failure) ([place]) 03/25/2021   Acute on chronic diastolic CHF (congestive heart failure) (H[facility])    Elevated troponin    Acute CHF ([place]) 03/24/2021   Coronary artery disease    Low back pain 09/06/2018   Hypertension 09/06/2018   Chronic kidney disease, stage 3a ([place]) 09/06/2018   Hypothyroidism 09/06/2018    Conditions to be addressed/monitored: CHF, HTN, and CKD Stage  3a  Care Plan : LCSW Plan of Care  Updates made by Rebekah Chesterfield, LCSW since 03/28/2022 12:00 AM     Problem: Quality of Life (General Plan of Care)      Goal: Quality of Life Maintained   Start Date: 03/23/2022  Expected End Date: 06/05/2022  This Visit's Progress: On track  Priority: High  Note:   Current Barriers:  Food Insecurity  CSW Clinical Goal(s):  Patient   will explore community resource options for unmet needs related to:  Food Insecurity  through collaboration with [place] representative, provider, and care team.  Interventions: Patient's adult son, Linda Lucero, provided all hx Patient has difficulty preparing meals that would positively manage chronic health conditions. LCSW completed care guide referral to assist pt with referral to Meals on Wheels and/or similar resources Pt's son requesting letter noting pt's chronic health conditions and benefit to have services that tailor to pt's dietary needs. Hoping letter will expedite services for local agencies Inter-disciplinary care team collaboration (see longitudinal plan of care) Evaluation of current treatment plan related to  self management and patient's adherence to plan as established by provider Review resources, discussed options and provided patient information about  Referral to care guide (Family interested in Meals On Wheels)  Solution-Focused Strategies employed:  Active listening / Reflection utilized  Emotional Support Provided Verbalization of feelings encouraged  Task & activities to accomplish goals: Attend all scheduled provider appts Utilize healthy coping skills and/or supportive resources discussed Contact PCP office with any questions or concerns           Follow Up Plan: SW will follow up with patient by phone over the next 4-6 weeks      Christa See, MSW, [facility] [email or web address] Phone [phone] 6:51 AM

## 2022-03-28 NOTE — Telephone Encounter (Signed)
   Telephone encounter was:  Unsuccessful.  03/28/2022 Name: Linda Lucero MRN: [id number] DOB: 01-05-1928  Unsuccessful outbound call made today to assist with:  Food Insecurity  Outreach Attempt:  1st Attempt  A HIPAA compliant voice message was left requesting a return call.  Instructed patient to call back at [phone].  Mohsen Odenthal, AAS Paralegal, [facility]  [address], [place] [address] ??[email or web address]  ?? [phone]   [email or web address]

## 2022-03-28 NOTE — Patient Instructions (Signed)
Visit Information  Thank you for taking time to visit with me today. Please don't hesitate to contact me if I can be of assistance to you before our next scheduled telephone appointment.  Following are the goals we discussed today:  Task & activities to accomplish goals: Attend all scheduled provider appts Utilize healthy coping skills and/or supportive resources discussed Contact PCP office with any questions or concerns  Our next appointment is by telephone on 04/06/22 at 3:00 PM  Please call the care guide team at [phone] if you need to cancel or reschedule your appointment.   If you are experiencing a Mental Health or Behavioral Health Crisis or need someone to talk to, please call 911   Following is a copy of your full plan of care:  Care Plan : LCSW Plan of Care  Updates made by Linda Larsson, LCSW since 03/28/2022 12:00 AM     Problem: Quality of Life (General Plan of Care)      Goal: Quality of Life Maintained   Start Date: 03/23/2022  Expected End Date: 06/05/2022  This Visit's Progress: On track  Priority: High  Note:   Current Barriers:  Food Insecurity  CSW Clinical Goal(s):  Patient  will explore community resource options for unmet needs related to:  Food Insecurity  through collaboration with Visual merchandiser, provider, and care team.  Interventions: Patient's adult son, [name], provided all hx Patient has difficulty preparing meals that would positively manage chronic health conditions. LCSW completed care guide referral to assist pt with referral to Meals on Wheels and/or similar resources Pt's son requesting letter noting pt's chronic health conditions and benefit to have services that tailor to pt's dietary needs. Hoping letter will expedite services for local agencies Inter-disciplinary care team collaboration (see longitudinal plan of care) Evaluation of current treatment plan related to  self management and patient's adherence to plan as  established by provider Review resources, discussed options and provided patient information about  Referral to care guide (Family interested in Meals On Wheels)  Solution-Focused Strategies employed:  Active listening / Reflection utilized  Emotional Support Provided Verbalization of feelings encouraged  Task & activities to accomplish goals: Attend all scheduled provider appts Utilize healthy coping skills and/or supportive resources discussed Contact PCP office with any questions or concerns          Linda Lucero was given information about Care Management services by the embedded care coordination team including:  Care Management services include personalized support from designated clinical staff supervised by her physician, including individualized plan of care and coordination with other care providers 24/7 contact phone numbers for assistance for urgent and routine care needs. The patient may stop CCM services at any time (effective at the end of the month) by phone call to the office staff.  Patient agreed to services and verbal consent obtained.   Patient verbalizes understanding of instructions and care plan provided today and agrees to view in MyChart. Active MyChart status and patient understanding of how to access instructions and care plan via MyChart confirmed with patient.     Jenel Lucks, MSW, LCSW [facility] Page  [facility] [email or web address] Phone [phone] 6:52 AM

## 2022-03-29 ENCOUNTER — Telehealth: Payer: Self-pay

## 2022-03-29 NOTE — Telephone Encounter (Signed)
   Telephone encounter was:  Successful.  03/29/2022 Name: Linda Lucero MRN: [id number] DOB: 07-Jun-1928  Linda Lucero is a 88 y.o. year old female who is a primary care patient of Wynn Banker, MD . The community resource team was consulted for assistance with  Meals on Wheels.  Care guide performed the following interventions: Spoke with patient's son Linda Lucero about placing a referral to [facility] through [facility]. Mr. Preston consented to the referral and I informed him that [facility] would be contacting him once they receive the referral.   Follow Up Plan:  Care guide will follow up with patient by phone over the next 7 days.  [name], AAS Paralegal, [facility] Care Guide  Embedded Care Coordination [place]  Care Management  [address] [place], [place] [address] ??[email or web address]  ?? [phone]   [email or web address]

## 2022-03-31 ENCOUNTER — Telehealth: Payer: Self-pay

## 2022-03-31 NOTE — Telephone Encounter (Signed)
   Telephone encounter was:  Successful.  03/31/2022 Name: Linda Lucero MRN: [id number] DOB: Apr 26, 1928  Linda Lucero is a 88 y.o. year old female who is a primary care patient of Wynn Banker, MD . The community resource team was consulted for assistance with  meals on wheels referral.  Care guide performed the following interventions: Received message via Epic from Kathlene November at [employer] patient has been contacted. Left a message. Added to the Inquiry List and a Child psychotherapist will be calling to schedule in-home assessment.   Follow Up Plan:  No further follow up planned at this time. The patient has been provided with needed resources.  Kensi Karr, AAS Paralegal, [facility] Care Guide  Embedded Care Coordination [place]  Care Management  [address] [place], [place] [address] ??[email or web address]  ?? [phone]   [email or web address]

## 2022-04-05 DIAGNOSIS — I130 Hypertensive heart and chronic kidney disease with heart failure and stage 1 through stage 4 chronic kidney disease, or unspecified chronic kidney disease: Secondary | ICD-10-CM

## 2022-04-05 DIAGNOSIS — N1831 Chronic kidney disease, stage 3a: Secondary | ICD-10-CM

## 2022-04-05 DIAGNOSIS — I509 Heart failure, unspecified: Secondary | ICD-10-CM

## 2022-04-06 ENCOUNTER — Telehealth: Payer: Medicare HMO

## 2022-04-11 ENCOUNTER — Telehealth: Payer: Self-pay | Admitting: Licensed Clinical Social Worker

## 2022-04-11 NOTE — Telephone Encounter (Signed)
    Clinical Social Work  Care Management   Phone Outreach    04/11/2022 Name: Linda Lucero MRN: [id number] DOB: 24-Nov-1927  Linda Lucero is a 88 y.o. year old female who is a primary care patient of Wynn Banker, MD .   Reason for referral: [employer] .    F/U phone call today to assess needs, progress and barriers with care plan goals.   Telephone outreach was unsuccessful. A HIPPA compliant phone message was left for the patient providing contact information and requesting a return call.   Plan:CCM LCSW will wait for return call. If no return call is received, LCSW will make another attempt in 1-2 weeks  Review of patient status, including review of consultants reports, relevant laboratory and other test results, and collaboration with appropriate care team members and the patient's provider was performed as part of comprehensive patient evaluation and provision of care management services.    Jenel Lucks, MSW, LCSW [facility]   [facility] [email or web address] Phone [phone] 8:17 AM

## 2022-04-18 ENCOUNTER — Ambulatory Visit (INDEPENDENT_AMBULATORY_CARE_PROVIDER_SITE_OTHER): Payer: Medicare HMO | Admitting: Licensed Clinical Social Worker

## 2022-04-18 ENCOUNTER — Ambulatory Visit (INDEPENDENT_AMBULATORY_CARE_PROVIDER_SITE_OTHER): Payer: Medicare HMO | Admitting: Podiatry

## 2022-04-18 ENCOUNTER — Encounter: Payer: Self-pay | Admitting: Podiatry

## 2022-04-18 DIAGNOSIS — N1831 Chronic kidney disease, stage 3a: Secondary | ICD-10-CM

## 2022-04-18 DIAGNOSIS — Q828 Other specified congenital malformations of skin: Secondary | ICD-10-CM

## 2022-04-18 DIAGNOSIS — M79672 Pain in left foot: Secondary | ICD-10-CM | POA: Diagnosis not present

## 2022-04-18 DIAGNOSIS — I509 Heart failure, unspecified: Secondary | ICD-10-CM

## 2022-04-18 DIAGNOSIS — M79674 Pain in right toe(s): Secondary | ICD-10-CM

## 2022-04-18 DIAGNOSIS — I10 Essential (primary) hypertension: Secondary | ICD-10-CM

## 2022-04-18 DIAGNOSIS — M79675 Pain in left toe(s): Secondary | ICD-10-CM

## 2022-04-18 DIAGNOSIS — B351 Tinea unguium: Secondary | ICD-10-CM

## 2022-04-18 DIAGNOSIS — M79671 Pain in right foot: Secondary | ICD-10-CM

## 2022-04-21 ENCOUNTER — Ambulatory Visit: Payer: Medicare HMO | Admitting: Podiatry

## 2022-04-24 ENCOUNTER — Telehealth: Payer: Self-pay | Admitting: Pharmacist

## 2022-04-24 NOTE — Chronic Care Management (AMB) (Unsigned)
Chronic Care Management Pharmacy Assistant   Name: Linda Lucero  MRN: [id number] DOB: 12/19/1927  Reason for Encounter: Disease State / Hypertension Assessment Call   Conditions to be addressed/monitored: HTN  Recent office visits:  12/16/2021 Shirline Frees NP - Patient was seen for Covid 19. Started Molnupiravir 800 mg twice daily. No follow up noted.   Kriste Basque DO - Patient was seen for sore throat and an additional issue. No medication changes. No follow up noted.   Recent consult visits:  01/13/2022 Geralynn Rile DPM (podiatry) - Patient was seen for Pain due to onychomycosis of toenails of both feet and additional issues. No medication changes. Follow up in 3 months.   Hospital visits:  None  Medications: Outpatient Encounter Medications as of 04/24/2022  Medication Sig   Blood Pressure Monitoring (COMFORT TOUCH BP CUFF/LARGE) MISC 1 Device by Does not apply route daily as needed.   feeding supplement (ENSURE ENLIVE / ENSURE PLUS) LIQD Take 237 mLs by mouth 2 (two) times daily between meals.   furosemide (LASIX) 40 MG tablet Take 0.5 tablets (20 mg total) by mouth daily as needed for fluid or edema (3lbs weight gain in 24 hours).   hydrALAZINE (APRESOLINE) 25 MG tablet TAKE 1 TABLET BY MOUTH THREE TIMES A DAY   levothyroxine (SYNTHROID) 75 MCG tablet Take 1 tablet (75 mcg total) by mouth daily.   lisinopril (ZESTRIL) 10 MG tablet TAKE 1 TABLET BY MOUTH EVERY DAY   polyethylene glycol powder (GLYCOLAX/MIRALAX) 17 GM/SCOOP powder Take 17 g by mouth 2 (two) times daily as needed.   No facility-administered encounter medications on file as of 04/24/2022.  Fill History: hydralazine 25 mg tablet 03/12/2022 90   LEVOTHYROXINE 75 MCG TABLET 03/08/2022 90   lisinopril (PRINIVIL,ZESTRIL) tablet 01/16/2022 90    LAGEVRIO 200 MG CAP (EUA) 12/16/2021 5   Reviewed chart prior to disease state call. Spoke with patient regarding BP  Recent Office Vitals: BP Readings from  Last 3 Encounters:  03/03/22 (!) 186/60  09/02/21 140/60  05/25/21 140/80   Pulse Readings from Last 3 Encounters:  03/03/22 75  09/02/21 65  05/25/21 60    Wt Readings from Last 3 Encounters:  03/03/22 152 lb (68.9 kg)  12/16/21 152 lb (68.9 kg)  12/13/21 152 lb 3.2 oz (69 kg)     Kidney Function Lab Results  Component Value Date/Time   CREATININE 1.23 (H) 03/03/2022 02:29 PM   CREATININE 1.56 (H) 04/20/2021 03:06 PM   CREATININE 1.49 (H) 08/20/2020 02:26 PM   CREATININE 1.98 (H) 08/11/2020 03:19 PM   GFR 38.42 (L) 03/03/2022 02:29 PM   GFRNONAA 33 (L) 03/29/2021 01:44 AM   GFRAA 40 (L) 05/07/2020 07:58 PM       Latest Ref Rng & Units 03/03/2022    2:29 PM 04/20/2021    3:06 PM 03/29/2021    1:44 AM  BMP  Glucose 70 - 99 mg/dL 87  638  99   BUN 6 - 23 mg/dL 23  27  32   Creatinine 0.40 - 1.20 mg/dL 7.56  4.33  2.95   Sodium 135 - 145 mEq/L 139  135  134   Potassium 3.5 - 5.1 mEq/L 3.8  4.3  4.3   Chloride 96 - 112 mEq/L 105  102  101   CO2 19 - 32 mEq/L 25  23  28    Calcium 8.4 - 10.5 mg/dL 9.6  9.8  9.2    SCHEDULE F/U Current antihypertensive regimen:  Lisinopril 10 mg daily Hydralazine 25 mg three times daily   How often are you checking your Blood Pressure?    Current home BP readings:    What recent interventions/DTPs have been made by any provider to improve Blood Pressure control since last CPP Visit:    Any recent hospitalizations or ED visits since last visit with CPP?    What diet changes have been made to improve Blood Pressure Control?  Patient follows Breakfast - patient will have Lunch - patient will have Dinner - patient will have   What exercise is being done to improve your Blood Pressure Control?     Adherence Review: Is the patient currently on ACE/ARB medication? Yes Does the patient have >5 day gap between last estimated fill dates? No   Care Gaps: AWV - completed 05/25/2021 Last BP - 186/60 on 03/03/2022 Covid booster -  overdue Shingrix - postponed  Star Rating Drugs: Lisinopril 10 mg - last filled 01/16/2022 90 [facility]  Inetta Fermo [facility]  Clinical Pharmacist Assistant [phone]

## 2022-04-24 NOTE — Progress Notes (Signed)
  Subjective:  Patient ID: Linda Lucero, female    DOB: 19-Mar-1928,  MRN: [id number]  Linda Lucero presents to clinic today for callus(es) b/l lower extremities and painful thick toenails that are difficult to trim. Painful toenails interfere with ambulation. Aggravating factors include wearing enclosed shoe gear. Pain is relieved with periodic professional debridement. Painful calluses are aggravated when weightbearing with and without shoegear. Pain is relieved with periodic professional debridement.  New problem(s): None.   PCP is Wynn Banker, MD (Inactive) , and last visit was March 03, 2022.  Allergies  Allergen Reactions   Diovan [Valsartan]     Pt reports not allergic 03/25/21   Doxazosin     Pt reports not allergic 03/25/21    Review of Systems: Negative except as noted in the HPI.  Objective: No changes noted in today's physical examination.  XraThere were no vitals filed for this visit. Constitutional Patient is a pleasant 88 y.o. female in NAD. AAO x 3.  Vascular CFT immediate b/l LE. Palpable DP/PT pulses b/l LE. Digital hair absent b/l. Skin temperature gradient WNL b/l. No pain with calf compression b/l. No edema noted b/l. No cyanosis or clubbing noted b/l LE. No cyanosis or clubbing noted.  Neurologic Normal speech. Protective sensation intact 5/5 intact bilaterally with 10g monofilament b/l. Vibratory sensation intact b/l.  Dermatologic Pedal skin warm and supple b/l.  No open wounds b/l. No interdigital macerations. Toenails 1-5 b/l elongated, thickened, discolored with subungual debris. +Tenderness with dorsal palpation of nailplates. Hyperkeratotic lesion(s) noted bilateral 5th toes.  Porokeratotic lesion(s) noted submet head 5 b/l.  Orthopedic: Muscle strength 5/5 to all lower extremity muscle groups bilaterally. Hammertoe deformity noted 1-5 b/l.   Assessment/Plan: 1. Pain due to onychomycosis of toenails of both feet   2. Porokeratosis   3. Pain in  both feet     -Patient was evaluated and treated. All patient's and/or POA's questions/concerns answered on today's visit. -Medicaid ABN signed. Patient consents for services of paring of corn(s)/callus(es)/porokeratos(es) today. Copy in patient chart. -Patient to continue soft, supportive shoe gear daily. -Mycotic toenails 1-5 bilaterally were debrided in length and girth with sterile nail nippers and dremel without incident. -Porokeratotic lesion(s) submet head 5 b/l pared and enucleated with sterile scalpel blade. Pinpoint bleeding left foot addressed with Lumicain. Triple antibiotic ointment and band-aid applied. Patient instructed to apply Neosporin to left foot once daily for one week. Call if she has any problems. She related understanding. Total number of lesions debrided=2. -Patient/POA to call should there be question/concern in the interim.   Return in about 3 months (around 07/19/2022).  Freddie Breech, DPM

## 2022-05-05 DIAGNOSIS — I509 Heart failure, unspecified: Secondary | ICD-10-CM

## 2022-05-05 DIAGNOSIS — N1831 Chronic kidney disease, stage 3a: Secondary | ICD-10-CM

## 2022-05-05 DIAGNOSIS — I10 Essential (primary) hypertension: Secondary | ICD-10-CM

## 2022-05-24 ENCOUNTER — Telehealth: Payer: Self-pay | Admitting: Family Medicine

## 2022-05-24 NOTE — Telephone Encounter (Signed)
Left message for patient to call back and schedule Medicare Annual Wellness Visit (AWV) either virtually or in office. Left  my jabber number [phone]   Last AWV ;05/25/21  please schedule at anytime with [facility] Nurse Health Advisor 1 or 2    

## 2022-05-26 ENCOUNTER — Ambulatory Visit (INDEPENDENT_AMBULATORY_CARE_PROVIDER_SITE_OTHER): Payer: Medicare HMO

## 2022-05-26 ENCOUNTER — Telehealth: Payer: Self-pay | Admitting: Family Medicine

## 2022-05-26 VITALS — Ht 62.0 in | Wt 152.0 lb

## 2022-05-26 DIAGNOSIS — Z0000 Encounter for general adult medical examination without abnormal findings: Secondary | ICD-10-CM | POA: Diagnosis not present

## 2022-05-26 NOTE — Telephone Encounter (Signed)
Pt called to speak to Ms. [facility]) says they were disconnected.    HC unavailable, please call her back at your convenience.  [phone]

## 2022-05-26 NOTE — Patient Instructions (Addendum)
Linda Lucero , Thank you for taking time to come for your Medicare Wellness Visit. I appreciate your ongoing commitment to your health goals. Please review the following plan we discussed and let me know if I can assist you in the future.   These are the goals we discussed:  Goals       Find Help in My Community      Task & activities to accomplish goals: Attend all scheduled provider appts Utilize healthy coping skills and/or supportive resources discussed Contact PCP office with any questions or concerns      No current goals (pt-stated)      Patient Stated      Less hip pain!       Patient Stated      Get blood pressure under control        This is a list of the screening recommended for you and due dates:  Health Maintenance  Topic Date Due   Zoster (Shingles) Vaccine (1 of 2) 06/02/2022*   COVID-19 Vaccine (5 - Pfizer series) 06/11/2022*   Flu Shot  06/06/2022   Tetanus Vaccine  10/25/2027   Pneumonia Vaccine  Completed   DEXA scan (bone density measurement)  Completed   HPV Vaccine  Aged Out  *Topic was postponed. The date shown is not the original due date.   Advanced directives: No  Conditions/risks identified: None  Next appointment: Follow up in one year for your annual wellness visit     Preventive Care 65 Years and Older, Female Preventive care refers to lifestyle choices and visits with your health care provider that can promote health and wellness. What does preventive care include? A yearly physical exam. This is also called an annual well check. Dental exams once or twice a year. Routine eye exams. Ask your health care provider how often you should have your eyes checked. Personal lifestyle choices, including: Daily care of your teeth and gums. Regular physical activity. Eating a healthy diet. Avoiding tobacco and drug use. Limiting alcohol use. Practicing safe sex. Taking low-dose aspirin every day. Taking vitamin and mineral supplements as  recommended by your health care provider. What happens during an annual well check? The services and screenings done by your health care provider during your annual well check will depend on your age, overall health, lifestyle risk factors, and family history of disease. Counseling  Your health care provider may ask you questions about your: Alcohol use. Tobacco use. Drug use. Emotional well-being. Home and relationship well-being. Sexual activity. Eating habits. History of falls. Memory and ability to understand (cognition). Work and work Astronomer. Reproductive health. Screening  You may have the following tests or measurements: Height, weight, and BMI. Blood pressure. Lipid and cholesterol levels. These may be checked every 5 years, or more frequently if you are over 72 years old. Skin check. Lung cancer screening. You may have this screening every year starting at age 8 if you have a 30-pack-year history of smoking and currently smoke or have quit within the past 15 years. Fecal occult blood test (FOBT) of the stool. You may have this test every year starting at age 48. Flexible sigmoidoscopy or colonoscopy. You may have a sigmoidoscopy every 5 years or a colonoscopy every 10 years starting at age 99. Hepatitis C blood test. Hepatitis B blood test. Sexually transmitted disease (STD) testing. Diabetes screening. This is done by checking your blood sugar (glucose) after you have not eaten for a while (fasting). You may have this done  every 1-3 years. Bone density scan. This is done to screen for osteoporosis. You may have this done starting at age 82. Mammogram. This may be done every 1-2 years. Talk to your health care provider about how often you should have regular mammograms. Talk with your health care provider about your test results, treatment options, and if necessary, the need for more tests. Vaccines  Your health care provider may recommend certain vaccines, such  as: Influenza vaccine. This is recommended every year. Tetanus, diphtheria, and acellular pertussis (Tdap, Td) vaccine. You may need a Td booster every 10 years. Zoster vaccine. You may need this after age 53. Pneumococcal 13-valent conjugate (PCV13) vaccine. One dose is recommended after age 88. Pneumococcal polysaccharide (PPSV23) vaccine. One dose is recommended after age 7. Talk to your health care provider about which screenings and vaccines you need and how often you need them. This information is not intended to replace advice given to you by your health care provider. Make sure you discuss any questions you have with your health care provider. Document Released: 11/19/2015 Document Revised: 07/12/2016 Document Reviewed: 08/24/2015 Elsevier Interactive Patient Education  2017 [place] Prevention in the Home Falls can cause injuries. They can happen to people of all ages. There are many things you can do to make your home safe and to help prevent falls. What can I do on the outside of my home? Regularly fix the edges of walkways and driveways and fix any cracks. Remove anything that might make you trip as you walk through a door, such as a raised step or threshold. Trim any bushes or trees on the path to your home. Use bright outdoor lighting. Clear any walking paths of anything that might make someone trip, such as rocks or tools. Regularly check to see if handrails are loose or broken. Make sure that both sides of any steps have handrails. Any raised decks and porches should have guardrails on the edges. Have any leaves, snow, or ice cleared regularly. Use sand or salt on walking paths during winter. Clean up any spills in your garage right away. This includes oil or grease spills. What can I do in the bathroom? Use night lights. Install grab bars by the toilet and in the tub and shower. Do not use towel bars as grab bars. Use non-skid mats or decals in the tub or  shower. If you need to sit down in the shower, use a plastic, non-slip stool. Keep the floor dry. Clean up any water that spills on the floor as soon as it happens. Remove soap buildup in the tub or shower regularly. Attach bath mats securely with double-sided non-slip rug tape. Do not have throw rugs and other things on the floor that can make you trip. What can I do in the bedroom? Use night lights. Make sure that you have a light by your bed that is easy to reach. Do not use any sheets or blankets that are too big for your bed. They should not hang down onto the floor. Have a firm chair that has side arms. You can use this for support while you get dressed. Do not have throw rugs and other things on the floor that can make you trip. What can I do in the kitchen? Clean up any spills right away. Avoid walking on wet floors. Keep items that you use a lot in easy-to-reach places. If you need to reach something above you, use a strong step stool that has  a grab bar. Keep electrical cords out of the way. Do not use floor polish or wax that makes floors slippery. If you must use wax, use non-skid floor wax. Do not have throw rugs and other things on the floor that can make you trip. What can I do with my stairs? Do not leave any items on the stairs. Make sure that there are handrails on both sides of the stairs and use them. Fix handrails that are broken or loose. Make sure that handrails are as long as the stairways. Check any carpeting to make sure that it is firmly attached to the stairs. Fix any carpet that is loose or worn. Avoid having throw rugs at the top or bottom of the stairs. If you do have throw rugs, attach them to the floor with carpet tape. Make sure that you have a light switch at the top of the stairs and the bottom of the stairs. If you do not have them, ask someone to add them for you. What else can I do to help prevent falls? Wear shoes that: Do not have high heels. Have  rubber bottoms. Are comfortable and fit you well. Are closed at the toe. Do not wear sandals. If you use a stepladder: Make sure that it is fully opened. Do not climb a closed stepladder. Make sure that both sides of the stepladder are locked into place. Ask someone to hold it for you, if possible. Clearly mark and make sure that you can see: Any grab bars or handrails. First and last steps. Where the edge of each step is. Use tools that help you move around (mobility aids) if they are needed. These include: Canes. Walkers. Scooters. Crutches. Turn on the lights when you go into a dark area. Replace any light bulbs as soon as they burn out. Set up your furniture so you have a clear path. Avoid moving your furniture around. If any of your floors are uneven, fix them. If there are any pets around you, be aware of where they are. Review your medicines with your doctor. Some medicines can make you feel dizzy. This can increase your chance of falling. Ask your doctor what other things that you can do to help prevent falls. This information is not intended to replace advice given to you by your health care provider. Make sure you discuss any questions you have with your health care provider. Document Released: 08/19/2009 Document Revised: 03/30/2016 Document Reviewed: 11/27/2014 Elsevier Interactive Patient Education  2017 [employer].

## 2022-05-26 NOTE — Progress Notes (Signed)
Subjective:   Linda Lucero is a 88 y.o. female who presents for Medicare Annual (Subsequent) preventive examination.  Review of Systems    Virtual Visit via Telephone Note  I connected with  Linda Lucero on 05/26/22 at  2:00 PM EDT by telephone and verified that I am speaking with the correct person using two identifiers.  Location: Patient: Home Provider: Office Persons participating in the virtual visit: patient/Nurse Health Advisor   I discussed the limitations, risks, security and privacy concerns of performing an evaluation and management service by telephone and the availability of in person appointments. The patient expressed understanding and agreed to proceed.  Interactive audio and video telecommunications were attempted between this nurse and patient, however failed, due to patient having technical difficulties OR patient did not have access to video capability.  We continued and completed visit with audio only.  Some vital signs may be absent or patient reported.   Tillie Rung, LPN  Cardiac Risk Factors include: advanced age (>54men, >76 women);hypertension     Objective:    Today's Vitals   05/26/22 1415  Weight: 152 lb (68.9 kg)  Height: 5\' 2"  (1.575 m)   Body mass index is 27.8 kg/m.     05/26/2022    2:24 PM 05/25/2021    2:01 PM 03/24/2021   10:20 PM 05/25/2020    2:35 PM 05/08/2020    3:28 AM 12/26/2018    9:21 AM 12/25/2018    2:31 PM  Advanced Directives  Does Patient Have a Medical Advance Directive? No No No No No No No  Would patient like information on creating a medical advance directive? No - Patient declined No - Patient declined  Yes (MAU/Ambulatory/Procedural Areas - Information given) No - Patient declined Yes (MAU/Ambulatory/Procedural Areas - Information given) Yes (MAU/Ambulatory/Procedural Areas - Information given)    Current Medications (verified) Outpatient Encounter Medications as of 05/26/2022  Medication Sig   Blood  Pressure Monitoring (COMFORT TOUCH BP CUFF/LARGE) MISC 1 Device by Does not apply route daily as needed.   feeding supplement (ENSURE ENLIVE / ENSURE PLUS) LIQD Take 237 mLs by mouth 2 (two) times daily between meals.   furosemide (LASIX) 40 MG tablet Take 0.5 tablets (20 mg total) by mouth daily as needed for fluid or edema (3lbs weight gain in 24 hours).   hydrALAZINE (APRESOLINE) 25 MG tablet TAKE 1 TABLET BY MOUTH THREE TIMES A DAY   levothyroxine (SYNTHROID) 75 MCG tablet Take 1 tablet (75 mcg total) by mouth daily.   lisinopril (ZESTRIL) 10 MG tablet TAKE 1 TABLET BY MOUTH EVERY DAY   polyethylene glycol powder (GLYCOLAX/MIRALAX) 17 GM/SCOOP powder Take 17 g by mouth 2 (two) times daily as needed.   No facility-administered encounter medications on file as of 05/26/2022.    Allergies (verified) Diovan [valsartan] and Doxazosin   History: Past Medical History:  Diagnosis Date   Coronary artery disease    Hyperlipidemia    Hypertension    Renal artery stenosis (HCC)    Sleep apnea    Subclavian arterial stenosis (HCC)    Systolic murmur    Thyroid disease    Ventricular hypertrophy    Past Surgical History:  Procedure Laterality Date   CHOLECYSTECTOMY     Family History  Problem Relation Age of Onset   Heart disease Mother    High blood pressure Mother    Lung cancer Father    Diabetes Maternal Grandmother    Heart disease Maternal Grandmother  Social History   Socioeconomic History   Marital status: Widowed    Spouse name: Not on file   Number of children: 4   Years of education: Not on file   Highest education level: Not on file  Occupational History   Occupation: retired    Comment: retired  Tobacco Use   Smoking status: Former    Types: Cigarettes   Smokeless tobacco: Never  Substance and Sexual Activity   Alcohol use: Not on file   Drug use: Not on file   Sexual activity: Not on file  Other Topics Concern   Not on file  Social History Narrative    Lives with daughter in 2 level home; has 1 son in [place], and 2 other children who lives outside of [place].   Social Determinants of Health   Financial Resource Strain: Low Risk  (05/26/2022)   Overall Financial Resource Strain (CARDIA)    Difficulty of Paying Living Expenses: Not hard at all  Food Insecurity: No Food Insecurity (05/26/2022)   Hunger Vital Sign    Worried About Running Out of Food in the Last Year: Never true    Ran Out of Food in the Last Year: Never true  Recent Concern: Food Insecurity - Food Insecurity Present (03/29/2022)   Hunger Vital Sign    Worried About Running Out of Food in the Last Year: Sometimes true    Ran Out of Food in the Last Year: Sometimes true  Transportation Needs: No Transportation Needs (05/26/2022)   PRAPARE - Administrator, Civil Service (Medical): No    Lack of Transportation (Non-Medical): No  Physical Activity: Insufficiently Active (05/26/2022)   Exercise Vital Sign    Days of Exercise per Week: 5 days    Minutes of Exercise per Session: 20 min  Stress: No Stress Concern Present (05/26/2022)   [employer] of Occupational Health - Occupational Stress Questionnaire    Feeling of Stress : Not at all  Social Connections: Moderately Integrated (05/26/2022)   Social Connection and Isolation Panel [NHANES]    Frequency of Communication with Friends and Family: More than three times a week    Frequency of Social Gatherings with Friends and Family: More than three times a week    Attends Religious Services: More than 4 times per year    Active Member of [employer] or Organizations: Yes    Attends Banker Meetings: More than 4 times per year    Marital Status: Widowed    Tobacco Counseling Counseling given: Not Answered   Clinical Intake:  How often do you need to have someone help you when you read instructions, pamphlets, or other written materials from your doctor or pharmacy?: 3 - Sometimes (Son assist)  Diabetic?   No  Interpreter Needed?: No  Activities of Daily Living    05/26/2022    2:22 PM  In your present state of health, do you have any difficulty performing the following activities:  Hearing? 0  Vision? 0  Difficulty concentrating or making decisions? 0  Walking or climbing stairs? 0  Dressing or bathing? 0  Doing errands, shopping? 1  Comment Son Ship broker and eating ? N  Using the Toilet? N  In the past six months, have you accidently leaked urine? N  Do you have problems with loss of bowel control? N  Managing your Medications? N  Managing your Finances? N  Housekeeping or managing your Housekeeping? N    Patient Care Team:  Wynn Banker, MD (Inactive) as PCP - General (Family Medicine) Verner Chol, [facility] as Pharmacist (Pharmacist)  Indicate any recent Medical Services you may have received from other than [facility] providers in the past year (date may be approximate).     Assessment:   This is a routine wellness examination for Linda Lucero.  Hearing/Vision screen Hearing Screening - Comments:: No hearing difficulty Vision Screening - Comments:: No vision difficulty. Followed by Dr Elmer Picker  Dietary issues and exercise activities discussed: Exercise limited by: None identified   Goals Addressed               This Visit's Progress     No current goals (pt-stated)         Depression Screen    05/26/2022    2:19 PM 05/25/2021    2:02 PM 05/25/2020    2:33 PM 05/14/2020    3:27 PM 12/25/2018    2:43 PM  PHQ 2/9 Scores  PHQ - 2 Score 0 0 0 4 2  PHQ- 9 Score     6    Fall Risk    05/26/2022    2:23 PM 05/25/2021    2:02 PM 05/25/2020    2:37 PM 12/25/2018    2:43 PM  Fall Risk   Falls in the past year? 0 0 0 0  Number falls in past yr: 0 0 0   Injury with Fall? 0 0 0   Risk for fall due to : No Fall Risks  Impaired mobility;Impaired balance/gait;Impaired vision Impaired balance/gait;Impaired mobility;Impaired vision  Follow up  Falls evaluation  completed Falls prevention discussed Falls prevention discussed  Comment  uses cane as needed      FALL RISK PREVENTION PERTAINING TO THE HOME:  Any stairs in or around the home? Yes  If so, are there any without handrails? No  Home free of loose throw rugs in walkways, pet beds, electrical cords, etc? Yes  Adequate lighting in your home to reduce risk of falls? Yes   ASSISTIVE DEVICES UTILIZED TO PREVENT FALLS:  Life alert? No  Use of a cane, walker or w/c? Yes  Grab bars in the bathroom? Yes  Shower chair or bench in shower? No  Elevated toilet seat or a handicapped toilet? Yes   TIMED UP AND GO:  Was the test performed? No . Audio Visit  Cognitive Function:        05/26/2022    2:24 PM 05/25/2020    2:39 PM  6CIT Screen  What Year? 0 points 0 points  What month? 0 points 0 points  What time? 0 points 0 points  Count back from 20 0 points 0 points  Months in reverse 0 points 0 points  Repeat phrase 0 points 4 points  Total Score 0 points 4 points    Immunizations Immunization History  Administered Date(s) Administered   Fluad Quad(high Dose 65+) 10/27/2020, 09/02/2021   Influenza, High Dose Seasonal PF 09/04/2018, 08/01/2019   Influenza-Unspecified 08/01/2019   PFIZER(Purple Top)SARS-COV-2 Vaccination 01/11/2020, 02/10/2020, 08/17/2020, 02/26/2021   PNEUMOCOCCAL CONJUGATE-20 05/25/2021   Pneumococcal Conjugate-13 10/24/2017   Tdap 10/24/2017   Zoster, Live 10/02/2016    TDAP status: Up to date  Flu Vaccine status: Up to date  Pneumococcal vaccine status: Up to date  Covid-19 vaccine status: Completed vaccines  Qualifies for Shingles Vaccine? Yes   Zostavax completed No   Shingrix Completed?: No.    Education has been provided regarding the importance of this vaccine. Patient has  been advised to call insurance company to determine out of pocket expense if they have not yet received this vaccine. Advised may also receive vaccine at local pharmacy or Health  Dept. Verbalized acceptance and understanding.  Screening Tests Health Maintenance  Topic Date Due   Zoster Vaccines- Shingrix (1 of 2) 06/02/2022 (Originally 08/31/1978)   COVID-19 Vaccine (5 - Pfizer series) 06/11/2022 (Originally 04/23/2021)   INFLUENZA VACCINE  06/06/2022   TETANUS/TDAP  10/25/2027   Pneumonia Vaccine 37+ Years old  Completed   DEXA SCAN  Completed   HPV VACCINES  Aged Out    Health Maintenance  There are no preventive care reminders to display for this patient.   Colorectal cancer screening: No longer required.   Mammogram status: No longer required due to Age.    Lung Cancer Screening: (Low Dose CT Chest recommended if Age 34-80 years, 30 pack-year currently smoking OR have quit w/in 15years.) does not qualify.     Additional Screening:  Hepatitis C Screening: does not qualify; Completed   Vision Screening: Recommended annual ophthalmology exams for early detection of glaucoma and other disorders of the eye. Is the patient up to date with their annual eye exam?  Yes  Who is the provider or what is the name of the office in which the patient attends annual eye exams? Dr Elmer Picker If pt is not established with a provider, would they like to be referred to a provider to establish care? No .   Dental Screening: Recommended annual dental exams for proper oral hygiene  Community Resource Referral / Chronic Care Management:  CRR required this visit?  No   CCM required this visit?  No      Plan:     I have personally reviewed and noted the following in the patient's chart:   Medical and social history Use of alcohol, tobacco or illicit drugs  Current medications and supplements including opioid prescriptions.  Functional ability and status Nutritional status Physical activity Advanced directives List of other physicians Hospitalizations, surgeries, and ER visits in previous 12 months Vitals Screenings to include cognitive, depression, and  falls Referrals and appointments  In addition, I have reviewed and discussed with patient certain preventive protocols, quality metrics, and best practice recommendations. A written personalized care plan for preventive services as well as general preventive health recommendations were provided to patient.     Tillie Rung, LPN   5/88/5027   Nurse Notes: None

## 2022-06-08 ENCOUNTER — Encounter: Payer: Self-pay | Admitting: Licensed Clinical Social Worker

## 2022-06-08 ENCOUNTER — Ambulatory Visit: Payer: Medicare HMO | Admitting: Licensed Clinical Social Worker

## 2022-06-08 NOTE — Patient Instructions (Signed)
Visit Information  Thank you for taking time to visit with me today. Please don't hesitate to contact me if I can be of assistance to you.   Following are the goals we discussed today:   Goals Addressed             This Visit's Progress    COMPLETED: Obtain referral to Meals on Wheels   On track    Care Coordination Interventions: Active listening / Reflection utilized  Emotional Support Provided Problem Solving /Task Center strategies reviewed LCSW provided listing of home aid agencies for family to review (light cleaning) LCSW provided resources to apply for Medicaid MOW Coordinator visited home 06/07/22 and assisted pt with completing application. Services are expected to begin in 3-4 weeks        Please call the care guide team at [phone] if you need to cancel or reschedule your appointment.   If you are experiencing a Mental Health or Behavioral Health Crisis or need someone to talk to, please call the Suicide and Crisis Lifeline: 988 call 911   Patient verbalizes understanding of instructions and care plan provided today and agrees to view in MyChart. Active MyChart status and patient understanding of how to access instructions and care plan via MyChart confirmed with patient.     No further follow up required: Resources provided  Jenel Lucks, MSW, LCSW [facility] Care Management [facility]  [facility] [email or web address] Phone [phone] 3:33 PM

## 2022-06-08 NOTE — Patient Outreach (Signed)
  Care Coordination   Initial Visit Note   06/08/2022 Name: Linda Lucero MRN: [id number] DOB: 10-11-1928  Linda Lucero is a 88 y.o. year old female who sees Eulis Foster, FNP for primary care. I spoke with  Linda Lucero by phone today  What matters to the patients health and wellness today?  Food Resources and information on aid agencies    Goals Addressed             This Visit's Progress    COMPLETED: Obtain referral to Meals on Wheels   On track    Care Coordination Interventions: Active listening / Reflection utilized  Emotional Support Provided Problem Solving /Task Center strategies reviewed LCSW provided listing of home aid agencies for family to review (light cleaning) LCSW provided resources to apply for Medicaid MOW Coordinator visited home 06/07/22 and assisted pt with completing application. Services are expected to begin in 3-4 weeks        SDOH assessments and interventions completed:  No     Care Coordination Interventions Activated:  Yes  Care Coordination Interventions:  Yes, provided   Follow up plan: No further intervention required.   Encounter Outcome:  Pt. Visit Completed   Jenel Lucks, MSW, LCSW [facility] Care Management [facility]  [facility] [email or web address] Phone [phone] 3:32 PM

## 2022-06-12 ENCOUNTER — Encounter: Payer: Medicare HMO | Admitting: Family Medicine

## 2022-06-16 ENCOUNTER — Telehealth: Payer: Medicare HMO

## 2022-07-03 ENCOUNTER — Ambulatory Visit (INDEPENDENT_AMBULATORY_CARE_PROVIDER_SITE_OTHER): Payer: Medicare HMO | Admitting: Family Medicine

## 2022-07-03 ENCOUNTER — Encounter: Payer: Self-pay | Admitting: Family Medicine

## 2022-07-03 VITALS — BP 156/76 | HR 75 | Temp 98.0°F | Ht 62.0 in | Wt 147.9 lb

## 2022-07-03 DIAGNOSIS — I5033 Acute on chronic diastolic (congestive) heart failure: Secondary | ICD-10-CM

## 2022-07-03 DIAGNOSIS — D631 Anemia in chronic kidney disease: Secondary | ICD-10-CM

## 2022-07-03 DIAGNOSIS — E890 Postprocedural hypothyroidism: Secondary | ICD-10-CM

## 2022-07-03 DIAGNOSIS — N1831 Chronic kidney disease, stage 3a: Secondary | ICD-10-CM

## 2022-07-03 DIAGNOSIS — E538 Deficiency of other specified B group vitamins: Secondary | ICD-10-CM | POA: Insufficient documentation

## 2022-07-03 DIAGNOSIS — N1832 Chronic kidney disease, stage 3b: Secondary | ICD-10-CM

## 2022-07-03 DIAGNOSIS — I10 Essential (primary) hypertension: Secondary | ICD-10-CM

## 2022-07-03 DIAGNOSIS — L89321 Pressure ulcer of left buttock, stage 1: Secondary | ICD-10-CM | POA: Diagnosis not present

## 2022-07-03 LAB — VITAMIN B12: Vitamin B-12: 264 pg/mL (ref 211–911)

## 2022-07-03 LAB — TSH: TSH: 4.71 u[IU]/mL (ref 0.35–5.50)

## 2022-07-03 MED ORDER — LISINOPRIL 20 MG PO TABS: 20.0000 mg | ORAL_TABLET | Freq: Every day | ORAL | 1 refills | Status: DC

## 2022-07-03 MED ORDER — MEPILEX BORDER FLEX EX PADS: 1.0000 | MEDICATED_PAD | Freq: Every day | CUTANEOUS | 5 refills | Status: DC

## 2022-07-03 NOTE — Progress Notes (Unsigned)
est

## 2022-07-03 NOTE — Patient Instructions (Addendum)
Try donut cushion at home to relieve the pressure on the tailbone  Try the Silicone foam dressing or Mepilex dressings on the area to help provide cushioning.  Increase your Lisinopril to 20 mg daily (may take 2 tablets of the 10 mg pills you already have).

## 2022-07-03 NOTE — Progress Notes (Unsigned)
Established Patient Office Visit  Subjective   Patient ID: Linda Lucero, female    DOB: 17-Mar-1928  Age: 88 y.o. MRN: [id number]  Chief Complaint  Patient presents with   Establish Care    Patient is here for transition of care visit  HTN-- BP checked in office and is elevated, she reports increasing fatigue and generalized weakness. Currently taking lisinopril 10 mg daily and hydralazine 25 mg TID. She reports a long standing history of HTN, no chest pain, no additional SOB, no dizziness. Reports compliance with her BP meds. She does have a BP cuff at home and checks it on occasion.  B12 deficiency -- patient is on 1000 mcg supplement daily,   Patient reports that she had COVID in February 2023, states that she was given the paxlovid and she finished it however she has had persistent mucus drainage ever since. No fever/chills, no sinus pressure or pain.   Patient is also reporting that she has lost weight and that she is having some pain in her tailbone. States that she has a small sore on her bottom. States it is not an open wound, not having any drainage from the area. She reports that someone gave her a donut cushion to use at home but has not used it yet. States she is trying to increase her calories at home with supplement shakes.  CKD stage 3B-- last GFR and Cr were reviewed and appeared slightly better than previous values. She reports she does not have a nephrologist yet. She reports she is urinating a normal amount, no increase in leg swelling and no increase in SOB. I reviewed her CBC which does show a chronic anemia and very low MCV, her iron level has not been checked in some time. We discussed the nature of this anemia and that it might be contributing to her tiredness/weakness.   Current Outpatient Medications  Medication Instructions   Blood Pressure Monitoring (COMFORT TOUCH BP CUFF/LARGE) MISC 1 Device, Does not apply, Daily PRN   Cyanocobalamin (B-12 PO) 1,000 mcg,  Oral, Daily   feeding supplement (ENSURE ENLIVE / ENSURE PLUS) LIQD 237 mLs, Oral, 2 times daily between meals   furosemide (LASIX) 20 mg, Oral, Daily PRN   hydrALAZINE (APRESOLINE) 25 MG tablet TAKE 1 TABLET BY MOUTH THREE TIMES A DAY   levothyroxine (SYNTHROID) 75 mcg, Oral, Daily   lisinopril (ZESTRIL) 20 mg, Oral, Daily   Wound Dressings (MEPILEX BORDER FLEX) PADS 1 Pad, Apply externally, Daily    Patient Active Problem List   Diagnosis Date Noted   B12 deficiency 07/03/2022   Malnutrition of moderate degree 03/28/2021   CHF (congestive heart failure) (HCC) 03/25/2021   Acute on chronic diastolic CHF (congestive heart failure) (HCC)    Elevated troponin    Acute CHF (HCC) 03/24/2021   Coronary artery disease    Low back pain 09/06/2018   Hypertension 09/06/2018   Chronic kidney disease, stage 3a (HCC) 09/06/2018   Hypothyroidism 09/06/2018      Review of Systems  Constitutional:  Positive for malaise/fatigue and weight loss. Negative for chills and fever.  HENT:  Negative for congestion.   Eyes:  Negative for blurred vision.  Respiratory:  Positive for cough. Negative for shortness of breath.   Cardiovascular:  Negative for chest pain and leg swelling.  Gastrointestinal:  Negative for abdominal pain and diarrhea.  Neurological:  Negative for dizziness and headaches.      Objective:     BP (!) 156/76 (BP  Location: Right Arm, Cuff Size: Large)   Pulse 75   Temp 98 F (36.7 C) (Oral)   Ht 5\' 2"  (1.575 m)   Wt 147 lb 14.4 oz (67.1 kg)   LMP  (LMP Unknown)   SpO2 97%   BMI 27.05 kg/m  BP Readings from Last 3 Encounters:  07/03/22 (!) 156/76  03/03/22 (!) 186/60  09/02/21 140/60      Physical Exam Vitals reviewed.  Neck:     Thyroid: No thyromegaly.  Cardiovascular:     Rate and Rhythm: Normal rate and regular rhythm.  Pulmonary:     Effort: Pulmonary effort is normal.     Breath sounds: Normal breath sounds. No wheezing, rhonchi or rales.  Abdominal:      General: Bowel sounds are normal.     Palpations: Abdomen is soft.  Musculoskeletal:     Cervical back: Normal range of motion and neck supple.  Skin:    General: Skin is warm and dry.     Findings: Signs of injury present.          Comments: Area of stage 1 skin breakdown on the left buttock near the saccrum. There is no open wound present.       Last CBC Lab Results  Component Value Date   WBC 7.9 03/03/2022   HGB 9.8 (L) 03/03/2022   HCT 30.2 (L) 03/03/2022   MCV 63.3 Repeated and verified X2. (L) 03/03/2022   MCH 20.5 (L) 03/28/2021   RDW 19.7 (H) 03/03/2022   PLT 316.0 03/03/2022   Last metabolic panel Lab Results  Component Value Date   GLUCOSE 87 03/03/2022   NA 139 03/03/2022   K 3.8 03/03/2022   CL 105 03/03/2022   CO2 25 03/03/2022   BUN 23 03/03/2022   CREATININE 1.23 (H) 03/03/2022   GFRNONAA 33 (L) 03/29/2021   CALCIUM 9.6 03/03/2022   PHOS 4.4 03/26/2021   PROT 7.8 03/03/2022   ALBUMIN 4.2 03/03/2022   BILITOT 0.3 03/03/2022   ALKPHOS 77 03/03/2022   AST 20 03/03/2022   ALT 11 03/03/2022   ANIONGAP 5 03/29/2021   Last vitamin D No results found for: "25OHVITD2", "25OHVITD3", "VD25OH"      The ASCVD Risk score (Arnett DK, et al., 2019) failed to calculate for the following reasons:   The 2019 ASCVD risk score is only valid for ages 28 to 49    Assessment & Plan:   Problem List Items Addressed This Visit       Cardiovascular and Mediastinum   Hypertension - Primary    BP currently uncontrolled. I reviewed her medications and recommend her lisinopril be increased to 20 mg daily and she will continue to check her BP daily at home. She will also continue the 25 mg TID of the hydralazine for now. I will see her back for BP recheck in 2 months.       Relevant Medications   lisinopril (ZESTRIL) 20 MG tablet   Acute on chronic diastolic CHF (congestive heart failure) (HCC)    Reviewed her last ECHO which showed EF of 55-60% with grade 1  diastolic dysfunction. Pt reports no increase in leg swelling at this time. Continue lasix 20 mg daily as needed.      Relevant Medications   lisinopril (ZESTRIL) 20 MG tablet     Endocrine   Hypothyroidism    Last TSH was reviewed and was slightly elevated, this needs to be repeated to determine if her 75 mcg  dose needs to be increased. TSH ordered.      Relevant Orders   TSH (Completed)     Genitourinary   Chronic kidney disease, stage 3a (HCC)    Patient currently does not have a nephrologist, I do believe that her anemia of chronic disease is associated with her CKD, I am ordering iron studies to rule out ion deficiency and rechecking her B12 level to make sure it  Is being replaced appropriately. If her B12 is normal and her iron levels are normal then she may benefit from a referral to the nephrologist or hematologist to help with her anemia.        Other   B12 deficiency   Relevant Orders   Vitamin B12 (Completed)   Other Visit Diagnoses     Anemia due to stage 3b chronic kidney disease (HCC)       Relevant Medications   Cyanocobalamin (B-12 PO)   Other Relevant Orders   Iron, TIBC and Ferritin Panel (Completed)   Pressure injury of left buttock, stage 1       Relevant Medications   Wound Dressings (MEPILEX BORDER FLEX) PADS  Patient was instructed on how to relieve the pressure on her buttock, she was instructed to use the mepilex padded bandages and to use the donut cushion she already has and I instructed her to switch positions every 2 hours. I will recheck this area in 2 months at the next visit.     Return in about 2 months (around 09/02/2022) for follow up blood presure and thyroid .    Karie Georges, MD

## 2022-07-04 LAB — IRON,TIBC AND FERRITIN PANEL
%SAT: 14 % (calc) — ABNORMAL LOW (ref 16–45)
Ferritin: 15 ng/mL — ABNORMAL LOW (ref 16–288)
Iron: 50 ug/dL (ref 45–160)
TIBC: 345 mcg/dL (calc) (ref 250–450)

## 2022-07-04 NOTE — Assessment & Plan Note (Signed)
Patient currently does not have a nephrologist, I do believe that her anemia of chronic disease is associated with her CKD, I am ordering iron studies to rule out ion deficiency and rechecking her B12 level to make sure it  Is being replaced appropriately. If her B12 is normal and her iron levels are normal then she may benefit from a referral to the nephrologist or hematologist to help with her anemia.

## 2022-07-04 NOTE — Progress Notes (Signed)
Labs are better, her iron levels are relatively good so her anemia is likely related to her kidney disease. TSH is normal. B 12 is normal

## 2022-07-04 NOTE — Assessment & Plan Note (Signed)
Reviewed her last ECHO which showed EF of 55-60% with grade 1 diastolic dysfunction. Pt reports no increase in leg swelling at this time. Continue lasix 20 mg daily as needed.

## 2022-07-04 NOTE — Assessment & Plan Note (Signed)
Last TSH was reviewed and was slightly elevated, this needs to be repeated to determine if her 75 mcg dose needs to be increased. TSH ordered.

## 2022-07-04 NOTE — Assessment & Plan Note (Signed)
BP currently uncontrolled. I reviewed her medications and recommend her lisinopril be increased to 20 mg daily and she will continue to check her BP daily at home. She will also continue the 25 mg TID of the hydralazine for now. I will see her back for BP recheck in 2 months.

## 2022-07-06 ENCOUNTER — Ambulatory Visit: Payer: Self-pay | Admitting: Licensed Clinical Social Worker

## 2022-07-06 DIAGNOSIS — I509 Heart failure, unspecified: Secondary | ICD-10-CM

## 2022-07-07 NOTE — Patient Outreach (Signed)
  Care Coordination   Follow Up Visit Note   07/07/2022 Name: Linda Lucero MRN: [id number] DOB: Mar 02, 1928  Linda Lucero is a 88 y.o. year old female who sees Karie Georges, MD for primary care. I spoke with  Linda Lucero's son, Linda Lucero, by phone today.  What matters to the patients health and wellness today?  Agencies to assist with cleaning senior's homes    Goals Addressed             This Visit's Progress    Find Help in My Community   On track    Care Coordination Interventions: Active listening / Reflection utilized  Pt's son shared that pt is receiving MOW three times a week (M,W,Th) and enjoys services Pt is still interested in information regarding community agencies that assist with cleaning. LCSW informed him that services would be an out of pocket expense. Will complete Care Guide referral for assistance Family are requesting continued Care Coordination        SDOH assessments and interventions completed:  No     Care Coordination Interventions Activated:  Yes  Care Coordination Interventions:  Yes, provided   Follow up plan: Follow up call scheduled for six weeks    Encounter Outcome:  Pt. Visit Completed   Jenel Lucks, MSW, LCSW [facility] Care Management [facility]  [facility] [email or web address] Phone [phone] 2:53 PM

## 2022-07-07 NOTE — Patient Instructions (Signed)
Visit Information  Thank you for taking time to visit with me today. Please don't hesitate to contact me if I can be of assistance to you.   Following are the goals we discussed today:   Goals Addressed             This Visit's Progress    Find Help in My Community   On track    Care Coordination Interventions: Active listening / Reflection utilized  Pt's son shared that pt is receiving MOW three times a week (M,W,Th) and enjoys services Pt is still interested in information regarding community agencies that assist with cleaning. LCSW informed him that services would be an out of pocket expense. Will complete Care Guide referral for assistance Family are requesting continued Care Coordination        Our next appointment is by telephone on 08/17/22 at 10 AM  Please call the care guide team at [phone] if you need to cancel or reschedule your appointment.   If you are experiencing a Mental Health or Behavioral Health Crisis or need someone to talk to, please call the Suicide and Crisis Lifeline: 988 call 911   Patient verbalizes understanding of instructions and care plan provided today and agrees to view in MyChart. Active MyChart status and patient understanding of how to access instructions and care plan via MyChart confirmed with patient.     Jenel Lucks, MSW, LCSW [facility] Care Managment [place]  [facility] [email or web address] Phone [phone] 2:53 PM

## 2022-07-11 ENCOUNTER — Telehealth: Payer: Self-pay | Admitting: *Deleted

## 2022-07-11 NOTE — Telephone Encounter (Signed)
   Telephone encounter was:  Unsuccessful.  07/11/2022 Name: Linda Lucero MRN: [id number] DOB: 22-Jun-1928  Unsuccessful outbound call made today to assist with:   home cleaning   Outreach Attempt:  1st Attempt  A HIPAA compliant voice message was left requesting a return call.  Instructed patient to call back at [phone].  [name] -[facility] , Population Health [phone] [address] [place] , [place] [address] Email : [email or web address]

## 2022-07-13 ENCOUNTER — Telehealth: Payer: Self-pay | Admitting: *Deleted

## 2022-07-13 NOTE — Telephone Encounter (Signed)
   Telephone encounter was:  Unsuccessful.  07/13/2022 Name: Linda Lucero MRN: [id number] DOB: May 04, 1928  Unsuccessful outbound call made today to assist with:   Cleaning  Outreach Attempt:  2nd Attempt  A HIPAA compliant voice message was left requesting a return call.  Instructed patient to call back at [phone]. [name] -[facility] [place], Population Health [phone] [address] [place] , [place] [address] Email : [email or web address]

## 2022-07-17 ENCOUNTER — Telehealth: Payer: Self-pay | Admitting: *Deleted

## 2022-07-17 NOTE — Telephone Encounter (Signed)
   Telephone encounter was:  Unsuccessful.  07/17/2022 Name: Linda Lucero MRN: [id number] DOB: November 09, 1927  Unsuccessful outbound call made today to assist with:   Cleaning   Outreach Attempt:  3rd Attempt.  Referral closed unable to contact patient.  A HIPAA compliant voice message was left requesting a return call.  Instructed patient to call back at [phone].  [name] -[facility], Population Health [phone] [address] [place] , [place] [address] Email : [email or web address]

## 2022-07-28 ENCOUNTER — Ambulatory Visit: Payer: Medicare HMO | Admitting: Podiatry

## 2022-08-07 ENCOUNTER — Ambulatory Visit: Payer: Medicare HMO | Admitting: Podiatry

## 2022-08-11 ENCOUNTER — Telehealth: Payer: Self-pay | Admitting: Pharmacist

## 2022-08-11 NOTE — Chronic Care Management (AMB) (Addendum)
Chronic Care Management Pharmacy Assistant   Name: Linda Lucero  MRN: [id number] DOB: 1928/04/11  Reason for Encounter: Disease State / Hypertension Assessment Call   Conditions to be addressed/monitored: HTN  Recent office visits:  07/03/2022 Nira Conn MD - Patient was seen for primary hypertension and additional issues. Discontinued Miralax. Follow up in 2 months.   05/26/2022 Theresa Mulligan LPN - Medicare annual wellness exam  Recent consult visits:  None  Hospital visits:  None  Medications: Outpatient Encounter Medications as of 08/11/2022  Medication Sig   Blood Pressure Monitoring (COMFORT TOUCH BP CUFF/LARGE) MISC 1 Device by Does not apply route daily as needed.   Cyanocobalamin (B-12 PO) Take 1,000 mcg by mouth daily.   feeding supplement (ENSURE ENLIVE / ENSURE PLUS) LIQD Take 237 mLs by mouth 2 (two) times daily between meals.   furosemide (LASIX) 40 MG tablet Take 0.5 tablets (20 mg total) by mouth daily as needed for fluid or edema (3lbs weight gain in 24 hours).   hydrALAZINE (APRESOLINE) 25 MG tablet TAKE 1 TABLET BY MOUTH THREE TIMES A DAY   levothyroxine (SYNTHROID) 75 MCG tablet Take 1 tablet (75 mcg total) by mouth daily.   lisinopril (ZESTRIL) 20 MG tablet Take 1 tablet (20 mg total) by mouth daily.   Wound Dressings (MEPILEX BORDER FLEX) PADS Apply 1 Pad topically daily.   No facility-administered encounter medications on file as of 08/11/2022.  Fill History:  FUROSEMIDE 40MG  TAB 06/23/2021 90   hydralazine 25 mg tablet 03/12/2022 90   HYDROCHLOROT TAB 61M 08/18/2020 90   levothyroxine 75 mcg tablet 06/07/2022 90   LISINOPRIL 20 MG TABLET 07/03/2022 90   Reviewed chart prior to disease state call. Spoke with patient regarding BP  Recent Office Vitals: BP Readings from Last 3 Encounters:  07/03/22 (!) 156/76  03/03/22 (!) 186/60  09/02/21 140/60   Pulse Readings from Last 3 Encounters:  07/03/22 75  03/03/22 75  09/02/21 65     Wt Readings from Last 3 Encounters:  07/03/22 147 lb 14.4 oz (67.1 kg)  05/26/22 152 lb (68.9 kg)  03/03/22 152 lb (68.9 kg)     Kidney Function Lab Results  Component Value Date/Time   CREATININE 1.23 (H) 03/03/2022 02:29 PM   CREATININE 1.56 (H) 04/20/2021 03:06 PM   CREATININE 1.49 (H) 08/20/2020 02:26 PM   CREATININE 1.98 (H) 08/11/2020 03:19 PM   GFR 38.42 (L) 03/03/2022 02:29 PM   GFRNONAA 33 (L) 03/29/2021 01:44 AM   GFRAA 40 (L) 05/07/2020 07:58 PM       Latest Ref Rng & Units 03/03/2022    2:29 PM 04/20/2021    3:06 PM 03/29/2021    1:44 AM  BMP  Glucose 70 - 99 mg/dL 87  03/31/2021  99   BUN 6 - 23 mg/dL 23  27  32   Creatinine 0.40 - 1.20 mg/dL 381  0.17  5.10   Sodium 135 - 145 mEq/L 139  135  134   Potassium 3.5 - 5.1 mEq/L 3.8  4.3  4.3   Chloride 96 - 112 mEq/L 105  102  101   CO2 19 - 32 mEq/L 25  23  28    Calcium 8.4 - 10.5 mg/dL 9.6  9.8  9.2     Current antihypertensive regimen:  Lisinopril 10 mg daily Hydralazine 25 mg three times daily  How often are you checking your Blood Pressure? Patient is checking her blood pressures daily.  Current home BP  readings: Spoke with patients son, he states her blood pressure yesterday was 150/50, he doesn't recall any recent readings however, states they are always lower at home as compared to doctors visits.   What recent interventions/DTPs have been made by any provider to improve Blood Pressure control since last CPP Visit: No recent interventions  Any recent hospitalizations or ED visits since last visit with CPP? No recent hospital visits.   What diet changes have been made to improve Blood Pressure Control?  Patient follows no specific diet Breakfast - patient will have a hot cereal, something easy to chew Lunch - patient will have meals on wheels Mon, Wed and Fri, her son will pick up something on other days Dinner - patient will have a soft meal containing a meat and vegetable.   What exercise is being done  to improve your Blood Pressure Control?  Patients son will take her shopping twice weekly and she will walk in the stores. Patient will walk around the house during the day.   Adherence Review: Is the patient currently on ACE/ARB medication? Yes Does the patient have >5 day gap between last estimated fill dates? No  Care Gaps: AWV - scheduled 05/31/2023 Last BP - 156/76 on 07/03/2022 Covid - overdue Shingrix - postponed Flu - postponed  Star Rating Drugs: Lisinopril 20 mg - last filled 07/03/2022 90 DS at [place] Pharmacist Assistant [phone]

## 2022-08-17 ENCOUNTER — Ambulatory Visit: Payer: Self-pay | Admitting: Licensed Clinical Social Worker

## 2022-08-17 NOTE — Patient Instructions (Signed)
Visit Information  Thank you for taking time to visit with me today. Please don't hesitate to contact me if I can be of assistance to you.   Following are the goals we discussed today:   Goals Addressed             This Visit's Progress    Find Help in My Community   On track    Care Coordination Interventions: Solution-Focused Strategies employed:  Active listening / Reflection utilized  LCSW informed pt's son, Taquilla Downum, that Care Guide had difficulty reaching patient regarding referral for in-home cleaning assistance. Mr. Feimster shared that pt endorses continued frustration and stress from solicitors calling her phone; therefore, she does not answer unknown numbers LCSW messaged previous care guide to re-open previous referral. Mr. Pete encourages staff to utilize his phone number and leave a detailed message LCSW reviewed upcoming appts. Pt has stable transportation to medical appts           Our next appointment is by telephone on 09/21/22 at 10 AM  Please call the care guide team at [phone] if you need to cancel or reschedule your appointment.   If you are experiencing a Mental Health or [place] or need someone to talk to, please call the Suicide and Crisis Lifeline: 988 call 911   Patient verbalizes understanding of instructions and care plan provided today and agrees to view in [place]. Active MyChart status and patient understanding of how to access instructions and care plan via MyChart confirmed with patient.     Christa See, MSW, [email or web address] Phone [phone] 10:26 AM

## 2022-08-17 NOTE — Patient Outreach (Signed)
  Care Coordination   Follow Up Visit Note   08/17/2022 Name: Linda Lucero MRN: [id number] DOB: Jul 14, 1928  Linda Lucero is a 88 y.o. year old female who sees Farrel Conners, MD for primary care. I spoke with  Linda Lucero by phone today.  What matters to the patients health and wellness today?  Preferred method of contact and referral to assist with cleaning home    Goals Addressed             This Visit's Progress    Find Help in My Community   On track    Care Coordination Interventions: Solution-Focused Strategies employed:  Active listening / Reflection utilized  LCSW informed pt's son, Leeandra Ellerson, that Care Guide had difficulty reaching patient regarding referral for in-home cleaning assistance. Mr. Canion shared that pt endorses continued frustration and stress from solicitors calling her phone; therefore, she does not answer unknown numbers LCSW messaged previous care guide to re-open previous referral. Mr. Seguin encourages staff to utilize his phone number and leave a detailed message LCSW reviewed upcoming appts. Pt has stable transportation to medical appts           SDOH assessments and interventions completed:  Yes  SDOH Interventions Today    Flowsheet Row Most Recent Value  SDOH Interventions   Transportation Interventions Intervention Not Indicated        Care Coordination Interventions Activated:  Yes  Care Coordination Interventions:  Yes, provided   Follow up plan: Follow up call scheduled for 4-6 weeks    Encounter Outcome:  Pt. Visit Completed   Christa See, MSW, [email or web address] Phone [phone] 10:24 AM

## 2022-08-18 ENCOUNTER — Telehealth: Payer: Self-pay | Admitting: *Deleted

## 2022-08-18 NOTE — Progress Notes (Signed)
  Jasmine   Will try to reach son times 1 but here are no resources except a agency list there are no programs to pay for in home care or cleaning if the patient is on Medicare . I also leave a message if possible .   Thank you

## 2022-09-04 ENCOUNTER — Ambulatory Visit: Payer: Medicare HMO | Admitting: Family Medicine

## 2022-09-05 ENCOUNTER — Ambulatory Visit (INDEPENDENT_AMBULATORY_CARE_PROVIDER_SITE_OTHER): Payer: Medicare HMO | Admitting: Family Medicine

## 2022-09-05 ENCOUNTER — Encounter: Payer: Self-pay | Admitting: Family Medicine

## 2022-09-05 VITALS — BP 138/92 | HR 70 | Temp 98.5°F | Ht 62.0 in | Wt 152.4 lb

## 2022-09-05 DIAGNOSIS — N1831 Chronic kidney disease, stage 3a: Secondary | ICD-10-CM | POA: Diagnosis not present

## 2022-09-05 DIAGNOSIS — Z23 Encounter for immunization: Secondary | ICD-10-CM

## 2022-09-05 DIAGNOSIS — J310 Chronic rhinitis: Secondary | ICD-10-CM | POA: Diagnosis not present

## 2022-09-05 DIAGNOSIS — E890 Postprocedural hypothyroidism: Secondary | ICD-10-CM | POA: Diagnosis not present

## 2022-09-05 DIAGNOSIS — I10 Essential (primary) hypertension: Secondary | ICD-10-CM

## 2022-09-05 LAB — BASIC METABOLIC PANEL
BUN: 26 mg/dL — ABNORMAL HIGH (ref 6–23)
CO2: 26 mEq/L (ref 19–32)
Calcium: 9.6 mg/dL (ref 8.4–10.5)
Chloride: 106 mEq/L (ref 96–112)
Creatinine, Ser: 1.32 mg/dL — ABNORMAL HIGH (ref 0.40–1.20)
GFR: 35.17 mL/min — ABNORMAL LOW (ref >60.00)
Glucose, Bld: 106 mg/dL — ABNORMAL HIGH (ref 70–99)
Potassium: 3.8 mEq/L (ref 3.5–5.1)
Sodium: 139 mEq/L (ref 135–145)

## 2022-09-05 MED ORDER — COMFORT TOUCH BP CUFF/LARGE MISC: 1.0000 | Freq: Every day | 0 refills | Status: DC | PRN

## 2022-09-05 MED ORDER — HYDRALAZINE HCL 25 MG PO TABS: 25.0000 mg | ORAL_TABLET | Freq: Three times a day (TID) | ORAL | 1 refills | Status: DC

## 2022-09-05 MED ORDER — LEVOTHYROXINE SODIUM 75 MCG PO TABS: 75.0000 ug | ORAL_TABLET | Freq: Every day | ORAL | 1 refills | Status: DC

## 2022-09-05 MED ORDER — CETIRIZINE HCL 10 MG PO TABS: 10.0000 mg | ORAL_TABLET | Freq: Every day | ORAL | 11 refills | Status: DC

## 2022-09-05 NOTE — Assessment & Plan Note (Signed)
BP is much improved today, less than 140, will continue the hydralazine 25 mg TID and the lisinopril 20 mg daily. Will order pt a new BP cuff. RTC in 6 months

## 2022-09-05 NOTE — Assessment & Plan Note (Signed)
Reviewed kidney function, she needs a new BMP for surveillance due to the increased dose of the lisinopril.

## 2022-09-05 NOTE — Progress Notes (Signed)
Established Patient Office Visit  Subjective   Patient ID: Linda Lucero, female    DOB: 1928-03-25  Age: 88 y.o. MRN: [id number]  Chief Complaint  Patient presents with   Follow-up    Patient is here for follow up on her HTN. Pt reports that she thinks it is getting better on the increased dose. She denies headaches, no blurry vision or chest pain. She needs refills on her hydralazine today. States that she doesn't take the furosemide much-- only if she gains 3 pounds in 1 day.   CKD stage 3-- pt reports that she is urinating a normal amount. We reviewed her last lab value. Since we increased her lisinopril from the last visit will recheck Cr today.   Chronic sinus drainage-- pt reports that she has not had improvement in her chronic sinus drainage. States that it is thick and copious in the morning mostly, tends to clear up by noon. We discussed starting an antihistamine and she is agreeable to try it.    Current Outpatient Medications  Medication Instructions   Blood Pressure Monitoring (COMFORT TOUCH BP CUFF/LARGE) MISC 1 Device, Does not apply, Daily PRN   cetirizine (ZYRTEC) 10 mg, Oral, Daily   Cyanocobalamin (B-12 PO) 1,000 mcg, Oral, Daily   feeding supplement (ENSURE ENLIVE / ENSURE PLUS) LIQD 237 mLs, Oral, 2 times daily between meals   furosemide (LASIX) 20 mg, Oral, Daily PRN   hydrALAZINE (APRESOLINE) 25 mg, Oral, 3 times daily   levothyroxine (SYNTHROID) 75 mcg, Oral, Daily   lisinopril (ZESTRIL) 20 mg, Oral, Daily   Wound Dressings (MEPILEX BORDER FLEX) PADS 1 Pad, Apply externally, Daily    Patient Active Problem List   Diagnosis Date Noted   B12 deficiency 07/03/2022   Malnutrition of moderate degree 03/28/2021   CHF (congestive heart failure) (HCC) 03/25/2021   Acute on chronic diastolic CHF (congestive heart failure) (HCC)    Elevated troponin    Acute CHF (HCC) 03/24/2021   Coronary artery disease    Low back pain 09/06/2018   Hypertension 09/06/2018    Chronic kidney disease, stage 3a (HCC) 09/06/2018   Hypothyroidism 09/06/2018      Review of Systems  All other systems reviewed and are negative.     Objective:     BP (!) 138/92 (BP Location: Left Arm, Patient Position: Sitting, Cuff Size: Large)   Pulse 70   Temp 98.5 F (36.9 C) (Oral)   Ht 5\' 2"  (1.575 m)   Wt 152 lb 6.4 oz (69.1 kg)   LMP  (LMP Unknown)   SpO2 100%   BMI 27.87 kg/m    Physical Exam Vitals reviewed.  Constitutional:      Appearance: Normal appearance. She is well-groomed and normal weight.  Eyes:     Conjunctiva/sclera: Conjunctivae normal.  Cardiovascular:     Rate and Rhythm: Normal rate and regular rhythm.     Pulses: Normal pulses.     Heart sounds: S1 normal and S2 normal.  Pulmonary:     Effort: Pulmonary effort is normal.     Breath sounds: Normal breath sounds and air entry.  Abdominal:     General: Bowel sounds are normal.  Musculoskeletal:     Right lower leg: No edema.     Left lower leg: No edema.  Neurological:     Mental Status: She is alert and oriented to person, place, and time. Mental status is at baseline.     Gait: Gait is intact.  Psychiatric:  Mood and Affect: Mood and affect normal.        Speech: Speech normal.        Behavior: Behavior normal.        Judgment: Judgment normal.      No results found for any visits on 09/05/22.    The ASCVD Risk score (Arnett DK, et al., 2019) failed to calculate for the following reasons:   The 2019 ASCVD risk score is only valid for ages 33 to 69    Assessment & Plan:   Problem List Items Addressed This Visit       Cardiovascular and Mediastinum   Hypertension - Primary    BP is much improved today, less than 140, will continue the hydralazine 25 mg TID and the lisinopril 20 mg daily. Will order pt a new BP cuff. RTC in 6 months      Relevant Medications   Blood Pressure Monitoring (COMFORT TOUCH BP CUFF/LARGE) MISC   hydrALAZINE (APRESOLINE) 25 MG  tablet     Endocrine   Hypothyroidism   Relevant Medications   levothyroxine (SYNTHROID) 75 MCG tablet     Genitourinary   Chronic kidney disease, stage 3a ([place])    Reviewed kidney function, she needs a new BMP for surveillance due to the increased dose of the lisinopril.      Relevant Orders   Basic Metabolic Panel   Other Visit Diagnoses     Chronic rhinitis       Relevant Medications   Continued chronic sinus drainage, will start antihistamine at bedtime. Zyrtec 10 mg daily.   cetirizine (ZYRTEC) 10 MG tablet   Need for immunization against influenza       Relevant Orders   Flu Vaccine QUAD High Dose(Fluad) (Completed)       Return in about 6 months (around 03/06/2023) for follow up HTN.    Farrel Conners, MD

## 2022-09-07 NOTE — Progress Notes (Signed)
Kidney function appears stable, will continue to monitor every 6 months

## 2022-09-12 IMAGING — CR DG CHEST 1V
1 series · 2 of 2 positions shown · non-contrast
Comparison: 08/22/2017

CLINICAL DATA: Shortness of breath

EXAM:
CHEST  1 VIEW

[Series 2: chest ap · 0.14mm/px · 2 of 2 slices shown]
[im 1/2]
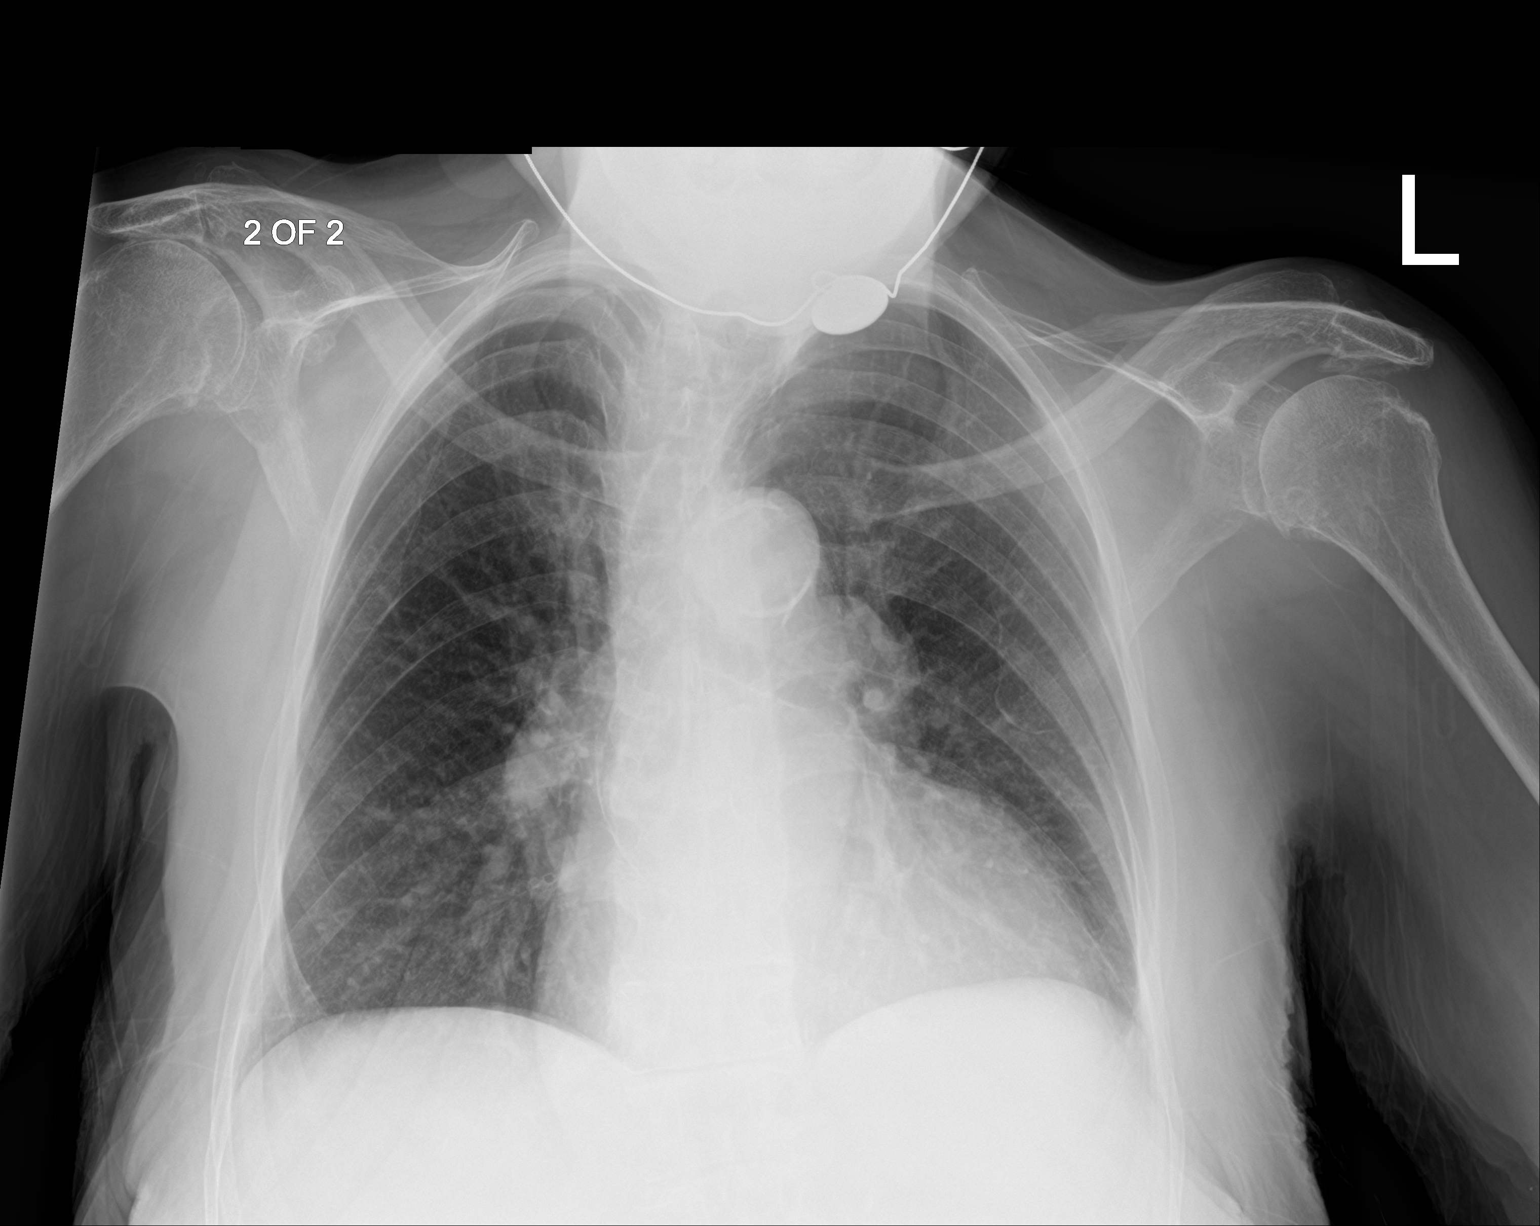
[im 2/2]
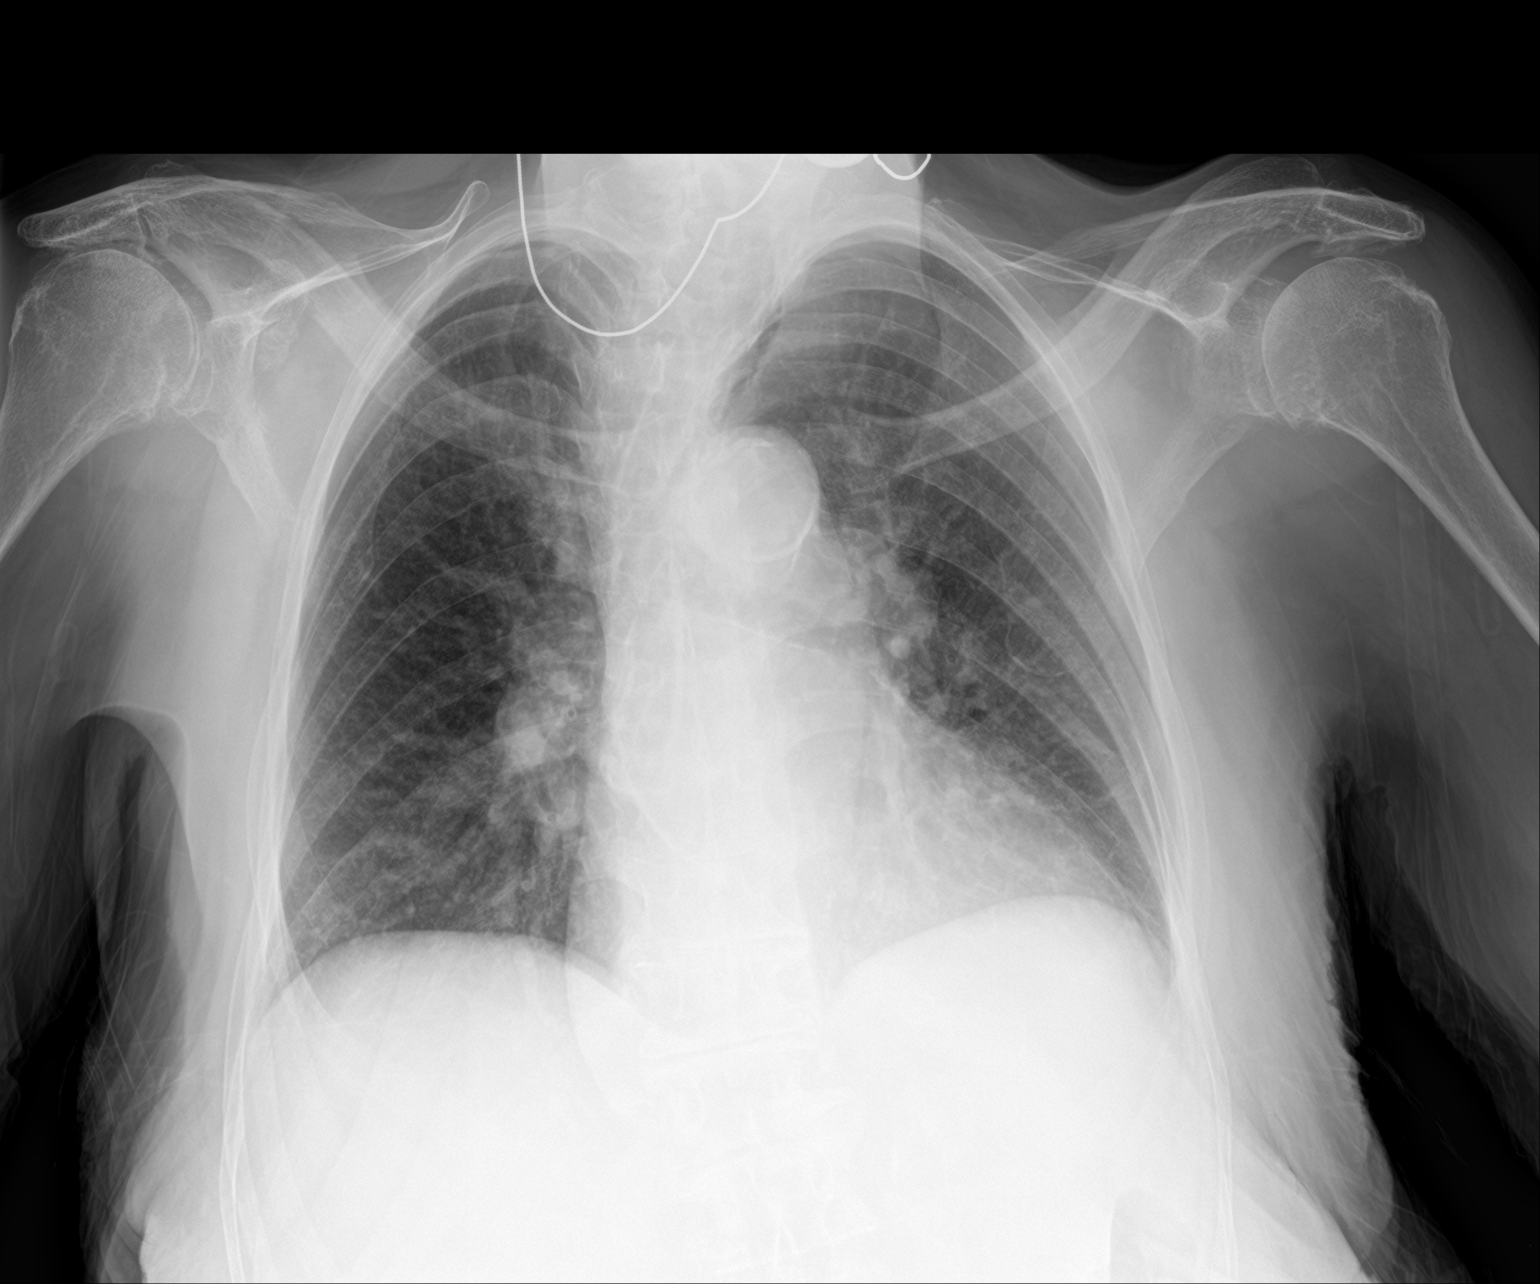

[2 of 2 positions shown; findings below may reference images not displayed]

FINDINGS: Mild cardiomegaly with vascular congestion. Aortic atherosclerosis.
No consolidation or significant effusion. No pneumothorax.
IMPRESSION: No active disease.  Cardiomegaly with vascular congestion

## 2022-09-21 ENCOUNTER — Telehealth: Payer: Self-pay | Admitting: Pharmacist

## 2022-09-21 ENCOUNTER — Ambulatory Visit: Payer: Self-pay | Admitting: Licensed Clinical Social Worker

## 2022-09-21 NOTE — Chronic Care Management (AMB) (Signed)
Call to patient due to scheduling conflict with Gaylord Shih, did not reach left her a voicemail to advise her phone call had been moved up a day to the 28 th along with my contact information in the event this does not work with her schedule.   Pamala Duffel CMA Clinical Pharmacist Assistant [phone]

## 2022-09-22 NOTE — Patient Instructions (Signed)
Visit Information  Thank you for taking time to visit with me today. Please don't hesitate to contact me if I can be of assistance to you.   Following are the goals we discussed today:   Goals Addressed             This Visit's Progress    Find Help in My Community   On track    Care Coordination Interventions: Solution-Focused Strategies employed:  Active listening / Reflection utilized  Emotional Support Provided Verbalization of feelings encouraged  Patient reports she is doing well  Patient was provided new medications at recent PCP appt. Pt reports med compliance resulting in a decrease in thick mucus, stating "It's much less" Pt reports difficulty obtaining a blood pressure cuff. She has went to multiple stores that do not have them in stock. LCSW will contact leadership and request one is mailed to pt's residence to assist with self-monitoring hypertension Reviewed upcoming appts          Our next appointment is by telephone on 11/16/22 at 10 AM  Please call the care guide team at [phone] if you need to cancel or reschedule your appointment.   If you are experiencing a Mental Health or Behavioral Health Crisis or need someone to talk to, please call the Suicide and Crisis Lifeline: 988 call 911   Patient verbalizes understanding of instructions and care plan provided today and agrees to view in MyChart. Active MyChart status and patient understanding of how to access instructions and care plan via MyChart confirmed with patient.     Jenel Lucks, MSW, LCSW [facility] Care Management [facility]  [facility] [email or web address] Phone [phone] 5:44 AM

## 2022-09-22 NOTE — Patient Outreach (Addendum)
  Care Coordination   Follow Up Visit Note   09/22/2022 Name: Linda Lucero MRN: [id number] DOB: 11/28/1927  Linda Lucero is a 88 y.o. year old female who sees Karie Georges, MD for primary care. I spoke with  Linda Lucero by phone today.  What matters to the patients health and wellness today?  Management of Hypertension    Goals Addressed             This Visit's Progress    Find Help in My Community   On track    Care Coordination Interventions: Solution-Focused Strategies employed:  Active listening / Reflection utilized  Emotional Support Provided Verbalization of feelings encouraged  Patient reports she is doing well  Patient was provided new medications at recent PCP appt. Pt reports med compliance resulting in a decrease in thick mucus, stating "It's much less" Pt reports difficulty obtaining a blood pressure cuff. She has went to multiple stores that do not have them in stock. LCSW will contact leadership and request one is mailed to pt's residence to assist with self-monitoring hypertension Reviewed upcoming appts          SDOH assessments and interventions completed:  No     Care Coordination Interventions Activated:  Yes  Care Coordination Interventions:  Yes, provided   Follow up plan: Follow up call scheduled for 6-8 weeks    Encounter Outcome:  Pt. Visit Completed   Jenel Lucks, MSW, LCSW [facility] Care Management [facility]  [facility] [email or web address] Phone [phone] 5:44 AM

## 2022-10-02 ENCOUNTER — Telehealth: Payer: Self-pay | Admitting: Pharmacist

## 2022-10-02 NOTE — Chronic Care Management (AMB) (Signed)
    Chronic Care Management Pharmacy Assistant   Name: Linda Lucero  MRN: [id number] DOB: 07-03-1928  10/03/2022 APPOINTMENT REMINDER  [name] was reminded to have all medications, supplements and any blood glucose and blood pressure readings available for review with Gaylord Shih, Pharm. D, at her telephone visit on 10/03/2022 at 2:00.  Care Gaps: AWV - scheduled 05/31/2023 Last BP - 138/92 on 09/05/2022 Covid - overdue Shingrix - postponed  Star Rating Drug: Lisinopril 20 mg - last filled 07/03/2022 90 DS at [facility]   Any gaps in medications fill history? No  Inetta Fermo [facility]  Programme researcher, broadcasting/film/video [phone]

## 2022-10-03 ENCOUNTER — Ambulatory Visit (INDEPENDENT_AMBULATORY_CARE_PROVIDER_SITE_OTHER): Payer: Medicare HMO | Admitting: Pharmacist

## 2022-10-03 DIAGNOSIS — I10 Essential (primary) hypertension: Secondary | ICD-10-CM

## 2022-10-03 DIAGNOSIS — N1831 Chronic kidney disease, stage 3a: Secondary | ICD-10-CM

## 2022-10-03 NOTE — Patient Instructions (Signed)
Hi Linda Lucero,  It was great to catch up again! Please make sure to start checking your blood pressure more regularly at home. Linda Lucero will check in in a few weeks on your readings.   Please reach out to me if you have any questions or need anything!  Best, Linda Lucero  Linda Lucero, PharmD, [facility] Clinical Pharmacist [facility] [phone]   Visit Information   Goals Addressed   None    Patient Care Plan: CCM Pharmacy Care Plan     Problem Identified: Problem: Hypertension, Chronic Kidney Disease, Hypothyroidism, Osteoarthritis and Insomnia      Long-Range Goal: Patient-Specific Goal   Start Date: 12/30/2020  Expected End Date: 12/30/2021  Recent Progress: On track  Priority: High  Note:   Current Barriers:  Unable to independently monitor therapeutic efficacy Unable to achieve control of blood pressure   Pharmacist Clinical Goal(s):  Patient will achieve control of hypertension as evidenced by blood pressure readings  through collaboration with PharmD and provider.   Interventions: 1:1 collaboration with Linda Georges, MD regarding development and update of comprehensive plan of care as evidenced by provider attestation and co-signature Inter-disciplinary care team collaboration (see longitudinal plan of care) Comprehensive medication review performed; medication list updated in electronic medical record  Hypertension (BP goal <140/90) -Uncontrolled -Current treatment: Hydralazine 25 mg three times daily (only takes 2 per day) - Appropriate, Effective, Safe, Accessible Lisinopril 20 mg 1 tablet once daily - Appropriate, Effective, Safe, Accessible -Medications previously tried: HCTZ (side effects), clonidine, losartan (unknown), valsartan -Current home readings:  162/84, 160/80s (wrist cuff - off in office from arm cuff) -Current dietary habits: doesn't eat a lot of salt when cooking and does not eat out; doesn't eat processed foods and drinks 5  glasses of water per day -Current exercise habits: does not sit down all day but no structured exercise; trying to walk around her yard more -Denies hypotensive/hypertensive symptoms -Educated on Importance of home blood pressure monitoring; Proper BP monitoring technique; Symptoms of hypotension and importance of maintaining adequate hydration; -Counseled to monitor BP at home daily document, and provide log at future appointments -Recommended to continue current medication Recommended purchasing Omron BP arm cuff to start checking BP at home again.  Insomnia (Goal: improve quality and quantity of sleep) -Controlled -Current treatment  No medications -Medications previously tried: trazodone -Recommended to continue current medication Counseled on practicing good sleep hygiene by setting a sleep schedule and maintaining it, avoid excessive napping, following a nightly routine, avoiding screen time for 30-60 minutes before going to bed, and making the bedroom a cool, quiet and dark space  Hypothyroidism (Goal: TSH 0.35-4.5) -Controlled -Current treatment  Levothyroxine 75 mcg 1 tablet daily - Appropriate, Effective, Safe, Accessible -Medications previously tried: none -Recommended to continue current medication   Osteoarthritis (Goal: minimize pain associated with arthritis) -Controlled -Current treatment  Diclofenac 1% gel apply as needed - Appropriate, Effective, Safe, Accessible Tylenol as needed - Appropriate, Effective, Safe, Accessible -Medications previously tried: none  -Counseled on risk of bleeding with aspirin and kidney damage Recommended use of Tylenol more often than aspirin   CKD (Goal: prevent further worsening of kidneys) -Controlled -Current treatment  Lisinopril 20 mg 1 tablet daily - Appropriate, Effective, Safe, Accessible -Medications previously tried: losartan, valsartan  -Counseled on avoidance of NSAIDs for worsening kidney function  Allergic rhinitis  (Goal: minimize symptoms) -Controlled -Current treatment  Cetirizine 10 mg 1 tablet daily - Appropriate, Effective, Safe, Accessible -Medications previously tried: none  -Recommended  to continue current medication   Health Maintenance -Vaccine gaps: tetanus -Current therapy:  Zinc 50 mg daily  Vitamin D 1000 units daily  Multivitamin daily  -Educated on Cost vs benefit of each product must be carefully weighed by individual consumer -Patient is satisfied with current therapy and denies issues -Recommended to continue current medication  Patient Goals/Self-Care Activities Patient will:  - take medications as prescribed check blood pressure twice weekly, document, and provide at future appointments  Follow Up Plan: The care management team will reach out to the patient again over the next 30 days.      Patient Care Plan: LCSW Plan of Care     Problem Identified: Quality of Life (General Plan of Care)      Goal: Quality of Life Maintained   Start Date: 03/23/2022  Expected End Date: 06/05/2022  This Visit's Progress: On track  Recent Progress: On track  Priority: High  Note:   Current Barriers:  Food Insecurity  CSW Clinical Goal(s):  Patient  will explore community resource options for unmet needs related to:  Food Insecurity  through collaboration with Visual merchandiser, provider, and care team.  Interventions: Patient's adult son, Linda Lucero, provided all hx Patient has difficulty preparing meals that would positively manage chronic health conditions. LCSW completed care guide referral to assist pt with referral to Meals on Wheels and/or similar resources 6/13: Patient has been referred to Meals on Wheels and is currently on the wait-list. Family agreed to wait approx 6-8 weeks to see if they receive services Patient continues to complete crossword puzzles Inter-disciplinary care team collaboration (see longitudinal plan of care) Evaluation of current treatment  plan related to  self management and patient's adherence to plan as established by provider Review resources, discussed options and provided patient information about  Referral to care guide (Family interested in Meals On Wheels)  Solution-Focused Strategies employed:  Active listening / Reflection utilized  Emotional Support Provided Verbalization of feelings encouraged  Task & activities to accomplish goals: Attend all scheduled provider appts Utilize healthy coping skills and/or supportive resources discussed Contact PCP office with any questions or concerns       Patient verbalizes understanding of instructions and care plan provided today and agrees to view in MyChart. Active MyChart status and patient understanding of how to access instructions and care plan via MyChart confirmed with patient.    The pharmacy team will reach out to the patient again over the next 30 days.   Verner Chol, [facility]

## 2022-10-03 NOTE — Progress Notes (Signed)
Chronic Care Management Pharmacy Note  10/03/2022 Name:  Linda Lucero MRN:  [id number] DOB:  1928/04/07  Summary: Patient is feeling much better but not checking BP at home often Pt is only taking hydralazine BID  Recommendations/Changes made from today's visit: -Recommended every other day BP monitoring at home  -Recommended alternating time of day for checking BP  Plan: BP assessment in 2-3 weeks Follow up depending on BP assessment readings  Subjective: Linda Lucero is an 88 y.o. year old female who is a primary patient of Linda Lucero, Linda Cowper, Lucero.  The CCM team was consulted for assistance with disease management and care coordination needs.    Engaged with patient by telephone for follow up visit in response to provider referral for pharmacy case management and/or care coordination services.   Consent to Services:  The patient was given information about Chronic Care Management services, agreed to services, and gave verbal consent prior to initiation of services.  Please see initial visit note for detailed documentation.   Patient Care Team: Linda Conners, Lucero as PCP - General (Family Medicine) Linda Lucero, [facility] [place] [facility] as Pharmacist (Pharmacist)  Recent office visits: 09/05/22 Linda Freshwater, Lucero: Patient presented for BP follow up. Prescribed cetirizine 10 mg daily. Continued BP medications and follow up in 6 months.   07/03/2022 Linda Freshwater Lucero - Patient was seen for primary hypertension and additional issues. Discontinued Miralax. Follow up in 2 months.    05/26/2022 Linda Lucero - Medicare annual wellness exam.  Recent consult visits: 01/13/2022 Linda Lucero (podiatry) - Patient was seen for Pain due to onychomycosis of toenails of both feet and additional issues. No medication changes. Follow up in 3 months.    Hospital visits: None in previous 6 months  Objective:  Lab Results  Component Value Date   CREATININE 1.32 (H) 09/05/2022    BUN 26 (H) 09/05/2022   GFR 35.17 (L) 09/05/2022   GFRNONAA 33 (L) 03/29/2021   GFRAA 40 (L) 05/07/2020   NA 139 09/05/2022   K 3.8 09/05/2022   CALCIUM 9.6 09/05/2022   CO2 26 09/05/2022    Lab Results  Component Value Date/Time   GFR 35.17 (L) 09/05/2022 01:38 PM   GFR 38.42 (L) 03/03/2022 02:29 PM    Last diabetic Eye exam: No results found for: "HMDIABEYEEXA"  Last diabetic Foot exam: No results found for: "HMDIABFOOTEX"   Lab Results  Component Value Date   CHOL 251 (H) 03/03/2022   HDL 96.90 03/03/2022   LDLCALC 135 (H) 03/03/2022   TRIG 100.0 03/03/2022   CHOLHDL 3 03/03/2022       Latest Ref Rng & Units 03/03/2022    2:29 PM 04/20/2021    3:06 PM 03/24/2021    3:50 PM  Hepatic Function  Total Protein 6.0 - 8.3 g/dL 7.8  7.6  7.5   Albumin 3.5 - 5.2 g/dL 4.2  4.1  3.6   AST 0 - 37 U/L _0 ALT 0 - 35 U/L _1 Alk Phosphatase 39 - 117 U/L 77  67  61   Total Bilirubin 0.2 - 1.2 mg/dL 0.3  0.4  0.7     Lab Results  Component Value Date/Time   TSH 4.71 07/03/2022 01:48 PM   TSH 6.78 (H) 03/03/2022 02:29 PM   FREET4 1.2 10/27/2020 04:13 PM   FREET4 0.71 12/25/2018 03:16 PM       Latest Ref Rng &  Units 03/03/2022    2:29 PM 04/20/2021    3:06 PM 03/28/2021   12:39 AM  CBC  WBC 4.0 - 10.5 K/uL 7.9  7.0  6.8   Hemoglobin 12.0 - 15.0 g/dL 9.8  10.1  8.4   Hematocrit 36.0 - 46.0 % 30.2  30.5  25.7   Platelets 150.0 - 400.0 K/uL 316.0  300.0  233     No results found for: "VD25OH"  Clinical ASCVD: No  The ASCVD Risk score (Arnett DK, et al., 2019) failed to calculate for the following reasons:   The 2019 ASCVD risk score is only valid for ages 54 to 62       05/26/2022    2:19 PM 05/25/2021    2:02 PM 05/25/2020    2:33 PM  Depression screen PHQ 2/9  Decreased Interest 0 0 0  Down, Depressed, Hopeless 0 0 0  PHQ - 2 Score 0 0 0      Social History   Tobacco Use  Smoking Status Former   Types: Cigarettes  Smokeless Tobacco Never    BP Readings from Last 3 Encounters:  09/05/22 (!) 138/92  07/03/22 (!) 156/76  03/03/22 (!) 186/60   Pulse Readings from Last 3 Encounters:  09/05/22 70  07/03/22 75  03/03/22 75   Wt Readings from Last 3 Encounters:  09/05/22 152 lb 6.4 oz (69.1 kg)  07/03/22 147 lb 14.4 oz (67.1 kg)  05/26/22 152 lb (68.9 kg)    Assessment/Interventions: Review of patient past medical history, allergies, medications, health status, including review of consultants reports, laboratory and other test data, was performed as part of comprehensive evaluation and provision of chronic care management services.   SDOH:  (Social Determinants of Health) assessments and interventions performed: Yes   Social Determinants of Health with Concerns   Tobacco Use: Medium Risk (09/05/2022)   Patient History    Smoking Tobacco Use: Former    Smokeless Tobacco Use: Never    Passive Exposure: Not on file  Food Insecurity: No Food Insecurity (05/26/2022)   Hunger Vital Sign    Worried About Running Out of Food in the Last Year: Never true    [place] in the Last Year: Never true  Recent Concern: [place] Present (03/29/2022)   Hunger Vital Sign    Worried About Running Out of Food in the Last Year: Sometimes true    Ran Out of Food in the Last Year: Sometimes true  Physical Activity: Insufficiently Active (05/26/2022)   Exercise Vital Sign    Days of Exercise per Week: 5 days    Minutes of Exercise per Session: 20 min  Utilities: Not on file     CCM Care Plan  Allergies  Allergen Reactions   Diovan [Valsartan]     Pt reports not allergic 03/25/21   Doxazosin     Pt reports not allergic 03/25/21    Medications Reviewed Today     Reviewed by Linda Conners, Lucero (Physician) on 09/05/22 at [place] List Status: <None>   Medication Order Taking? Sig Documenting Provider Last Dose Status Informant  Blood Pressure Monitoring (COMFORT TOUCH BP CUFF/LARGE) MISC [id number]   1 Device by Does not apply route daily as needed. Linda Conners, Lucero  Active   Cyanocobalamin (B-12 PO) [id number] Yes Take 1,000 mcg by mouth daily. Provider, Historical, Lucero Taking Active   feeding supplement (ENSURE ENLIVE / ENSURE PLUS) LIQD [id number] Yes Take 237 mLs  by mouth 2 (two) times daily between meals. Pokhrel, Corrie Mckusick, Lucero Taking Active   furosemide (LASIX) 40 MG tablet [id number]  Take 0.5 tablets (20 mg total) by mouth daily as needed for fluid or edema (3lbs weight gain in 24 hours). Flora Lipps, Lucero  Expired 03/29/22 2359   hydrALAZINE (APRESOLINE) 25 MG tablet [id number]  Take 1 tablet (25 mg total) by mouth 3 (three) times daily. Linda Conners, Lucero  Active   levothyroxine (SYNTHROID) 75 MCG tablet [id number] Yes Take 1 tablet (75 mcg total) by mouth daily. Caren Macadam, Lucero Taking Active   lisinopril (ZESTRIL) 20 MG tablet [id number] Yes Take 1 tablet (20 mg total) by mouth daily. Linda Conners, Lucero Taking Active   Wound Dressings [facility] BORDER FLEX) PADS [id number] Yes Apply 1 Pad topically daily. Linda Conners, Lucero Taking Active             Patient Active Problem List   Diagnosis Date Noted   B12 deficiency 07/03/2022   Malnutrition of moderate degree 03/28/2021   CHF (congestive heart failure) ([place]) 03/25/2021   Acute on chronic diastolic CHF (congestive heart failure) ([facility])    Elevated troponin    Acute CHF ([place]) 03/24/2021   Coronary artery disease    Low back pain 09/06/2018   Hypertension 09/06/2018   Chronic kidney disease, stage 3a ([place]) 09/06/2018   Hypothyroidism 09/06/2018    Immunization History  Administered Date(s) Administered   Fluad Quad(high Dose 65+) 10/27/2020, 09/02/2021, 09/05/2022   Influenza, High Dose Seasonal PF 09/04/2018, 08/01/2019   Influenza-Unspecified 08/01/2019   PFIZER(Purple Top)SARS-COV-2 Vaccination 01/11/2020, 02/10/2020, 08/17/2020, 02/26/2021   PNEUMOCOCCAL CONJUGATE-20 05/25/2021   Pneumococcal  Conjugate-13 10/24/2017   Tdap 10/24/2017   Zoster, Live 10/02/2016   Patient reports she is having pain almost daily within her joints but the pain doesn't hinder her with getting around or anything. Patient reports it's not painful just more stiff. She reports hot water helps in the shower in the morning.  Patient reports she is not using the BP cuff anymore and the nurse sent her the arm cuff and her son has been trying to calibrate it and she hasn't used it yet. Patient reports it's usually in the 160s but this was prior to getting the new cuff. Patient reports the wrist cuff was in the 200s and that's how she knew it was wrong. Her son is getting it calibrated and she is living with him now.  Patient feels a little wobbly when she hasn't slept enough and not coming from the medication.  Conditions to be addressed/monitored:  Hypertension, Chronic Kidney Disease, Hypothyroidism, Osteoarthritis and Insomnia  Conditions addressed this visit: Hypertension, CKD  Care Plan : CCM Pharmacy Care Plan  Updates made by Linda Lucero, [place] since 10/03/2022 12:00 AM     Problem: Problem: Hypertension, Chronic Kidney Disease, Hypothyroidism, Osteoarthritis and Insomnia      Long-Range Goal: Patient-Specific Goal   Start Date: 12/30/2020  Expected End Date: 12/30/2021  Recent Progress: On track  Priority: High  Note:   Current Barriers:  Unable to independently monitor therapeutic efficacy Unable to achieve control of blood pressure   Pharmacist Clinical Goal(s):  Patient will achieve control of hypertension as evidenced by blood pressure readings  through collaboration with PharmD and provider.   Interventions: 1:1 collaboration with Linda Conners, Lucero regarding development and update of comprehensive plan of care as evidenced by provider attestation and co-signature Inter-disciplinary care team collaboration (see longitudinal plan of  care) Comprehensive medication review performed;  medication list updated in electronic medical record  Hypertension (BP goal <140/90) -Uncontrolled -Current treatment: Hydralazine 25 mg three times daily (only takes 2 per day) - Appropriate, Effective, Safe, Accessible Lisinopril 20 mg 1 tablet once daily - Appropriate, Effective, Safe, Accessible -Medications previously tried: HCTZ (side effects), clonidine, losartan (unknown), valsartan -Current home readings:  162/84, 160/80s (wrist cuff - off in office from arm cuff) -Current dietary habits: doesn't eat a lot of salt when cooking and does not eat out; doesn't eat processed foods and drinks 5 glasses of water per day -Current exercise habits: does not Lucero down all day but no structured exercise; trying to walk around her yard more -Denies hypotensive/hypertensive symptoms -Educated on Importance of home blood pressure monitoring; Proper BP monitoring technique; Symptoms of hypotension and importance of maintaining adequate hydration; -Counseled to monitor BP at home daily document, and provide log at future appointments -Recommended to continue current medication Recommended purchasing Omron BP arm cuff to start checking BP at home again.  Insomnia (Goal: improve quality and quantity of sleep) -Controlled -Current treatment  No medications -Medications previously tried: trazodone -Recommended to continue current medication Counseled on practicing good sleep hygiene by setting a sleep schedule and maintaining it, avoid excessive napping, following a nightly routine, avoiding screen time for 30-60 minutes before going to bed, and making the bedroom a cool, quiet and dark space  Hypothyroidism (Goal: TSH 0.35-4.5) -Controlled -Current treatment  Levothyroxine 75 mcg 1 tablet daily - Appropriate, Effective, Safe, Accessible -Medications previously tried: none -Recommended to continue current medication   Osteoarthritis (Goal: minimize pain associated with  arthritis) -Controlled -Current treatment  Diclofenac 1% gel apply as needed - Appropriate, Effective, Safe, Accessible Tylenol as needed - Appropriate, Effective, Safe, Accessible -Medications previously tried: none  -Counseled on risk of bleeding with aspirin and kidney damage Recommended use of Tylenol more often than aspirin   CKD (Goal: prevent further worsening of kidneys) -Controlled -Current treatment  Lisinopril 20 mg 1 tablet daily - Appropriate, Effective, Safe, Accessible -Medications previously tried: losartan, valsartan  -Counseled on avoidance of NSAIDs for worsening kidney function  Allergic rhinitis (Goal: minimize symptoms) -Controlled -Current treatment  Cetirizine 10 mg 1 tablet daily - Appropriate, Effective, Safe, Accessible -Medications previously tried: none  -Recommended to continue current medication   Health Maintenance -Vaccine gaps: tetanus -Current therapy:  Zinc 50 mg daily  Vitamin D 1000 units daily  Multivitamin daily  -Educated on Cost vs benefit of each product must be carefully weighed by individual consumer -Patient is satisfied with current therapy and denies issues -Recommended to continue current medication  Patient Goals/Self-Care Activities Patient will:  - take medications as prescribed check blood pressure twice weekly, document, and provide at future appointments  Follow Up Plan: The care management team will reach out to the patient again over the next 30 days.         Medication Assistance: None required.  Patient affirms current coverage meets needs.  Compliance/Adherence/Medication fill history: Care Gaps: Shingrix, COVID booster Last BP - 138/92 on 09/05/2022  Star-Rating Drugs: Lisinopril 20 mg - [facility]    [facility]'s preferred [facility] is:  [id number] [place] [place] [place] [address]Phone: [phone]: [phone]  Uses pill box? No - different timing of medications Pt endorses 99% compliance  We discussed: Benefits of medication synchronization,  packaging and delivery as well as enhanced pharmacist oversight with [employer]. Patient decided to: Continue current medication management strategy  Care Plan and Follow Up Patient Decision:  Patient agrees to Care Plan and Follow-up.  Plan: The care management team will reach out to the patient again over the next 30 days.   Jeni Salles, PharmD [facility] Clinical Pharmacist [facility] [place]

## 2022-10-04 ENCOUNTER — Telehealth: Payer: Medicare HMO

## 2022-10-05 DIAGNOSIS — N1831 Chronic kidney disease, stage 3a: Secondary | ICD-10-CM

## 2022-10-05 DIAGNOSIS — I10 Essential (primary) hypertension: Secondary | ICD-10-CM

## 2022-10-24 ENCOUNTER — Telehealth: Payer: Self-pay | Admitting: Pharmacist

## 2022-10-24 NOTE — Chronic Care Management (AMB) (Signed)
    Chronic Care Management Pharmacy Assistant   Name: Linda Lucero  MRN: [id number] DOB: Jul 31, 1928  Reason for Encounter: Hypertension Follow Up   Reviewed chart prior to disease state call. Spoke with patient regarding BP  Recent Office Vitals: BP Readings from Last 3 Encounters:  09/05/22 (!) 138/92  07/03/22 (!) 156/76  03/03/22 (!) 186/60   Pulse Readings from Last 3 Encounters:  09/05/22 70  07/03/22 75  03/03/22 75    Wt Readings from Last 3 Encounters:  09/05/22 152 lb 6.4 oz (69.1 kg)  07/03/22 147 lb 14.4 oz (67.1 kg)  05/26/22 152 lb (68.9 kg)     Kidney Function Lab Results  Component Value Date/Time   CREATININE 1.32 (H) 09/05/2022 01:38 PM   CREATININE 1.23 (H) 03/03/2022 02:29 PM   CREATININE 1.49 (H) 08/20/2020 02:26 PM   CREATININE 1.98 (H) 08/11/2020 03:19 PM   GFR 35.17 (L) 09/05/2022 01:38 PM   GFRNONAA 33 (L) 03/29/2021 01:44 AM   GFRAA 40 (L) 05/07/2020 07:58 PM       Latest Ref Rng & Units 09/05/2022    1:38 PM 03/03/2022    2:29 PM 04/20/2021    3:06 PM  BMP  Glucose 70 - 99 mg/dL 564  87  332   BUN 6 - 23 mg/dL 26  23  27    Creatinine 0.40 - 1.20 mg/dL  9.51  8.84   Sodium 135 - 145 mEq/L 139  139  135   Potassium 3.5 - 5.1 mEq/L 3.8  3.8  4.3   Chloride 96 - 112 mEq/L 106  105  102   CO2 19 - 32 mEq/L 26  25  23    Calcium 8.4 - 10.5 mg/dL 9.6  9.6  9.8     Current antihypertensive regimen:  Hydralazine 25 mg three times daily Lisinopril 20 mg daily  How often are you checking your Blood Pressure?   Current home BP readings:   What recent interventions/DTPs have been made by any provider to improve Blood Pressure control since last CPP Visit: Recommended to check blood pressures every other day.   Unable to reach patient after several attempts, will attempt to reach patient again in a couple weeks.   Care Gaps: AWV - scheduled 05/31/2023 Last BP - 138/92 on 09/05/2022 Shingrix - never done Covid - overdue  Star  Rating Drugs: Lisinopril 20 mg - last [facility]   09/07/2022 [facility]  Clinical Pharmacist Assistant [phone]

## 2022-11-06 ENCOUNTER — Other Ambulatory Visit: Payer: Self-pay | Admitting: Family Medicine

## 2022-11-06 DIAGNOSIS — I10 Essential (primary) hypertension: Secondary | ICD-10-CM

## 2022-11-16 ENCOUNTER — Encounter: Payer: Self-pay | Admitting: Licensed Clinical Social Worker

## 2022-11-16 ENCOUNTER — Telehealth: Payer: Self-pay | Admitting: Licensed Clinical Social Worker

## 2022-11-16 NOTE — Patient Outreach (Signed)
  Care Coordination   11/16/2022 Name: Linda Lucero MRN: [id number] DOB: 04-08-1928   Care Coordination Outreach Attempts:  An unsuccessful telephone outreach was attempted for a scheduled appointment today.  Follow Up Plan:  Additional outreach attempts will be made to offer the patient care coordination information and services.   Encounter Outcome:  No Answer   Care Coordination Interventions:  No, not indicated    Christa See, MSW, [email or web address] Phone [phone] 10:15 AM

## 2022-12-14 ENCOUNTER — Telehealth: Payer: Self-pay

## 2022-12-14 NOTE — Progress Notes (Signed)
Care Management & Coordination Services Pharmacy Team  Reason for Encounter: Hypertension  Contacted patient to discuss hypertension disease state. Spoke with patient on 12/14/2022     Current antihypertensive regimen:  Hydralazine 25 mg three times daily Lisinopril 20 mg daily Patient verbally confirms she is taking the above medications as directed. Yes  How often are you checking your Blood Pressure? Patient states she is checking blood pressures twice a week, she is not logging them.  She states she will try and do better at this.   she checks her blood pressure at various times.  Current home BP readings: Patient states her readings are between 140/80 - 150/85 DATE:             BP               PULSE 12/10/22 150/84   -  Wrist or arm cuff:Arm  OTC medications including pseudoephedrine or NSAIDs? Patient denies  Any readings above 180/100? Patient denies  What recent interventions/DTPs have been made by any provider to improve Blood Pressure control since last CPP Visit: No recent interventions.  Any recent hospitalizations or ED visits since last visit with CPP? No recent hospital visits.  What diet changes have been made to improve Blood Pressure Control?  Patient follows no specific diet Breakfast - patient will have a hot cereal, something easy to chew Lunch - patient will have meals on wheels Mon, Wed and Fri, her son will pick up a meal on other days Dinner - patient will have a soft meal containing a meat and vegetable.  Caffeine intake: patient denies any caffeine beverages Salt intake: patient does not add salt  What exercise is being done to improve your Blood Pressure Control?  Patients son will take her shopping twice weekly and she will walk in the stores. She walks up and down the stairs 3-4 times daily and will walk around the yard when the weather is nice.    Adherence Review: Is the patient currently on ACE/ARB medication? Yes Does the patient have >5  day gap between last estimated fill dates? No  Star Rating Drugs:  Lisinopril 20 mg - [facility]  Chart Updates: Recent office visits:  None  Recent consult visits:  None  Hospital visits:  None  Medications: Outpatient Encounter Medications as of 12/14/2022  Medication Sig   Blood Pressure Monitoring (COMFORT TOUCH BP CUFF/LARGE) MISC 1 Device by Does not apply route daily as needed.   cetirizine (ZYRTEC) 10 MG tablet Take 1 tablet (10 mg total) by mouth daily.   Cyanocobalamin (B-12 PO) Take 1,000 mcg by mouth daily.   feeding supplement (ENSURE ENLIVE / ENSURE PLUS) LIQD Take 237 mLs by mouth 2 (two) times daily between meals.   furosemide (LASIX) 40 MG tablet Take 0.5 tablets (20 mg total) by mouth daily as needed for fluid or edema (3lbs weight gain in 24 hours).   hydrALAZINE (APRESOLINE) 25 MG tablet Take 1 tablet (25 mg total) by mouth 3 (three) times daily.   levothyroxine (SYNTHROID) 75 MCG tablet Take 1 tablet (75 mcg total) by mouth daily.   lisinopril (ZESTRIL) 20 MG tablet TAKE 1 TABLET BY MOUTH EVERY DAY   Wound Dressings (MEPILEX BORDER FLEX) PADS Apply 1 Pad topically daily.   No facility-administered encounter medications on file as of 12/14/2022.  Fill History:   Dispensed Days Supply Quantity Provider Pharmacy  FUROSEMIDE 40MG  TAB 06/23/2021 90 45 tablet  Dispensed Days Supply Quantity Provider Pharmacy  HYDRALAZINE 25 MG TABLET 11/06/2022 90 270 each      Dispensed Days Supply Quantity Provider Pharmacy  levothyroxine 75 mcg tablet 12/09/2022 90 90 tablet      Dispensed Days Supply Quantity Provider Pharmacy  LISINOPRIL 20 MG TABLET 10/13/2022 90 90 each     Recent Office Vitals: BP Readings from Last 3 Encounters:  09/05/22 (!) 138/92  07/03/22 (!) 156/76  03/03/22 (!) 186/60   Pulse Readings from Last 3 Encounters:  09/05/22 70  07/03/22 75  03/03/22 75    Wt Readings from Last 3 Encounters:  09/05/22 152 lb 6.4 oz  (69.1 kg)  07/03/22 147 lb 14.4 oz (67.1 kg)  05/26/22 152 lb (68.9 kg)     Kidney Function Lab Results  Component Value Date/Time   CREATININE 1.32 (H) 09/05/2022 01:38 PM   CREATININE 1.23 (H) 03/03/2022 02:29 PM   CREATININE 1.49 (H) 08/20/2020 02:26 PM   CREATININE 1.98 (H) 08/11/2020 03:19 PM   GFR 35.17 (L) 09/05/2022 01:38 PM   GFRNONAA 33 (L) 03/29/2021 01:44 AM   GFRAA 40 (L) 05/07/2020 07:58 PM       Latest Ref Rng & Units 09/05/2022    1:38 PM 03/03/2022    2:29 PM 04/20/2021    3:06 PM  BMP  Glucose 70 - 99 mg/dL 106  87  115   BUN 6 - 23 mg/dL 26  23  27    Creatinine 0.40 - 1.20 mg/dL 1.32  1.23  1.56   Sodium 135 - 145 mEq/L 139  139  135   Potassium 3.5 - 5.1 mEq/L 3.8  3.8  4.3   Chloride 96 - 112 mEq/L 106  105  102   CO2 19 - 32 mEq/L 26  25  23    Calcium 8.4 - 10.5 mg/dL 9.6  9.6  9.8    [place] Pharmacist Assistant [phone]

## 2023-01-29 ENCOUNTER — Telehealth: Payer: Self-pay | Admitting: Licensed Clinical Social Worker

## 2023-01-30 NOTE — Patient Outreach (Signed)
  Care Coordination   Follow Up Visit Note   01/29/23 Name: Linda Lucero MRN: [id number] DOB: 1928-09-11  Linda Lucero is a 89 y.o. year old female who sees Farrel Conners, MD for primary care. I spoke with  Rogue Jury Hosack's son by phone today.  What matters to the patients health and wellness today?  SDOH assessment and healthy coping skills    Goals Addressed             This Visit's Progress    Find Help in My Community   On track    Activities and task to complete in order to accomplish goals.   Keep all upcoming appointments discussed today Continue with compliance of taking medication prescribed by Doctor Implement healthy coping skills discussed to assist with management of symptoms Continue working with [facility] care team to assist with goals identified           SDOH assessments and interventions completed:  Yes  SDOH Interventions Today    Flowsheet Row Most Recent Value  SDOH Interventions   Food Insecurity Interventions Intervention Not Indicated  [Pt continues to receive MOWs]  Housing Interventions Intervention Not Indicated  Transportation Interventions Intervention Not Indicated        Care Coordination Interventions:  Yes, provided  Interventions Today    Flowsheet Row Most Recent Value  Chronic Disease   Chronic disease during today's visit Hypertension (HTN), Congestive Heart Failure (CHF), Chronic Kidney Disease/End Stage Renal Disease (ESRD)  General Interventions   General Interventions Discussed/Reviewed General Interventions Reviewed, Community Resources  [LCSW assessed SDOH and patient care needs. Reviewed upcoming appts]  Exercise Interventions   Exercise Discussed/Reviewed Physical Activity  Physical Activity Discussed/Reviewed Physical Activity Reviewed  [Patient continues to engage in physical activity]  Mental Health Interventions   Mental Health Discussed/Reviewed Coping Strategies  [Pt continues to receive strong support  from son. Continues to implement healthy coping skills]  Nutrition Interventions   Nutrition Discussed/Reviewed Nutrition Reviewed  [Patient continues to receive MOW for heart healthy diet]  Pharmacy Interventions   Pharmacy Dicussed/Reviewed Medication Adherence  [Family reports med compliance]       Follow up plan: Follow up call scheduled for 4-6 weeks    Encounter Outcome:  Pt. Visit Completed   Christa See, MSW, [email or web address] Phone [phone] 4:47 PM

## 2023-01-30 NOTE — Patient Instructions (Signed)
Visit Information  Thank you for taking time to visit with me today. Please don't hesitate to contact me if I can be of assistance to you.   Following are the goals we discussed today:   Goals Addressed             This Visit's Progress    Find Help in My Community   On track    Activities and task to complete in order to accomplish goals.   Keep all upcoming appointments discussed today Continue with compliance of taking medication prescribed by Doctor Implement healthy coping skills discussed to assist with management of symptoms Continue working with [facility] care team to assist with goals identified           Our next appointment is by telephone on 05/06 at 11 AM  Please call the care guide team at [phone] if you need to cancel or reschedule your appointment.   If you are experiencing a Mental Health or [place] or need someone to talk to, please call the Suicide and Crisis Lifeline: 988 call 911   Patient verbalizes understanding of instructions and care plan provided today and agrees to view in [place]. Active MyChart status and patient understanding of how to access instructions and care plan via MyChart confirmed with patient.     Christa See, MSW, [email or web address] Phone [phone] 4:49 PM

## 2023-03-05 ENCOUNTER — Ambulatory Visit: Payer: Medicare HMO | Admitting: Family Medicine

## 2023-03-12 ENCOUNTER — Ambulatory Visit: Payer: Self-pay | Admitting: Licensed Clinical Social Worker

## 2023-03-13 NOTE — Patient Outreach (Signed)
  Care Coordination   Follow Up Visit Note   03/12/2023 Name: Linda Lucero MRN: [id number] DOB: 08/28/1928  Linda Lucero is a 89 y.o. year old female who sees Karie Georges, MD for primary care. I spoke with  Linda Lucero's son, Linda Lucero, by phone today.  What matters to the patients health and wellness today?  Dental Resources    Goals Addressed             This Visit's Progress    Find Help in My Community   On track    Activities and task to complete in order to accomplish goals.   Keep all upcoming appointments discussed today Continue with compliance of taking medication prescribed by Doctor Implement healthy coping skills discussed to assist with management of symptoms Continue working with [facility] care team to assist with goals identified           SDOH assessments and interventions completed:  No     Care Coordination Interventions:  Yes, provided  Interventions Today    Flowsheet Row Most Recent Value  Chronic Disease   Chronic disease during today's visit Hypertension (HTN), Congestive Heart Failure (CHF), Chronic Kidney Disease/End Stage Renal Disease (ESRD)  General Interventions   General Interventions Discussed/Reviewed General Interventions Reviewed, [employer] is doing well. Requests in-network dental resources. LCSW reviewed upcoming appts]  Mental Health Interventions   Mental Health Discussed/Reviewed Mental Health Reviewed  [Pt's son denies any stressors or symptoms of depression/anxiety for pt]       Follow up plan: Follow up call scheduled for 2-4 weeks    Encounter Outcome:  Pt. Visit Completed   Linda Lucero, MSW, LCSW [facility] Care Management [facility]  [facility] [email or web address] Phone [phone] 5:37 PM

## 2023-03-13 NOTE — Patient Instructions (Signed)
Visit Information  Thank you for taking time to visit with me today. Please don't hesitate to contact me if I can be of assistance to you.   Following are the goals we discussed today:   Goals Addressed             This Visit's Progress    Find Help in My Community   On track    Activities and task to complete in order to accomplish goals.   Keep all upcoming appointments discussed today Continue with compliance of taking medication prescribed by Doctor Implement healthy coping skills discussed to assist with management of symptoms Continue working with [facility] care team to assist with goals identified           Our next appointment is by telephone on 06/04 at 3 PM  Please call the care guide team at [phone] if you need to cancel or reschedule your appointment.   If you are experiencing a Mental Health or Behavioral Health Crisis or need someone to talk to, please call the Suicide and Crisis Lifeline: 988 call 911   Patient verbalizes understanding of instructions and care plan provided today and agrees to view in MyChart. Active MyChart status and patient understanding of how to access instructions and care plan via MyChart confirmed with patient.     Jenel Lucks, MSW, LCSW [facility] Care Management [place]  [facility] [email or web address] Phone [phone] 5:40 PM

## 2023-03-23 ENCOUNTER — Ambulatory Visit: Payer: Medicare HMO | Admitting: Family Medicine

## 2023-04-09 ENCOUNTER — Ambulatory Visit (INDEPENDENT_AMBULATORY_CARE_PROVIDER_SITE_OTHER): Payer: Medicare HMO | Admitting: Family Medicine

## 2023-04-09 ENCOUNTER — Encounter: Payer: Self-pay | Admitting: Family Medicine

## 2023-04-09 VITALS — BP 164/90 | HR 70 | Temp 98.4°F | Ht 62.0 in | Wt 144.1 lb

## 2023-04-09 DIAGNOSIS — E538 Deficiency of other specified B group vitamins: Secondary | ICD-10-CM | POA: Diagnosis not present

## 2023-04-09 DIAGNOSIS — I10 Essential (primary) hypertension: Secondary | ICD-10-CM | POA: Diagnosis not present

## 2023-04-09 DIAGNOSIS — E890 Postprocedural hypothyroidism: Secondary | ICD-10-CM | POA: Diagnosis not present

## 2023-04-09 DIAGNOSIS — N1831 Chronic kidney disease, stage 3a: Secondary | ICD-10-CM

## 2023-04-09 DIAGNOSIS — D649 Anemia, unspecified: Secondary | ICD-10-CM | POA: Diagnosis not present

## 2023-04-09 DIAGNOSIS — R630 Anorexia: Secondary | ICD-10-CM

## 2023-04-09 LAB — CBC
HCT: 32.8 % — ABNORMAL LOW (ref 36.0–46.0)
Hemoglobin: 10.7 g/dL — ABNORMAL LOW (ref 12.0–15.0)
MCHC: 32.6 g/dL (ref 30.0–36.0)
MCV: 64.2 fl — ABNORMAL LOW (ref 78.0–100.0)
Platelets: 342 10*3/uL (ref 150.0–400.0)
RBC: 5.12 Mil/uL — ABNORMAL HIGH (ref 3.87–5.11)
RDW: 21.6 % — ABNORMAL HIGH (ref 11.5–15.5)
WBC: 7.6 10*3/uL (ref 4.0–10.5)

## 2023-04-09 LAB — COMPREHENSIVE METABOLIC PANEL
ALT: 12 U/L (ref 0–35)
AST: 24 U/L (ref 0–37)
Albumin: 4.1 g/dL (ref 3.5–5.2)
Alkaline Phosphatase: 99 U/L (ref 39–117)
BUN: 27 mg/dL — ABNORMAL HIGH (ref 6–23)
CO2: 22 mEq/L (ref 19–32)
Calcium: 9.6 mg/dL (ref 8.4–10.5)
Chloride: 104 mEq/L (ref 96–112)
Creatinine, Ser: 1.65 mg/dL — ABNORMAL HIGH (ref 0.40–1.20)
GFR: 26.8 mL/min — ABNORMAL LOW (ref >60.00)
Glucose, Bld: 89 mg/dL (ref 70–99)
Potassium: 4.1 mEq/L (ref 3.5–5.1)
Sodium: 137 mEq/L (ref 135–145)
Total Bilirubin: 0.3 mg/dL (ref 0.2–1.2)
Total Protein: 7.9 g/dL (ref 6.0–8.3)

## 2023-04-09 LAB — VITAMIN D 25 HYDROXY (VIT D DEFICIENCY, FRACTURES): VITD: 31 ng/mL (ref 30.00–100.00)

## 2023-04-09 LAB — B12 AND FOLATE PANEL
Folate: >23.9 ng/mL (ref >5.9)
Vitamin B-12: 335 pg/mL (ref 211–911)

## 2023-04-09 LAB — TSH: TSH: 6.65 u[IU]/mL — ABNORMAL HIGH (ref 0.35–5.50)

## 2023-04-09 MED ORDER — LEVOTHYROXINE SODIUM 75 MCG PO TABS
75.0000 ug | ORAL_TABLET | Freq: Every day | ORAL | 1 refills | Status: DC
Start: 2023-04-09 — End: 2023-04-10

## 2023-04-09 MED ORDER — LISINOPRIL 40 MG PO TABS: 40.0000 mg | ORAL_TABLET | Freq: Every day | ORAL | 1 refills | Status: DC

## 2023-04-09 NOTE — Progress Notes (Signed)
Established Patient Office Visit  Subjective   Patient ID: Linda Lucero, female    DOB: 1928/03/25  Age: 89 y.o. MRN: [id number]  Chief Complaint  Patient presents with   Medical Management of Chronic Issues   Fatigue    X2 weeks    Patient is here for 6 month follow up.  Pt reports that lately she has been feeling weak, wobbly with loss of appetite. No other associated symptoms, no chest pain, no headaches or dizziness, no vertigo, no nausea, no abdominal pain, no fever/chills, has lost about 8 pounds since her last visit. States that she has to really make herself eat. This has been going on for about the last 3 weeks or so. No passing out, no stroke like symptoms.   HTN-- pt's BP cuff is not working properly. States that she is compliant with her medications however her BP remains elevated. She is not having any symptoms from the high blood pressure, see above. No blurry vision, no swelling in her ankles. Pt ambulates with a walker, can't really do housework anymore. Gets meal on wheels delivered and her son bought her the boost shakes. She is due for bloodwork today to follow up on her anemia, thyroid, and kidney function. Pt reports she is making plenty of urine, no constipation or diarrhea.    Current Outpatient Medications  Medication Instructions   Blood Pressure Monitoring (COMFORT TOUCH BP CUFF/LARGE) MISC 1 Device, Does not apply, Daily PRN   cetirizine (ZYRTEC) 10 mg, Oral, Daily   Cyanocobalamin (B-12 PO) 1,000 mcg, Oral, Daily   hydrALAZINE (APRESOLINE) 25 mg, Oral, 3 times daily   levothyroxine (SYNTHROID) 75 mcg, Oral, Daily   lisinopril (ZESTRIL) 40 mg, Oral, Daily   Multiple Vitamin (MULTIVITAMIN PO) Oral, Daily   Wound Dressings (MEPILEX BORDER FLEX) PADS 1 Pad, Apply externally, Daily    Patient Active Problem List   Diagnosis Date Noted   B12 deficiency 07/03/2022   Malnutrition of moderate degree 03/28/2021   CHF (congestive heart failure) (HCC)  03/25/2021   Acute on chronic diastolic CHF (congestive heart failure) (HCC)    Elevated troponin    Acute CHF (HCC) 03/24/2021   Coronary artery disease    Low back pain 09/06/2018   Hypertension 09/06/2018   Chronic kidney disease, stage 3a (HCC) 09/06/2018   Hypothyroidism 09/06/2018      Review of Systems  All other systems reviewed and are negative.     Objective:     BP (!) 164/90 (BP Location: Right Arm, Patient Position: Sitting, Cuff Size: Normal)   Pulse 70   Temp 98.4 F (36.9 C) (Oral)   Ht 5\' 2"  (1.575 m)   Wt 144 lb 1.6 oz (65.4 kg)   LMP  (LMP Unknown)   SpO2 98%   BMI 26.36 kg/m    Physical Exam Vitals reviewed.  Constitutional:      Appearance: Normal appearance. She is well-groomed and normal weight.  Eyes:     Conjunctiva/sclera: Conjunctivae normal.  Neck:     Thyroid: No thyromegaly.  Cardiovascular:     Rate and Rhythm: Normal rate and regular rhythm.     Pulses: Normal pulses.     Heart sounds: S1 normal and S2 normal.  Pulmonary:     Effort: Pulmonary effort is normal.     Breath sounds: Normal breath sounds and air entry.  Abdominal:     General: Bowel sounds are normal.  Musculoskeletal:     Right lower leg: No  edema.     Left lower leg: No edema.  Neurological:     Mental Status: She is alert and oriented to person, place, and time. Mental status is at baseline.     Gait: Gait is intact.  Psychiatric:        Mood and Affect: Mood and affect normal.        Speech: Speech normal.        Behavior: Behavior normal.        Judgment: Judgment normal.      No results found for any visits on 04/09/23.    The ASCVD Risk score (Arnett DK, et al., 2019) failed to calculate for the following reasons:   The 2019 ASCVD risk score is only valid for ages 19 to 52    Assessment & Plan:  Primary hypertension Assessment & Plan: BP remains elevated, we will increase her lisinopril to 40 mg daily, this was written out for the patient,  will continue the hydralazine 25 mg TID. RTC in 2 months.   Orders: -     Lisinopril; Take 1 tablet (40 mg total) by mouth daily.  Dispense: 90 tablet; Refill: 1  Postablative hypothyroidism Assessment & Plan: Will recheck TSH today, adjust levothyroxine dose if needed, for now will continue current dose.  Orders: -     Levothyroxine Sodium; Take 1 tablet (75 mcg total) by mouth daily.  Dispense: 90 tablet; Refill: 1 -     TSH  B12 deficiency Assessment & Plan: Nees new level checked  Orders: -     B12 and Folate Panel  Chronic kidney disease, stage 3a (HCC) Assessment & Plan: With associated anemia of chronic disease, will check iron levels, CBC and CMP today in follow up since she has had a change in status (decreased appetite).  Orders: -     Comprehensive metabolic panel  Anemia, unspecified type -     Iron, TIBC and Ferritin Panel -     CBC  Decreased appetite -     VITAMIN D 25 Hydroxy (Vit-D Deficiency, Fractures)   Unclear etiology, this is a new symptom, will check vitamin D, we discussed adding mirtazepine at bedtime to help increase appetite, she also reports feeling down/ depressed recently too...  Return in about 2 months (around 06/09/2023) for HTN.    Karie Georges, MD

## 2023-04-09 NOTE — Assessment & Plan Note (Signed)
Nees new level checked

## 2023-04-09 NOTE — Patient Instructions (Addendum)
Fairlife brand shakes  Premier protein shakes   Muscle milk shakes  INCREASE lisinopril to 40 mg daily-- you make take 2 tablets of the 20 mg pills until your next refill

## 2023-04-09 NOTE — Assessment & Plan Note (Signed)
With associated anemia of chronic disease, will check iron levels, CBC and CMP today in follow up since she has had a change in status (decreased appetite).

## 2023-04-09 NOTE — Assessment & Plan Note (Signed)
BP remains elevated, we will increase her lisinopril to 40 mg daily, this was written out for the patient, will continue the hydralazine 25 mg TID. RTC in 2 months.

## 2023-04-09 NOTE — Assessment & Plan Note (Signed)
Will recheck TSH today, adjust levothyroxine dose if needed, for now will continue current dose.

## 2023-04-10 ENCOUNTER — Ambulatory Visit: Payer: Self-pay | Admitting: Licensed Clinical Social Worker

## 2023-04-10 LAB — IRON,TIBC AND FERRITIN PANEL
%SAT: 26 % (calc) (ref 16–45)
Ferritin: 15 ng/mL — ABNORMAL LOW (ref 16–288)
Iron: 88 ug/dL (ref 45–160)
TIBC: 335 mcg/dL (calc) (ref 250–450)

## 2023-04-10 MED ORDER — LEVOTHYROXINE SODIUM 88 MCG PO TABS
88.0000 ug | ORAL_TABLET | Freq: Every day | ORAL | 1 refills | Status: DC
Start: 2023-04-10 — End: 2023-10-11

## 2023-04-10 MED ORDER — IRON (FERROUS SULFATE) 325 (65 FE) MG PO TABS
325.0000 mg | ORAL_TABLET | Freq: Every day | ORAL | 5 refills | Status: DC
Start: 2023-04-10 — End: 2023-07-17

## 2023-04-10 NOTE — Addendum Note (Signed)
Addended by: Karie Georges on: 04/10/2023 08:40 AM   Modules accepted: Orders

## 2023-04-11 NOTE — Patient Instructions (Signed)
Visit Information  Thank you for taking time to visit with me today. Please don't hesitate to contact me if I can be of assistance to you.   Following are the goals we discussed today:   Goals Addressed             This Visit's Progress    Find Help in My Community   On track    Activities and task to complete in order to accomplish goals.   Keep all upcoming appointments discussed today Continue with compliance of taking medication prescribed by Doctor Implement healthy coping skills discussed to assist with management of symptoms Continue working with [facility] care team to assist with goals identified           Please call the care guide team at [phone] if you need to cancel or reschedule your appointment.   If you are experiencing a Mental Health or Behavioral Health Crisis or need someone to talk to, please call the Suicide and Crisis Lifeline: 988 call 911   Patient verbalizes understanding of instructions and care plan provided today and agrees to view in MyChart. Active MyChart status and patient understanding of how to access instructions and care plan via MyChart confirmed with patient.     Jenel Lucks, MSW, LCSW [facility] Care Management [place]  [facility] [email or web address] Phone [phone] 6:30 PM

## 2023-04-11 NOTE — Patient Outreach (Signed)
  Care Coordination   Follow Up Visit Note   6/542024 Name: Linda Lucero MRN: [id number] DOB: 10-Mar-1928  Linda Lucero is a 89 y.o. year old female who sees Karie Georges, MD for primary care. I spoke with  Linda Lucero by phone today.  What matters to the patients health and wellness today?  BP Monitoring    Goals Addressed             This Visit's Progress    Find Help in My Community   On track    Activities and task to complete in order to accomplish goals.   Keep all upcoming appointments discussed today Continue with compliance of taking medication prescribed by Doctor Implement healthy coping skills discussed to assist with management of symptoms Continue working with [facility] care team to assist with goals identified           SDOH assessments and interventions completed:  No     Care Coordination Interventions:  Yes, provided  Interventions Today    Flowsheet Row Most Recent Value  Chronic Disease   Chronic disease during today's visit Congestive Heart Failure (CHF), Hypertension (HTN), Chronic Kidney Disease/End Stage Renal Disease (ESRD)  General Interventions   General Interventions Discussed/Reviewed General Interventions Reviewed, [facility], Doctor Visits  [Pt reports difficulty obtaining correct blood pressure with current machine. Requesting a new BP monitor. Son continues to assist with transportation]  Doctor Visits Discussed/Reviewed Doctor Visits Reviewed  Mental Health Interventions   Mental Health Discussed/Reviewed Mental Health Reviewed, Coping Strategies  Nutrition Interventions   Nutrition Discussed/Reviewed Nutrition Reviewed  [Pt continues to receive MOW 3 x weekly]  Pharmacy Interventions   Pharmacy Dicussed/Reviewed Pharmacy Topics Reviewed  Safety Interventions   Safety Discussed/Reviewed Safety Reviewed       Follow up plan: Follow up call scheduled for 2-4 weeks    Encounter Outcome:  Pt. Visit Completed    Jenel Lucks, MSW, LCSW [facility] Care Management [facility] Health  [facility] [email or web address] Phone [phone] 6:29 PM

## 2023-04-25 NOTE — Progress Notes (Signed)
Ok she can wait to pick it up it shouldn't be a problem

## 2023-05-14 ENCOUNTER — Other Ambulatory Visit: Payer: Self-pay | Admitting: Family Medicine

## 2023-05-14 ENCOUNTER — Other Ambulatory Visit: Payer: Medicare HMO

## 2023-05-14 DIAGNOSIS — I10 Essential (primary) hypertension: Secondary | ICD-10-CM

## 2023-05-14 NOTE — Progress Notes (Signed)
   05/14/2023  Patient ID: Linda Lucero, female   DOB: 28-Jun-1928, 89 y.o.   MRN: [id number]  Outreach attempt for scheduled telephone visit unsuccessful.  Called x3 and left voicemail(s) with my direct number for patient to call to reschedule at her convenience.  I will also ask scheduler for my team to attempt to reschedule in case Ms. Asano does not return my call.  Lenna Gilford, PharmD, DPLA

## 2023-06-01 ENCOUNTER — Telehealth: Payer: Self-pay | Admitting: Licensed Clinical Social Worker

## 2023-06-01 NOTE — Patient Outreach (Signed)
  Care Coordination   Follow Up Visit Note   05/31/2023 Name: MYRIKAL NEUHART MRN: [id number] DOB: [birth year]  [name] is a 89 y.o. year old female who sees Karie Georges, MD for primary care. I spoke with  Kelli Churn Odonovan's son by phone today.  What matters to the patients health and wellness today?  Dental    Goals Addressed             This Visit's Progress    Find Help in My Community   On track    Activities and task to complete in order to accomplish goals.   Keep all upcoming appointments discussed today Continue with compliance of taking medication prescribed by Doctor Implement healthy coping skills discussed to assist with management of symptoms Continue working with [facility] care team to assist with goals identified           SDOH assessments and interventions completed:  No     Care Coordination Interventions:  Yes, provided  Interventions Today    Flowsheet Row Most Recent Value  Chronic Disease   Chronic disease during today's visit Hypertension (HTN), Congestive Heart Failure (CHF), Chronic Kidney Disease/End Stage Renal Disease (ESRD)  General Interventions   General Interventions Discussed/Reviewed General Interventions Reviewed, Doctor Visits, Community Resources  Doctor Visits Discussed/Reviewed Doctor Visits Reviewed  Mental Health Interventions   Mental Health Discussed/Reviewed Mental Health Reviewed, Coping Strategies  Nutrition Interventions   Nutrition Discussed/Reviewed Nutrition Reviewed  Pharmacy Interventions   Pharmacy Dicussed/Reviewed Pharmacy Topics Reviewed  Safety Interventions   Safety Discussed/Reviewed Safety Reviewed       Follow up plan: Follow up call scheduled for 2-4 weeks    Encounter Outcome:  Pt. Visit Completed   Jenel Lucks, MSW, LCSW [facility] Care Management [facility]  [facility] [email or web address] Phone [phone] 6:55 AM

## 2023-06-01 NOTE — Patient Instructions (Signed)
Visit Information  Thank you for taking time to visit with me today. Please don't hesitate to contact me if I can be of assistance to you.   Following are the goals we discussed today:   Goals Addressed             This Visit's Progress    Find Help in My Community   On track    Activities and task to complete in order to accomplish goals.   Keep all upcoming appointments discussed today Continue with compliance of taking medication prescribed by Doctor Implement healthy coping skills discussed to assist with management of symptoms Continue working with [facility] care team to assist with goals identified           Please call the care guide team at [phone] if you need to cancel or reschedule your appointment.   If you are experiencing a Mental Health or Behavioral Health Crisis or need someone to talk to, please call the Suicide and Crisis Lifeline: 988 call 911   Patient verbalizes understanding of instructions and care plan provided today and agrees to view in MyChart. Active MyChart status and patient understanding of how to access instructions and care plan via MyChart confirmed with patient.     Jenel Lucks, MSW, LCSW [facility] Care Management [place]  [facility] [email or web address] Phone [phone] 6:55 AM

## 2023-06-07 ENCOUNTER — Other Ambulatory Visit: Payer: Medicare HMO

## 2023-06-07 NOTE — Progress Notes (Signed)
   06/07/2023 Name: Linda Lucero MRN: [id number] DOB: [birth year]  Chief Complaint  Patient presents with   Medication Management   Linda Lucero is a 89 y.o. year old female who presented for a telephone visit to establish care with [facility] PharmD  Subjective:  Care Team: Primary Care Provider: Karie Georges, MD ; Next Scheduled Visit: 9/10  Medication Access/Adherence  Current Pharmacy:  [id number] - [place], [place] - [address] AT [facility] [address] [place] [address] Phone: [phone] Fax: [phone]  Patient reports affordability concerns with their medications: No  Patient reports access/transportation concerns to their pharmacy: No  Patient reports adherence concerns with their medications:  Yes- patient is only taking Iron twice weekly due to constipation.  She also states she sometimes forgets third dose of hydralazine some days.  Hypertension: Current medications: lisinopril 40mg  daily, hydralazine 25mg  TID -Patient has a validated, automated, upper arm home BP cuff; but it is not working properly -Current blood pressure readings readings: 164/90 at last OV -Patient denies hypotensive s/sx including dizziness, lightheadedness.  -Patient denies hypertensive symptoms including headache, chest pain, shortness of breath  Objective: Lab Results  Component Value Date   CREATININE 1.65 (H) 04/09/2023   BUN 27 (H) 04/09/2023   NA 137 04/09/2023   K 4.1 04/09/2023   CL 104 04/09/2023   CO2 22 04/09/2023   Lab Results  Component Value Date   CHOL 251 (H) 03/03/2022   HDL 96.90 03/03/2022   LDLCALC 135 (H) 03/03/2022   TRIG 100.0 03/03/2022   CHOLHDL 3 03/03/2022   Medications Reviewed Today     Reviewed by Lenna Gilford, RPH (Pharmacist) on 06/07/23 at 1509  Med List Status: <None>   Medication Order Taking? Sig Documenting Provider Last Dose Status Informant  Blood Pressure Monitoring  (COMFORT TOUCH BP CUFF/LARGE) MISC [id number]  1 Device by Does not apply route daily as needed. Karie Georges, MD  Active   cetirizine (ZYRTEC) 10 MG tablet [id number] Yes Take 1 tablet (10 mg total) by mouth daily. Karie Georges, MD Taking Active   Cyanocobalamin (B-12 PO) [id number] Yes Take 1,000 mcg by mouth daily. [provider] Taking Active   hydrALAZINE (APRESOLINE) 25 MG tablet [id number] Yes TAKE 1 TABLET BY MOUTH THREE TIMES A DAY Karie Georges, MD Taking Active   Iron, Ferrous Sulfate, 325 (65 Fe) MG TABS [id number] No Take 325 mg by mouth daily.  Patient not taking: Reported on 06/07/2023   Karie Georges, MD Not Taking Active   levothyroxine (SYNTHROID) 88 MCG tablet [id number] Yes Take 1 tablet (88 mcg total) by mouth daily. Karie Georges, MD Taking Active   lisinopril (ZESTRIL) 40 MG tablet [id number] Yes Take 1 tablet (40 mg total) by mouth daily. Karie Georges, MD Taking Active   Multiple Vitamin (MULTIVITAMIN PO) [id number] Yes Take by mouth daily. [provider] Taking Active   Wound Dressings [facility] BORDER FLEX) PADS [id number] No Apply 1 Pad topically daily. Karie Georges, MD Unknown Active            Assessment/Plan:   Hypertension: - Currently uncontrolled - Recommended to check home blood pressure and heart rate 2-3x/week - If BP remains elevated, could consider increasing hydralazine to 50mg  TID  Follow Up Plan: None scheduled but can as needed per PCP  Lenna Gilford, PharmD, DPLA

## 2023-06-12 ENCOUNTER — Ambulatory Visit: Payer: Medicare HMO | Admitting: Family Medicine

## 2023-06-21 ENCOUNTER — Encounter (INDEPENDENT_AMBULATORY_CARE_PROVIDER_SITE_OTHER): Payer: Self-pay

## 2023-07-17 ENCOUNTER — Encounter: Payer: Self-pay | Admitting: Family Medicine

## 2023-07-17 ENCOUNTER — Ambulatory Visit (INDEPENDENT_AMBULATORY_CARE_PROVIDER_SITE_OTHER): Payer: Medicare HMO | Admitting: Family Medicine

## 2023-07-17 VITALS — BP 162/70 | HR 70 | Temp 98.2°F | Ht 62.0 in | Wt 138.1 lb

## 2023-07-17 DIAGNOSIS — E890 Postprocedural hypothyroidism: Secondary | ICD-10-CM | POA: Diagnosis not present

## 2023-07-17 DIAGNOSIS — I10 Essential (primary) hypertension: Secondary | ICD-10-CM | POA: Diagnosis not present

## 2023-07-17 DIAGNOSIS — I5032 Chronic diastolic (congestive) heart failure: Secondary | ICD-10-CM | POA: Diagnosis not present

## 2023-07-17 DIAGNOSIS — D649 Anemia, unspecified: Secondary | ICD-10-CM

## 2023-07-17 MED ORDER — IRON (FERROUS SULFATE) 325 (65 FE) MG PO TABS
325.0000 mg | ORAL_TABLET | Freq: Every day | ORAL | 5 refills | Status: DC
Start: 2023-07-17 — End: 2023-10-11

## 2023-07-17 MED ORDER — AMLODIPINE BESYLATE 5 MG PO TABS
5.0000 mg | ORAL_TABLET | Freq: Every day | ORAL | 1 refills | Status: DC
Start: 2023-07-17 — End: 2023-10-09

## 2023-07-17 NOTE — Progress Notes (Signed)
Established Patient Office Visit  Subjective   Patient ID: Linda Lucero, female    DOB: Apr 22, 1928  Age: 89 y.o. MRN: [id number]  Chief Complaint  Patient presents with   Medical Management of Chronic Issues   Shortness of Breath    X1 week   Blurred Vision    Intermittently x2-3 months    Pt woke up this morning feeling nauseous. States that she has trouble sleeping at night and she feels like her breath is "getting shorter" for the past couple of weeks. States that her vision is also getting more burry. Patient did increase her lisinopril to 40 mg daily. She also did increase her levothyroxine to 88 mcg daily. She reports no side effects to the medication.  Pt did not start the iron supplements. States that she struggles with constipation at baseline and was worried that it would make it worse.   Pt is reporting constipation also, states that she cannot do as much as she used to.     Current Outpatient Medications  Medication Instructions   amLODipine (NORVASC) 5 mg, Oral, Daily   Blood Pressure Monitoring (COMFORT TOUCH BP CUFF/LARGE) MISC 1 Device, Does not apply, Daily PRN   cetirizine (ZYRTEC) 10 mg, Oral, Daily   Cyanocobalamin (B-12 PO) 1,000 mcg, Oral, Daily   hydrALAZINE (APRESOLINE) 25 mg, Oral, 3 times daily   Iron (Ferrous Sulfate) 325 mg, Oral, Daily   levothyroxine (SYNTHROID) 88 mcg, Oral, Daily   lisinopril (ZESTRIL) 40 mg, Oral, Daily   Multiple Vitamin (MULTIVITAMIN PO) Oral, Daily   Wound Dressings (MEPILEX BORDER FLEX) PADS 1 Pad, Apply externally, Daily      Review of Systems  All other systems reviewed and are negative.     Objective:     BP (!) 162/70 (BP Location: Right Arm, Patient Position: Sitting, Cuff Size: Normal)   Pulse 70   Temp 98.2 F (36.8 C) (Oral)   Ht 5\' 2"  (1.575 m)   Wt 138 lb 1.6 oz (62.6 kg)   LMP  (LMP Unknown)   SpO2 96%   BMI 25.26 kg/m    Physical Exam Vitals reviewed.  Constitutional:      Appearance:  Normal appearance. She is well-groomed and normal weight.  Eyes:     Conjunctiva/sclera: Conjunctivae normal.  Cardiovascular:     Rate and Rhythm: Normal rate and regular rhythm.     Pulses: Normal pulses.     Heart sounds: S1 normal and S2 normal.  Pulmonary:     Effort: Pulmonary effort is normal.     Breath sounds: Normal breath sounds and air entry.  Abdominal:     General: Bowel sounds are normal.  Musculoskeletal:     Right lower leg: No edema.     Left lower leg: No edema.  Neurological:     Mental Status: She is alert and oriented to person, place, and time. Mental status is at baseline.     Gait: Gait is intact.  Psychiatric:        Mood and Affect: Mood and affect normal.        Speech: Speech normal.        Behavior: Behavior normal.        Judgment: Judgment normal.      No results found for any visits on 07/17/23.    The ASCVD Risk score (Arnett DK, et al., 2019) failed to calculate for the following reasons:   The 2019 ASCVD risk score is only valid for  ages 54 to 58    Assessment & Plan:  Postablative hypothyroidism Assessment & Plan: We had increased her dose of levothyroxine to 88 mcg, she needs a new TSH to decide if she needs further adjustment, order placed  Orders: -     TSH  Primary hypertension Assessment & Plan: BP remains elevated despite the increase in lisinopril. Will add amlodipine 5 mg daily and see her back in 2 months for a repeated BP check. This is likely causing hypertensive CM which may be causing the increasing shortness of breath, she did have grade 1 diastolic dysfunction on ECHO in 2022, will send back to cardiology for another ECHO for follow up.   Orders: -     Basic metabolic panel -     amLODIPine Besylate; Take 1 tablet (5 mg total) by mouth daily.  Dispense: 90 tablet; Refill: 1  Chronic diastolic congestive heart failure (HCC) -     Ambulatory referral to Cardiology  Anemia, unspecified type -     Iron (Ferrous  Sulfate); Take 325 mg by mouth daily.  Dispense: 30 tablet; Refill: 5   Low ferritin indicating iron deficiency, I encouraged her to start miralax once daily to help reduce constipation and start the iron tablets daily as well. Will recheck her labs in 2 months.   Return in about 2 months (around 09/16/2023) for HTN.    Karie Georges, MD

## 2023-07-17 NOTE — Assessment & Plan Note (Signed)
BP remains elevated despite the increase in lisinopril. Will add amlodipine 5 mg daily and see her back in 2 months for a repeated BP check. This is likely causing hypertensive CM which may be causing the increasing shortness of breath, she did have grade 1 diastolic dysfunction on ECHO in 2022, will send back to cardiology for another ECHO for follow up.

## 2023-07-17 NOTE — Assessment & Plan Note (Signed)
We had increased her dose of levothyroxine to 88 mcg, she needs a new TSH to decide if she needs further adjustment, order placed

## 2023-07-17 NOTE — Patient Instructions (Signed)
Senokot S-- 2 tablets every day  Continue the 1 scoop of miralax daily  Centrum Silver mutlivitamin

## 2023-07-18 LAB — BASIC METABOLIC PANEL
BUN: 23 mg/dL (ref 6–23)
CO2: 24 meq/L (ref 19–32)
Calcium: 9.8 mg/dL (ref 8.4–10.5)
Chloride: 106 meq/L (ref 96–112)
Creatinine, Ser: 1.59 mg/dL — ABNORMAL HIGH (ref 0.40–1.20)
GFR: 27.96 mL/min — ABNORMAL LOW (ref >60.00)
Glucose, Bld: 128 mg/dL — ABNORMAL HIGH (ref 70–99)
Potassium: 3.9 meq/L (ref 3.5–5.1)
Sodium: 139 meq/L (ref 135–145)

## 2023-07-18 LAB — TSH: TSH: 5.21 u[IU]/mL (ref 0.35–5.50)

## 2023-10-01 ENCOUNTER — Ambulatory Visit: Payer: Medicare HMO | Admitting: Family Medicine

## 2023-10-06 ENCOUNTER — Encounter: Payer: Self-pay | Admitting: Cardiology

## 2023-10-06 NOTE — Progress Notes (Unsigned)
Cardiology Office Note   Date:  10/09/2023   ID:  Linda Lucero, Linda Lucero 03-20-1928, MRN [id number]  PCP:  Karie Georges, MD  Cardiologist:   Rollene Rotunda, MD Referring:  Karie Georges, MD  No chief complaint on file.     History of Present Illness: Linda Lucero is a 89 y.o. female who was referred for evaluation of diastolic HF.  She has had some shortness of breath and has some difficult to control hypertension.  Her primary provider thought that she likely had some diastolic dysfunction.  EF was 55% on echo in 2022.  She did have severe concentric left ventricular hypertrophy on that echo.  She is actually here with her son today.  She lives with him.  She gets around with a cane and actually has her bedroom upstairs.  She says that she can walk up the stairs with her cane and she is not particularly dyspneic when she gets up stairs.  She does not have to stop.  I am sure she moves slowly.  She does not have PND or orthopnea.  She is not having any chest pressure, neck or arm discomfort.  Her biggest issue has been feeling "wobbly."  She probably has some dizziness when she changes position.  She is not however had syncope or even falls.  Sounds like she is careful.  Today her blood pressure is markedly elevated but she got lost in her building and was quite stressed.   Past Medical History:  Diagnosis Date   Coronary artery disease    Hyperlipidemia    Hypertension    Renal artery stenosis (HCC)    Sleep apnea    Mild per patient.  No CPAP.   Subclavian arterial stenosis (HCC)    Thyroid disease    Ventricular hypertrophy     Past Surgical History:  Procedure Laterality Date   CHOLECYSTECTOMY       Current Outpatient Medications  Medication Sig Dispense Refill   amLODipine (NORVASC) 10 MG tablet Take 1 tablet (10 mg total) by mouth daily. 180 tablet 3   Blood Pressure Monitoring (COMFORT TOUCH BP CUFF/LARGE) MISC 1 Device by Does not apply route daily  as needed. 1 each 0   cetirizine (ZYRTEC) 10 MG tablet Take 1 tablet (10 mg total) by mouth daily. 30 tablet 11   hydrALAZINE (APRESOLINE) 25 MG tablet TAKE 1 TABLET BY MOUTH THREE TIMES A DAY 270 tablet 1   Iron, Ferrous Sulfate, 325 (65 Fe) MG TABS Take 325 mg by mouth daily. 30 tablet 5   levothyroxine (SYNTHROID) 88 MCG tablet Take 1 tablet (88 mcg total) by mouth daily. 90 tablet 1   lisinopril (ZESTRIL) 40 MG tablet Take 1 tablet (40 mg total) by mouth daily. 90 tablet 1   Multiple Vitamin (MULTIVITAMIN PO) Take by mouth daily.     Wound Dressings (MEPILEX BORDER FLEX) PADS Apply 1 Pad topically daily. 30 each 5   Cyanocobalamin (B-12 PO) Take 1,000 mcg by mouth daily. (Patient not taking: Reported on 10/09/2023)     Current Facility-Administered Medications  Medication Dose Route Frequency Provider Last Rate Last Admin   cloNIDine (CATAPRES) tablet 0.1 mg  0.1 mg Oral Once Rollene Rotunda, MD        Allergies:   Diovan [valsartan] and Doxazosin    Social History:  The patient  reports that she has quit smoking. Her smoking use included cigarettes. She has never used smokeless tobacco.   Family  History:  The patient's family history includes Diabetes in her maternal grandmother; Heart disease in her maternal grandmother and mother; Heart disease (age of onset: 81) in her daughter; High blood pressure in her mother; Lung cancer in her father.    ROS:  Please see the history of present illness.   Otherwise, review of systems are positive for none.   All other systems are reviewed and negative.    PHYSICAL EXAM: VS:  BP (!) 160/67 (BP Location: Left Arm, Patient Position: Sitting) Comment: recheck after dose of clonidine. MD aware  Pulse (!) 58   Ht 5\' 7"  (1.702 m)   Wt 137 lb (62.1 kg)   LMP  (LMP Unknown)   SpO2 97%   BMI 21.46 kg/m  , BMI Body mass index is 21.46 kg/m. GENERAL:  Well appearing HEENT:  Pupils equal round and reactive, fundi not visualized, oral mucosa  unremarkable NECK:  No jugular venous distention, waveform within normal limits, carotid upstroke brisk and symmetric, no bruits, no thyromegaly LYMPHATICS:  No cervical, inguinal adenopathy LUNGS:  Clear to auscultation bilaterally BACK:  No CVA tenderness CHEST:  Unremarkable HEART:  PMI not displaced or sustained,S1 and S2 within normal limits, no S3, no S4, no clicks, no rubs, 2 out of 6 brief apical systolic murmur radiating slightly at aortic outflow tract, no diastolic murmurs ABD:  Flat, positive bowel sounds normal in frequency in pitch, no bruits, no rebound, no guarding, no midline pulsatile mass, no hepatomegaly, no splenomegaly EXT:  2 plus pulses throughout, no edema, no cyanosis no clubbing SKIN:  No rashes no nodules NEURO:  Cranial nerves II through XII grossly intact, motor grossly intact throughout [facility]:  Cognitively intact, oriented to person place and time    EKG:  EKG Interpretation Date/Time:  Tuesday October 09 2023 14:40:36 EST Ventricular Rate:  71 PR Interval:  134 QRS Duration:  98 QT Interval:  404 QTC Calculation: 439 R Axis:   -16  Text Interpretation: Sinus rhythm with frequent Premature ventricular complexes Possible Left atrial enlargement Left ventricular hypertrophy ( R in aVL , Sokolow-Lyon , Cornell product , Romhilt-Estes ) When compared with ECG of 25-Mar-2021 03:23,  rate is slower Confirmed by Rollene Rotunda ([id number]) on 10/09/2023 3:03:02 PM     Recent Labs: 04/09/2023: ALT 12; Hemoglobin 10.7; Platelets 342.0 07/17/2023: BUN 23; Creatinine, Ser 1.59; Potassium 3.9; Sodium 139; TSH 5.21    Lipid Panel    Component Value Date/Time   CHOL 251 (H) 03/03/2022 1429   TRIG 100.0 03/03/2022 1429   HDL 96.90 03/03/2022 1429   CHOLHDL 3 03/03/2022 1429   VLDL 20.0 03/03/2022 1429   LDLCALC 135 (H) 03/03/2022 1429   LDLCALC 113 (H) 05/14/2020 1544      Wt Readings from Last 3 Encounters:  10/09/23 137 lb (62.1 kg)  07/17/23 138 lb 1.6 oz  (62.6 kg)  04/09/23 144 lb 1.6 oz (65.4 kg)      Other studies Reviewed: Additional studies/ records that were reviewed today include: Primary care records. Review of the above records demonstrates:  Please see elsewhere in the note.     ASSESSMENT AND PLAN:  Chronic diastolic dysfunction:   I will check a BNP level.  She clearly has some diastolic dysfunction unsure but does not have acute volume overload.  Management will be control of her blood pressure.  HTN: Today in the office I checked her blood pressure in both arms and it was 220/80 and equal.  She was given  clonidine to try to bring her pressure down.  Although she might be having some orthostatic symptoms we still need to go up on her medications to better control her home blood pressure.  I am going to increase her amlodipine to 10 mg.  She can remain on the other meds as listed and keep a blood pressure diary.  Would like to have her come back in a couple of weeks with that to our Hypertension Clinic and she might need titration of her hydralazine.  Of note I see years ago that she had abnormal renal artery duplex.  This was thought to indicate less than 60% stenosis.  I am going to order repeat renal Dopplers.  CAD: There is a mention of this in her notes and past medical history but I do not see any documentation.  CKD IIIB: She has some renal insufficiency and we will need to keep an eye on this as we better control her blood pressure.  Subclavian stenosis: This was mentioned years ago and there was left less than 50% stenosis.  I do not think this is hemodynamically significant.  No change in therapy.  Dyslipidemia: Given her advanced age I am not going to start a statin.  LDL 135.  Chronic heart   Current medicines are reviewed at length with the patient today.  The patient does not have concerns regarding medicines.  The following changes have been made:  no change  Labs/ tests ordered today include:   Orders Placed  This Encounter  Procedures   Basic metabolic panel   B Nat Peptide   AMB Referral to [facility]   EKG 12-Lead   VAS US RENAL ARTERY DUPLEX     Disposition:   FU with HTN clinic in two weeks.     Signed, Rollene Rotunda, MD  10/09/2023 4:47 PM    [facility]

## 2023-10-09 ENCOUNTER — Ambulatory Visit: Payer: Medicare HMO | Attending: Cardiology | Admitting: Cardiology

## 2023-10-09 ENCOUNTER — Encounter: Payer: Self-pay | Admitting: Cardiology

## 2023-10-09 VITALS — BP 160/67 | HR 58 | Ht 67.0 in | Wt 137.0 lb

## 2023-10-09 DIAGNOSIS — I701 Atherosclerosis of renal artery: Secondary | ICD-10-CM

## 2023-10-09 DIAGNOSIS — I2510 Atherosclerotic heart disease of native coronary artery without angina pectoris: Secondary | ICD-10-CM | POA: Diagnosis not present

## 2023-10-09 DIAGNOSIS — E785 Hyperlipidemia, unspecified: Secondary | ICD-10-CM

## 2023-10-09 DIAGNOSIS — I5032 Chronic diastolic (congestive) heart failure: Secondary | ICD-10-CM | POA: Diagnosis not present

## 2023-10-09 DIAGNOSIS — I160 Hypertensive urgency: Secondary | ICD-10-CM

## 2023-10-09 MED ORDER — CLONIDINE HCL 0.1 MG PO TABS
0.1000 mg | ORAL_TABLET | Freq: Once | ORAL | Status: DC
Start: 2023-10-09 — End: 2023-12-21

## 2023-10-09 MED ORDER — AMLODIPINE BESYLATE 10 MG PO TABS
10.0000 mg | ORAL_TABLET | Freq: Every day | ORAL | 3 refills | Status: AC
Start: 2023-10-09 — End: 2024-01-07

## 2023-10-09 NOTE — Patient Instructions (Signed)
Medication Instructions:  Start taking amlodipine 10 mg daily. New script sent *If you need a refill on your cardiac medications before your next appointment, please call your pharmacy*   Lab Work: BMET, BNP today If you have labs (blood work) drawn today and your tests are completely normal, you will receive your results only by: MyChart Message (if you have MyChart) OR A paper copy in the mail If you have any lab test that is abnormal or we need to change your treatment, we will call you to review the results.   Testing/Procedures: Your physician has requested that you have a renal artery duplex. During this test, an ultrasound is used to evaluate blood flow to the kidneys. Allow one hour for this exam. Do not eat after midnight the day before and avoid carbonated beverages. Take your medications as you usually do.    Follow-Up: At [facility], you and your health needs are our priority.  As part of our continuing mission to provide you with exceptional heart care, we have created designated Provider Care Teams.  These Care Teams include your primary Cardiologist (physician) and Advanced Practice Providers (APPs -  Physician Assistants and Nurse Practitioners) who all work together to provide you with the care you need, when you need it.  Your next appointment:    As needed after seeing the Pharm D team  Provider:   Rollene Rotunda, MD     Other Instructions

## 2023-10-10 LAB — BASIC METABOLIC PANEL
BUN/Creatinine Ratio: 16 (ref 12–28)
BUN: 25 mg/dL (ref 10–36)
CO2: 19 mmol/L — ABNORMAL LOW (ref 20–29)
Calcium: 9.6 mg/dL (ref 8.7–10.3)
Chloride: 105 mmol/L (ref 96–106)
Creatinine, Ser: 1.53 mg/dL — ABNORMAL HIGH (ref 0.57–1.00)
Glucose: 98 mg/dL (ref 70–99)
Potassium: 5.1 mmol/L (ref 3.5–5.2)
Sodium: 141 mmol/L (ref 134–144)
eGFR: 32 mL/min/{1.73_m2} — ABNORMAL LOW (ref >59)

## 2023-10-11 ENCOUNTER — Encounter: Payer: Self-pay | Admitting: Family Medicine

## 2023-10-11 ENCOUNTER — Ambulatory Visit: Payer: Medicare HMO | Admitting: Family Medicine

## 2023-10-11 DIAGNOSIS — Z23 Encounter for immunization: Secondary | ICD-10-CM | POA: Diagnosis not present

## 2023-10-11 DIAGNOSIS — E890 Postprocedural hypothyroidism: Secondary | ICD-10-CM

## 2023-10-11 DIAGNOSIS — D508 Other iron deficiency anemias: Secondary | ICD-10-CM | POA: Diagnosis not present

## 2023-10-11 DIAGNOSIS — I10 Essential (primary) hypertension: Secondary | ICD-10-CM | POA: Diagnosis not present

## 2023-10-11 DIAGNOSIS — D509 Iron deficiency anemia, unspecified: Secondary | ICD-10-CM | POA: Insufficient documentation

## 2023-10-11 LAB — TSH: TSH: 4.82 u[IU]/mL (ref 0.35–5.50)

## 2023-10-11 LAB — CBC
HCT: 31.1 % — ABNORMAL LOW (ref 36.0–46.0)
Hemoglobin: 10.8 g/dL — ABNORMAL LOW (ref 12.0–15.0)
MCHC: 34.6 g/dL (ref 30.0–36.0)
MCV: 66.2 fL — ABNORMAL LOW (ref 78.0–100.0)
Platelets: 333 10*3/uL (ref 150.0–400.0)
RBC: 4.7 Mil/uL (ref 3.87–5.11)
RDW: 20.6 % — ABNORMAL HIGH (ref 11.5–15.5)
WBC: 9 10*3/uL (ref 4.0–10.5)

## 2023-10-11 MED ORDER — BLOOD PRESSURE MONITOR/ARM DEVI: 1.0000 | Freq: Every day | 0 refills | Status: DC

## 2023-10-11 MED ORDER — TELMISARTAN 80 MG PO TABS: 80.0000 mg | ORAL_TABLET | Freq: Every day | ORAL | 1 refills | Status: DC

## 2023-10-11 MED ORDER — LEVOTHYROXINE SODIUM 88 MCG PO TABS: 88.0000 ug | ORAL_TABLET | Freq: Every day | ORAL | 1 refills | Status: DC

## 2023-10-11 MED ORDER — HYDRALAZINE HCL 25 MG PO TABS: 25.0000 mg | ORAL_TABLET | Freq: Three times a day (TID) | ORAL | 1 refills | Status: DC

## 2023-10-11 NOTE — Progress Notes (Signed)
Established Patient Office Visit  Subjective   Patient ID: Linda Lucero, female    DOB: [birth year]  Age: 89 y.o. MRN: [id number]  Chief Complaint  Patient presents with   Medical Management of Chronic Issues    Pt is here for follow up today. States that she went to the cardiology office and BP was elevated there. They increased her  amlodipine to 10 mg daily and continued her lisinopril 40 mg and the hydralazine 25 mg TID. ECHO showed severe LVH from long standing HTN. BMP showed stable kidney function and BNP. BP remains elevated today in the office. The patient denies any side effects from the medication increase.   Hypothyroidism-- pt reports no weight loss/gain, no constipation or depression symptoms. Is due for refills today, needs new TSH also for surveillance.   Anemia-- pt needs follow up on this, has not had repeat levels checked-- states she has trouble tolerating the iron pills-- they cause severe constipation.     Current Outpatient Medications  Medication Instructions   amLODipine (NORVASC) 10 mg, Oral, Daily   Blood Pressure Monitoring (BLOOD PRESSURE MONITOR/ARM) DEVI 1 each, Does not apply, Daily   cetirizine (ZYRTEC) 10 mg, Oral, Daily   Cyanocobalamin (B-12 PO) 1,000 mcg, Oral, Daily   ferrous sulfate 150 mg, Oral, Every other day   hydrALAZINE (APRESOLINE) 25 mg, Oral, 3 times daily   levothyroxine (SYNTHROID) 88 mcg, Oral, Daily   Multiple Vitamin (MULTIVITAMIN PO) Oral, Daily   telmisartan (MICARDIS) 80 mg, Oral, Daily   Wound Dressings (MEPILEX BORDER FLEX) PADS 1 Pad, Apply externally, Daily    Patient Active Problem List   Diagnosis Date Noted   Iron deficiency anemia 10/11/2023   B12 deficiency 07/03/2022   Malnutrition of moderate degree 03/28/2021   CHF (congestive heart failure) (HCC) 03/25/2021   Acute on chronic diastolic CHF (congestive heart failure) (HCC)    Elevated troponin    Acute CHF (HCC) 03/24/2021   Coronary artery disease     Low back pain 09/06/2018   Hypertension 09/06/2018   Chronic kidney disease, stage 3a (HCC) 09/06/2018   Hypothyroidism 09/06/2018      Review of Systems  All other systems reviewed and are negative.     Objective:     BP (!) 160/60 (BP Location: Right Arm, Patient Position: Sitting, Cuff Size: Normal)   Pulse 75   Temp 98.2 F (36.8 C) (Oral)   Ht 5\' 7"  (1.702 m)   Wt 141 lb 6.4 oz (64.1 kg)   LMP  (LMP Unknown)   SpO2 99%   BMI 22.15 kg/m    Physical Exam Vitals reviewed.  Constitutional:      Appearance: Normal appearance. She is well-groomed and normal weight.  Eyes:     Conjunctiva/sclera: Conjunctivae normal.  Neck:     Thyroid: No thyromegaly.  Cardiovascular:     Rate and Rhythm: Normal rate and regular rhythm.     Pulses: Normal pulses.     Heart sounds: S1 normal and S2 normal.  Pulmonary:     Effort: Pulmonary effort is normal.     Breath sounds: Normal breath sounds and air entry.  Musculoskeletal:     Right lower leg: No edema.     Left lower leg: No edema.  Neurological:     Mental Status: She is alert and oriented to person, place, and time. Mental status is at baseline.     Gait: Gait is intact.  Psychiatric:  Mood and Affect: Mood and affect normal.        Speech: Speech normal.        Behavior: Behavior normal.        Judgment: Judgment normal.      The ASCVD Risk score (Arnett DK, et al., 2019) failed to calculate for the following reasons:   The 2019 ASCVD risk score is only valid for ages 3 to 41    Assessment & Plan:  Need for immunization against influenza -     Flu Vaccine Trivalent High Dose (Fluad)  Postablative hypothyroidism Assessment & Plan: On 88 mcg daily, needs new TSH level done   Orders: -     TSH -     Levothyroxine Sodium; Take 1 tablet (88 mcg total) by mouth daily.  Dispense: 90 tablet; Refill: 1  Primary hypertension Assessment & Plan: Continued elevation today, we discussed switching her from  lisinopril to telmisartan which can be a stronger medication,  will continue the amlodipine 10 mg daily and the hydralazine 25 mg TID for BP control. Pt also needs a BP cuff to check her BP at home as well. Orders placed. She has a follow up appt with cardiology in December and January so they can recheck her pressure at those visits. I will see her back in 6 months.   Orders: -     Telmisartan; Take 1 tablet (80 mg total) by mouth daily.  Dispense: 90 tablet; Refill: 1 -     hydrALAZINE HCl; Take 1 tablet (25 mg total) by mouth 3 (three) times daily.  Dispense: 270 tablet; Refill: 1 -     Blood Pressure Monitor/Arm; 1 each by Does not apply route daily.  Dispense: 1 each; Refill: 0  Iron deficiency anemia secondary to inadequate dietary iron intake Assessment & Plan: Pt could not tolerate the iron supplements by mouth-- states that she got severe constipation with the iron pills. Will order patient iron infusion instead x1. Will recheck her CBC and iron panel before ordering the iron infusion.  Orders: -     Iron, TIBC and Ferritin Panel -     CBC -     Ferrous Sulfate; Take 2.5 mLs (150 mg total) by mouth every other day.  Dispense: 150 mL; Refill: 3     Return in about 6 months (around 04/10/2024).    Karie Georges, MD

## 2023-10-11 NOTE — Assessment & Plan Note (Addendum)
Pt could not tolerate the iron supplements by mouth-- states that she got severe constipation with the iron pills. Will order patient iron infusion instead x1. Will recheck her CBC and iron panel before ordering the iron infusion.

## 2023-10-11 NOTE — Patient Instructions (Signed)
STOP lisinopril  REPLACE it with the telmisartan  Continue the 10 mg amlodipine daily and also your hydralazine

## 2023-10-12 LAB — IRON,TIBC AND FERRITIN PANEL
%SAT: 23 % (ref 16–45)
Ferritin: 31 ng/mL (ref 16–288)
Iron: 69 ug/dL (ref 45–160)
TIBC: 295 ug/dL (ref 250–450)

## 2023-10-12 MED ORDER — FERROUS SULFATE 300 (60 FE) MG/5ML PO SOLN: 150.0000 mg | ORAL | 3 refills | Status: DC

## 2023-10-15 ENCOUNTER — Telehealth: Payer: Self-pay | Admitting: Family Medicine

## 2023-10-15 NOTE — Telephone Encounter (Signed)
Pt says pharmacy does not have ferrous sulfate 300 (60 Fe) MG/5ML syrup they only have 240. Requesting a call with guidance as to what to do going forward

## 2023-10-16 NOTE — Assessment & Plan Note (Signed)
On 88 mcg daily, needs new TSH level done

## 2023-10-16 NOTE — Telephone Encounter (Addendum)
Spoke with Abby at [facility], informed her of the change as below and she stated they will contact the patient when the Rx is ready for pick up.

## 2023-10-16 NOTE — Telephone Encounter (Signed)
Ok to change it to the 240 -- same sig and refills

## 2023-10-16 NOTE — Assessment & Plan Note (Signed)
Continued elevation today, we discussed switching her from lisinopril to telmisartan which can be a stronger medication,  will continue the amlodipine 10 mg daily and the hydralazine 25 mg TID for BP control. Pt also needs a BP cuff to check her BP at home as well. Orders placed. She has a follow up appt with cardiology in December and January so they can recheck her pressure at those visits. I will see her back in 6 months.

## 2023-10-25 ENCOUNTER — Ambulatory Visit (HOSPITAL_COMMUNITY)
Admission: RE | Admit: 2023-10-25 | Discharge: 2023-10-25 | Disposition: A | Discharge status: Home / Self Care | Payer: Medicare HMO | Source: Ambulatory Visit | Attending: Internal Medicine | Admitting: Internal Medicine

## 2023-10-25 DIAGNOSIS — I160 Hypertensive urgency: Secondary | ICD-10-CM | POA: Diagnosis not present

## 2023-10-25 DIAGNOSIS — I701 Atherosclerosis of renal artery: Secondary | ICD-10-CM | POA: Insufficient documentation

## 2023-11-19 ENCOUNTER — Ambulatory Visit: Payer: Medicare HMO

## 2023-12-04 ENCOUNTER — Ambulatory Visit (INDEPENDENT_AMBULATORY_CARE_PROVIDER_SITE_OTHER): Payer: Medicare HMO | Admitting: Family Medicine

## 2023-12-04 DIAGNOSIS — Z0000 Encounter for general adult medical examination without abnormal findings: Secondary | ICD-10-CM | POA: Diagnosis not present

## 2023-12-04 NOTE — Progress Notes (Signed)
PATIENT CHECK-IN and HEALTH RISK ASSESSMENT QUESTIONNAIRE:  -completed by phone/video for upcoming Medicare Preventive Visit  Pre-Visit Check-in: 1)Vitals (height, wt, BP, etc) - record in vitals section for visit on day of visit Request home vitals (wt, BP, etc.) and enter into vitals, THEN update Vital Signs SmartPhrase below at the top of the HPI. See below.  2)Review and Update Medications, Allergies PMH, Surgeries, Social history in Epic 3)Hospitalizations in the last year with date/reason? no  4)Review and Update Care Team (patient's specialists) in Epic 5) Complete PHQ9 in Epic  6) Complete Fall Screening in Epic 7)Review all Health Maintenance Due and order under PCP if not done.  Medicare Wellness Patient Questionnaire:  Answer theses question about your habits: How often do you have a drink containing alcohol?no Have you ever smoked?no On average, how many days per week do you engage in moderate to strenuous exercise (like a brisk walk)?goes up and down the step 3-4 times per day which is good exercise for her, does some bed exercises for about an hour 1x per week  Son takes her out to [employer] - she takes cane Does meals on wheels for some meals, sometimes makes salad or cream of wheat, feels she eats well, cereal for breakfast  Beverages:  orange juice, water  Answer theses question about your everyday activities: Can you perform most household chores?yes Are you deaf or have significant trouble hearing? Feels hearing was a little off, has wax in the ears, used otc kit and is better Do you feel that you have a problem with memory? Not much, occasionally draws a blank - but it usually comes to her Do you feel safe at home? yes Last dentist visit?  8. Do you have any difficulty performing your everyday activities?no Are you having any difficulty walking, taking medications on your own, and or difficulty managing daily home needs? no Do you have difficulty walking or climbing  stairs?no Do you have difficulty dressing or bathing?no Do you have difficulty doing errands alone such as visiting a doctor's office or shopping? Son goes with her Do you currently have any difficulty preparing food and eating?no Do you currently have any difficulty using the toilet?no Do you have any difficulty managing your finances? no Do you have any difficulties with housekeeping of managing your housekeeping? yes   Do you have Advanced Directives in place (Living Will, Healthcare Power or Attorney)?  no   Last eye Exam and location? Reports vision is ok - had visit over 1 year ago, had cataract surgery    Do you have a history or close family history of breast, ovarian, tubal or peritoneal cancer or a family member with BRCA (breast cancer susceptibility 1 and 2) gene mutations?     ----------------------------------------------------------------------------------------------------------------------------------------------------------------------------------------------------------------------  Because this visit was a virtual/telehealth visit, some criteria may be missing or patient reported. Any vitals not documented were not able to be obtained and vitals that have been documented are patient reported.    MEDICARE ANNUAL PREVENTIVE VISIT WITH PROVIDER: (Welcome to Medicare, initial annual wellness or annual wellness exam)  Virtual Visit via Phone Note  I connected with Linda Lucero on 12/04/23 by phone  and verified that I am speaking with the correct person using two identifiers. Patient prefers phone.   Location patient: home Location provider:work or home office Persons participating in the virtual visit: patient, provider  Concerns and/or follow up today: lives with her son.    See HM section in Epic for other  details of completed HM.    ROS: negative for report of fevers, unintentional weight loss, vision changes, vision loss, hearing loss or change, chest  pain, sob, hemoptysis, melena, hematochezia, hematuria, falls, bleeding or bruising, thoughts of suicide or self harm, memory loss  Patient-completed extensive health risk assessment - reviewed and discussed with the patient: See Health Risk Assessment completed with patient prior to the visit either above or in recent phone note. This was reviewed in detailed with the patient today and appropriate recommendations, orders and referrals were placed as needed per Summary below and patient instructions.   Review of Medical History: -PMH, PSH, Family History and current specialty and care providers reviewed and updated and listed below   Patient Care Team: Karie Georges, MD as PCP - General (Family Medicine) Rollene Rotunda, MD as PCP - Cardiology (Cardiology) Verner Chol, [facility] (Inactive) as Pharmacist (Pharmacist)   Past Medical History:  Diagnosis Date   Coronary artery disease    Hyperlipidemia    Hypertension    Renal artery stenosis (HCC)    Sleep apnea    Mild per patient.  No CPAP.   Subclavian arterial stenosis (HCC)    Thyroid disease    Ventricular hypertrophy     Past Surgical History:  Procedure Laterality Date   CHOLECYSTECTOMY      Social History   Socioeconomic History   Marital status: Widowed    Spouse name: Not on file   Number of children: 4   Years of education: Not on file   Highest education level: Associate degree: occupational, Scientist, product/process development, or vocational program  Occupational History   Occupation: retired    Comment: retired  Tobacco Use   Smoking status: Former    Types: Cigarettes   Smokeless tobacco: Never  Substance and Sexual Activity   Alcohol use: Not on file   Drug use: Not on file   Sexual activity: Not Currently    Partners: Male  Other Topics Concern   Not on file  Social History Narrative   Lives with son in 2 level home; has 1 son in [place], and 2 other children who lives outside of [place].   Social Drivers of Manufacturing engineer Strain: Low Risk  (03/19/2023)   Overall Financial Resource Strain (CARDIA)    Difficulty of Paying Living Expenses: Not hard at all  Food Insecurity: No Food Insecurity (03/19/2023)   Hunger Vital Sign    Worried About Running Out of Food in the Last Year: Never true    Ran Out of Food in the Last Year: Never true  Transportation Needs: Unmet Transportation Needs (03/19/2023)   PRAPARE - Administrator, Civil Service (Medical): Yes    Lack of Transportation (Non-Medical): Yes  Physical Activity: Insufficiently Active (03/19/2023)   Exercise Vital Sign    Days of Exercise per Week: 2 days    Minutes of Exercise per Session: 30 min  Stress: No Stress Concern Present (03/19/2023)   [employer] of Occupational Health - Occupational Stress Questionnaire    Feeling of Stress : Not at all  Social Connections: Moderately Isolated (03/19/2023)   Social Connection and Isolation Panel [NHANES]    Frequency of Communication with Friends and Family: Twice a week    Frequency of Social Gatherings with Friends and Family: Once a week    Attends Religious Services: Never    Database administrator or Organizations: Yes    Attends Banker Meetings: 1  to 4 times per year    Marital Status: Widowed  Intimate Partner Violence: Not At Risk (05/26/2022)   Humiliation, Afraid, Rape, and Kick questionnaire    Fear of Current or Ex-Partner: No    Emotionally Abused: No    Physically Abused: No    Sexually Abused: No    Family History  Problem Relation Age of Onset   Heart disease Mother    High blood pressure Mother    Lung cancer Father    Diabetes Maternal Grandmother    Heart disease Maternal Grandmother    Heart disease Daughter 30       Died with MI    Current Outpatient Medications on File Prior to Visit  Medication Sig Dispense Refill   amLODipine (NORVASC) 10 MG tablet Take 1 tablet (10 mg total) by mouth daily. 180 tablet 3   Blood  Pressure Monitoring (BLOOD PRESSURE MONITOR/ARM) DEVI 1 each by Does not apply route daily. 1 each 0   cetirizine (ZYRTEC) 10 MG tablet Take 1 tablet (10 mg total) by mouth daily. 30 tablet 11   Cyanocobalamin (B-12 PO) Take 1,000 mcg by mouth daily.     ferrous sulfate 300 (60 Fe) MG/5ML syrup Take 2.5 mLs (150 mg total) by mouth every other day. 150 mL 3   hydrALAZINE (APRESOLINE) 25 MG tablet Take 1 tablet (25 mg total) by mouth 3 (three) times daily. 270 tablet 1   levothyroxine (SYNTHROID) 88 MCG tablet Take 1 tablet (88 mcg total) by mouth daily. 90 tablet 1   Multiple Vitamin (MULTIVITAMIN PO) Take by mouth daily.     telmisartan (MICARDIS) 80 MG tablet Take 1 tablet (80 mg total) by mouth daily. 90 tablet 1   Wound Dressings (MEPILEX BORDER FLEX) PADS Apply 1 Pad topically daily. 30 each 5   Current Facility-Administered Medications on File Prior to Visit  Medication Dose Route Frequency Provider Last Rate Last Admin   cloNIDine (CATAPRES) tablet 0.1 mg  0.1 mg Oral Once Rollene Rotunda, MD        Allergies  Allergen Reactions   Doxazosin     Pt reports not allergic 03/25/21       Physical Exam Vitals requested from patient and listed below if patient had equipment and was able to obtain at home for this virtual visit: There were no vitals filed for this visit. Estimated body mass index is 22.15 kg/m as calculated from the following:   Height as of 10/11/23: 5\' 7"  (1.702 m).   Weight as of 10/11/23: 141 lb 6.4 oz (64.1 kg).  EKG (optional): deferred due to virtual visit  GENERAL: alert, oriented, no acute distress detected, full vision exam deferred due to pandemic and/or virtual encounter  PSYCH/NEURO: pleasant and cooperative, no obvious depression or anxiety, speech and thought processing grossly intact, Cognitive function grossly intact  Flowsheet Row Office Visit from [facility]/17/2023 in [facility]  PHQ-9 Total Score 11            12/04/2023   11:24 AM 07/17/2023    1:39 PM 05/26/2022    2:19 PM 05/25/2021    2:02 PM 05/25/2020    2:33 PM  Depression screen PHQ 2/9  Decreased Interest 0 0 0 0 0  Down, Depressed, Hopeless 1 3 0 0 0  PHQ - 2 Score 1 3 0 0 0  Altered sleeping  3     Tired, decreased energy  3     Change in appetite  2     Feeling bad or failure about yourself   0     Trouble concentrating  0     Moving slowly or fidgety/restless  0     Suicidal thoughts  0     PHQ-9 Score  11     Reports she is doing better. Her faith helps her. Gets to interact with son who lives with her and with church member who comes to her house to visit.      05/26/2022    2:23 PM 03/19/2023   11:33 AM 04/09/2023    1:48 PM 10/11/2023    2:12 PM 12/04/2023   11:23 AM  Fall Risk  Falls in the past year? 0 0 0 0 0  Was there an injury with Fall? 0  0 0 0  Fall Risk Category Calculator 0  0 0 0  Fall Risk Category (Retired) Low      (RETIRED) Patient Fall Risk Level Low fall risk      Patient at Risk for Falls Due to No Fall Risks  No Fall Risks No Fall Risks   Fall risk Follow up   Falls evaluation completed Falls evaluation completed Falls evaluation completed;Education provided;Falls prevention discussed     SUMMARY AND PLAN:  Encounter for annual wellness exam in Medicare patient    Discussed applicable health maintenance/preventive health measures and advised and referred or ordered per patient preferences: -she plans to get the covid vaccine at the pharmacy Health Maintenance  Topic Date Due   COVID-19 Vaccine (5 - 2024-25 season) 07/08/2023   Zoster Vaccines- Shingrix (1 of 2) 12/03/2028 (Originally 08/31/1978)   Medicare Annual Wellness (AWV)  12/03/2024   DTaP/Tdap/Td (2 - Td or Tdap) 10/25/2027   Pneumonia Vaccine 53+ Years old  Completed   INFLUENZA VACCINE  Completed   DEXA SCAN  Completed   HPV VACCINES  Aged Out      Education and counseling on the following was provided based on the above review  of health and a plan/checklist for the patient, along with additional information discussed, was provided for the patient in the patient instructions :  -Advised on importance of completing advanced directives, discussed options for completing and provided information in patient instructions as well -Provided counseling and plan for increased risk of falling if applicable per above screening. Reviewed and demonstrated safe balance exercises that can be done at home to improve balance and discussed exercise guidelines for adults with include balance exercises at least 3 days per week.  -Advised and counseled on a healthy lifestyle - including the importance of a healthy diet, regular physical activity, social connections  -talked about getting out of the house more with her son to go shopping  -Reviewed patient's current diet. Advised and counseled on a whole foods based healthy diet. A summary of a healthy diet was provided in the Patient Instructions. Discussed staying hydrated with plenty of water.  -reviewed patient's current physical activity level and discussed exercise guidelines for adults. Discussed community resources and ideas for safe exercise at home to assist in meeting exercise guideline recommendations in a safe and healthy way. Discussed chair exercises she can do.  -Advise yearly dental visits at minimum and regular eye exams   Follow up: see patient instructions     Patient Instructions  I really enjoyed getting to talk with you today! I am available on Tuesdays and Thursdays for virtual visits if you have any questions or concerns, or if I can  be of any further assistance.   CHECKLIST FROM ANNUAL WELLNESS VISIT:  -Follow up (please call to schedule if not scheduled after visit):   -yearly for annual wellness visit with primary care office  Here is a list of your preventive care/health maintenance measures and the plan for each if any are due:  PLAN For any measures below  that may be due:  -you can get the updated covid vaccine at the pharmacy, please let us know when you do so that we can update your records.  Health Maintenance  Topic Date Due   COVID-19 Vaccine (5 - 2024-25 season) 07/08/2023   Zoster Vaccines- Shingrix (1 of 2) 12/03/2028 (Originally 08/31/1978)   Medicare Annual Wellness (AWV)  12/03/2024   DTaP/Tdap/Td (2 - Td or Tdap) 10/25/2027   Pneumonia Vaccine 69+ Years old  Completed   INFLUENZA VACCINE  Completed   DEXA SCAN  Completed   HPV VACCINES  Aged Out    -See a dentist at least yearly  -Get your eyes checked and then per your eye specialist's recommendations  -Other issues addressed today:   -I have included below further information regarding a healthy whole foods based diet, physical activity guidelines for adults, stress management and opportunities for social connections. I hope you find this information useful.   -----------------------------------------------------------------------------------------------------------------------------------------------------------------------------------------------------------------------------------------------------------  NUTRITION: -eat real food: lots of colorful vegetables (half the plate) and fruits -5-7 servings of vegetables and fruits per day (fresh or steamed is best), exp. 2 servings of vegetables with lunch and dinner and 2 servings of fruit per day. Berries and greens such as kale and collards are great choices.  -consume on a regular basis: whole grains (make sure first ingredient on label contains the word "whole"), fresh fruits, fish, nuts, seeds, healthy oils (such as olive oil, avocado oil) -may eat small amounts of dairy and lean meat on occasion, but avoid processed meats such as ham, bacon, lunch meat, etc. -drink water -try to avoid fast food and pre-packaged foods, processed meat -most experts advise limiting sodium to < 2300mg  per day, should limit further is any  chronic conditions such as high blood pressure, heart disease, diabetes, etc. The American Heart Association advised that < 1500mg  is is ideal -try to avoid foods that contain any ingredients with names you do not recognize  -try to avoid sugar/sweets (except for the natural sugar that occurs in fresh fruit) -try to avoid sweet drinks -try to avoid white rice, white bread, pasta (unless whole grain), white or yellow potatoes  EXERCISE GUIDELINES FOR ADULTS: -if you wish to increase your physical activity, do so gradually and with the approval of your doctor -STOP and seek medical care immediately if you have any chest pain, chest discomfort or trouble breathing when starting or increasing exercise  -move and stretch your body, legs, feet and arms when sitting for long periods -Physical activity guidelines for optimal health in adults: -least 150 minutes per week of aerobic exercise (can talk, but not sing) once approved by your doctor, 20-30 minutes of sustained activity or two 10 minute episodes of sustained activity every day.  -resistance training at least 2 days per week if approved by your doctor -balance exercises 3+ days per week:   Stand somewhere where you have something sturdy to hold onto if you lose balance.    1) lift up on toes, start with 5x per day and work up to 20x   2) stand and lift on leg straight out to the side  so that foot is a few inches of the floor, start with 5x each side and work up to 20x each side   3) stand on one foot, start with 5 seconds each side and work up to 20 seconds on each side  If you need ideas or help with getting more active:  -Silver sneakers https://tools.silversneakers.com  -Walk with a Doc: http://www.duncan-williams.com/  -try to include resistance (weight lifting/strength building) and balance exercises twice per week: or the following link for  ideas: http://castillo-powell.com/  BuyDucts.dk  STRESS MANAGEMENT: -can try meditating, or just sitting quietly with deep breathing while intentionally relaxing all parts of your body for 5 minutes daily -if you need further help with stress, anxiety or depression please follow up with your primary doctor or contact the wonderful folks at [facility]: [phone]  SOCIAL CONNECTIONS: -options in [place] if you wish to engage in more social and exercise related activities:  -Silver sneakers https://tools.silversneakers.com  -Walk with a Doc: http://www.duncan-williams.com/  -Check out the [facility] Active Adults 50+ section on the [place] of [employer] (hiking clubs, book clubs, cards and games, chess, exercise classes, aquatic classes and much more) - see the website for details: https://www.[place].gov/departments/parks-recreation/active-adults50  -YouTube has lots of exercise videos for different ages and abilities as well  -[facility] (a variety of indoor and outdoor inperson activities for adults). [phone]. [address]  -Virtual Online Classes (a variety of topics): see seniorplanet.org or call [phone]  -consider volunteering at a school, hospice center, church, senior center or elsewhere         ADVANCED HEALTHCARE DIRECTIVES:  [place] Advanced Directives assistance:   ExpressWeek.com.cy  Everyone should have advanced health care directives in place. This is so that you get the care you want, should you ever be in a situation where you are unable to make your own medical decisions.   From the [place] Advanced Directive Website: "Advance Health Care Directives are legal documents in which you give written instructions about your health care if, in the future, you cannot speak  for yourself.   A health care power of attorney allows you to name a person you trust to make your health care decisions if you cannot make them yourself. A declaration of a desire for a natural death (or living will) is document, which states that you desire not to have your life prolonged by extraordinary measures if you have a terminal or incurable illness or if you are in a vegetative state. An advance instruction for mental health treatment makes a declaration of instructions, information and preferences regarding your mental health treatment. It also states that you are aware that the advance instruction authorizes a mental health treatment provider to act according to your wishes. It may also outline your consent or refusal of mental health treatment. A declaration of an anatomical gift allows anyone over the age of 81 to make a gift by will, organ donor card or other document."   Please see the following website or an elder law attorney for forms, FAQs and for completion of advanced directives: [place] TEFL teacher Health Care Directives Advance Health Care Directives (http://guzman.com/)  Or copy and paste the following to your web browser: PoshChat.fi    Terressa Koyanagi, DO

## 2023-12-04 NOTE — Patient Instructions (Signed)
I really enjoyed getting to talk with you today! I am available on Tuesdays and Thursdays for virtual visits if you have any questions or concerns, or if I can be of any further assistance.   CHECKLIST FROM ANNUAL WELLNESS VISIT:  -Follow up (please call to schedule if not scheduled after visit):   -yearly for annual wellness visit with primary care office  Here is a list of your preventive care/health maintenance measures and the plan for each if any are due:  PLAN For any measures below that may be due:  -you can get the updated covid vaccine at the pharmacy, please let us know when you do so that we can update your records.  Health Maintenance  Topic Date Due   COVID-19 Vaccine (5 - 2024-25 season) 07/08/2023   Zoster Vaccines- Shingrix (1 of 2) 12/03/2028 (Originally 08/31/1980)   Medicare Annual Wellness (AWV)  12/03/2024   DTaP/Tdap/Td (2 - Td or Tdap) 10/25/2027   Pneumonia Vaccine 39+ Years old  Completed   INFLUENZA VACCINE  Completed   DEXA SCAN  Completed   HPV VACCINES  Aged Out    -See a dentist at least yearly  -Get your eyes checked and then per your eye specialist's recommendations  -Other issues addressed today:   -I have included below further information regarding a healthy whole foods based diet, physical activity guidelines for adults, stress management and opportunities for social connections. I hope you find this information useful.   -----------------------------------------------------------------------------------------------------------------------------------------------------------------------------------------------------------------------------------------------------------  NUTRITION: -eat real food: lots of colorful vegetables (half the plate) and fruits -5-7 servings of vegetables and fruits per day (fresh or steamed is best), exp. 2 servings of vegetables with lunch and dinner and 2 servings of fruit per day. Berries and greens such as kale and  collards are great choices.  -consume on a regular basis: whole grains (make sure first ingredient on label contains the word "whole"), fresh fruits, fish, nuts, seeds, healthy oils (such as olive oil, avocado oil) -may eat small amounts of dairy and lean meat on occasion, but avoid processed meats such as ham, bacon, lunch meat, etc. -drink water -try to avoid fast food and pre-packaged foods, processed meat -most experts advise limiting sodium to < 2300mg  per day, should limit further is any chronic conditions such as high blood pressure, heart disease, diabetes, etc. The American Heart Association advised that < 1500mg  is is ideal -try to avoid foods that contain any ingredients with names you do not recognize  -try to avoid sugar/sweets (except for the natural sugar that occurs in fresh fruit) -try to avoid sweet drinks -try to avoid white rice, white bread, pasta (unless whole grain), white or yellow potatoes  EXERCISE GUIDELINES FOR ADULTS: -if you wish to increase your physical activity, do so gradually and with the approval of your doctor -STOP and seek medical care immediately if you have any chest pain, chest discomfort or trouble breathing when starting or increasing exercise  -move and stretch your body, legs, feet and arms when sitting for long periods -Physical activity guidelines for optimal health in adults: -least 150 minutes per week of aerobic exercise (can talk, but not sing) once approved by your doctor, 20-30 minutes of sustained activity or two 10 minute episodes of sustained activity every day.  -resistance training at least 2 days per week if approved by your doctor -balance exercises 3+ days per week:   Stand somewhere where you have something sturdy to hold onto if you lose balance.  1) lift up on toes, start with 5x per day and work up to 20x   2) stand and lift on leg straight out to the side so that foot is a few inches of the floor, start with 5x each side and  work up to 20x each side   3) stand on one foot, start with 5 seconds each side and work up to 20 seconds on each side  If you need ideas or help with getting more active:  -Silver sneakers https://tools.silversneakers.com  -Walk with a Doc: http://www.duncan-williams.com/  -try to include resistance (weight lifting/strength building) and balance exercises twice per week: or the following link for ideas: http://castillo-powell.com/  BuyDucts.dk  STRESS MANAGEMENT: -can try meditating, or just sitting quietly with deep breathing while intentionally relaxing all parts of your body for 5 minutes daily -if you need further help with stress, anxiety or depression please follow up with your primary doctor or contact the wonderful folks at [facility]: [phone]  SOCIAL CONNECTIONS: -options in [place] if you wish to engage in more social and exercise related activities:  -Silver sneakers https://tools.silversneakers.com  -Walk with a Doc: http://www.duncan-williams.com/  -Check out the [facility] Active Adults 50+ section on the [place] of [employer] (hiking clubs, book clubs, cards and games, chess, exercise classes, aquatic classes and much more) - see the website for details: https://www.[place].gov/departments/parks-recreation/active-adults50  -YouTube has lots of exercise videos for different ages and abilities as well  -[facility] (a variety of indoor and outdoor inperson activities for adults). [phone]. [address].  -Virtual Online Classes (a variety of topics): see seniorplanet.org or call 9515606358  -consider volunteering at a school, hospice center, church, senior center or elsewhere         ADVANCED HEALTHCARE DIRECTIVES:  [place] Advanced Directives  assistance:   ExpressWeek.com.cy  Everyone should have advanced health care directives in place. This is so that you get the care you want, should you ever be in a situation where you are unable to make your own medical decisions.   From the [place] Advanced Directive Website: "Advance Health Care Directives are legal documents in which you give written instructions about your health care if, in the future, you cannot speak for yourself.   A health care power of attorney allows you to name a person you trust to make your health care decisions if you cannot make them yourself. A declaration of a desire for a natural death (or living will) is document, which states that you desire not to have your life prolonged by extraordinary measures if you have a terminal or incurable illness or if you are in a vegetative state. An advance instruction for mental health treatment makes a declaration of instructions, information and preferences regarding your mental health treatment. It also states that you are aware that the advance instruction authorizes a mental health treatment provider to act according to your wishes. It may also outline your consent or refusal of mental health treatment. A declaration of an anatomical gift allows anyone over the age of 59 to make a gift by will, organ donor card or other document."   Please see the following website or an elder law attorney for forms, FAQs and for completion of advanced directives: [place] Health Care Directives Advance Health Care Directives (http://guzman.com/)  Or copy and paste the following to your web browser: PoshChat.fi

## 2023-12-12 ENCOUNTER — Other Ambulatory Visit: Payer: Self-pay | Admitting: Family Medicine

## 2023-12-12 DIAGNOSIS — I10 Essential (primary) hypertension: Secondary | ICD-10-CM

## 2023-12-21 ENCOUNTER — Ambulatory Visit: Payer: Medicare HMO | Attending: Internal Medicine | Admitting: Pharmacist

## 2023-12-21 ENCOUNTER — Other Ambulatory Visit (HOSPITAL_COMMUNITY): Payer: Self-pay

## 2023-12-21 VITALS — BP 209/65 | HR 77

## 2023-12-21 DIAGNOSIS — I10 Essential (primary) hypertension: Secondary | ICD-10-CM | POA: Diagnosis not present

## 2023-12-21 MED ORDER — OMRON 3 SERIES BP MONITOR DEVI
0 refills | Status: AC
  Filled 2023-12-21: qty 1, 30d supply, fill #0

## 2023-12-21 NOTE — Patient Instructions (Addendum)
It was nice meeting you two today  Your blood pressure today is elevated.   Please continue your amlodipine 10mg  daily and telmisartan 80mg  daily and your hydralazine three times a day  I have sent in a new blood pressure cuff to [facility]  Please let us know if you have any questions   Laural Golden, PharmD, BCACP, CDCES, CPP [address] [place], [place], [address] Phone: [phone], Fax: [phone]

## 2023-12-21 NOTE — Progress Notes (Signed)
 Patient ID: Linda Lucero                 DOB: December 25, 1927                      MRN: [id number]     HPI: Linda Lucero is a 89 y.o. female referred by Dr. Antoine Poche to HTN clinic. PMH is significant for HTN, CHF, and CKD.   Patient presents today with son who she lives with. Son keeps a close eye on her. Son has a blood pressure cuff at home but he reports it is old. Patient's pcp prescribed a blood pressure cuff at her last visit but she has not picked up. She reports her home blood pressure readings are typically in 140s/60s and always elevated in office.  Patient receives her food from meals on wheels. Id not sure she is drinking enough water however.  Walks with a cane.  Currently managed on hydralazine, amlodipine and telmisartan (changed from lisinopril at last PCP visit). Reports no adverse effects to any meds but admits she occasionally misses midday dose of hydralazine.   Patient denies chest pain, swelling or SOB  Current HTN meds:  Amlodipine 10mg  daily Hydralazine 25mg  TID Telmisartan 80mg  daily  BP goal: <140/90   Wt Readings from Last 3 Encounters:  10/11/23 141 lb 6.4 oz (64.1 kg)  10/09/23 137 lb (62.1 kg)  07/17/23 138 lb 1.6 oz (62.6 kg)   BP Readings from Last 3 Encounters:  10/11/23 (!) 160/60  10/09/23 (!) 160/67  07/17/23 (!) 162/70   Pulse Readings from Last 3 Encounters:  10/11/23 75  10/09/23 (!) 58  07/17/23 70    Renal function: CrCl cannot be calculated (Patient's most recent lab result is older than the maximum 21 days allowed.).  Past Medical History:  Diagnosis Date   Coronary artery disease    Hyperlipidemia    Hypertension    Renal artery stenosis (HCC)    Sleep apnea    Mild per patient.  No CPAP.   Subclavian arterial stenosis (HCC)    Thyroid disease    Ventricular hypertrophy     Current Outpatient Medications on File Prior to Visit  Medication Sig Dispense Refill   amLODipine (NORVASC) 10 MG tablet Take 1 tablet (10 mg  total) by mouth daily. 180 tablet 3   Blood Pressure Monitoring (BLOOD PRESSURE MONITOR/ARM) DEVI 1 each by Does not apply route daily. 1 each 0   cetirizine (ZYRTEC) 10 MG tablet Take 1 tablet (10 mg total) by mouth daily. 30 tablet 11   Cyanocobalamin (B-12 PO) Take 1,000 mcg by mouth daily.     ferrous sulfate 300 (60 Fe) MG/5ML syrup Take 2.5 mLs (150 mg total) by mouth every other day. 150 mL 3   hydrALAZINE (APRESOLINE) 25 MG tablet Take 1 tablet (25 mg total) by mouth 3 (three) times daily. 270 tablet 1   levothyroxine (SYNTHROID) 88 MCG tablet Take 1 tablet (88 mcg total) by mouth daily. 90 tablet 1   Multiple Vitamin (MULTIVITAMIN PO) Take by mouth daily.     telmisartan (MICARDIS) 80 MG tablet Take 1 tablet (80 mg total) by mouth daily. 90 tablet 1   Wound Dressings (MEPILEX BORDER FLEX) PADS Apply 1 Pad topically daily. 30 each 5   Current Facility-Administered Medications on File Prior to Visit  Medication Dose Route Frequency Provider Last Rate Last Admin   cloNIDine (CATAPRES) tablet 0.1 mg  0.1 mg Oral Once Hochrein,  Fayrene Fearing, MD        Allergies  Allergen Reactions   Doxazosin     Pt reports not allergic 03/25/21     Assessment/Plan:  1. Hypertension -  Patient BP elevated today in room at 209/65 (originally measured at 229/72). Patient asymptomatic. Does not want to increase/change any medications at this point because she believes she has white coat hypertension. Encouraged patient to take a hydralazine when she arrives home and encouraged more frequent water intake. Will send Omron BP cuff to pharmacy and encouraged more frequent monitoring.  Continue: Amlodipine 10mg  daily Hydralazine 25mg  TID Telmisartan 80mg  daily  Laural Golden, PharmD, BCACP, CDCES, CPP [address], [address] [place], [place], [address] Phone: [phone], Fax: [phone]

## 2023-12-23 ENCOUNTER — Encounter: Payer: Self-pay | Admitting: Pharmacist

## 2024-04-10 ENCOUNTER — Ambulatory Visit: Payer: Medicare HMO | Admitting: Family Medicine

## 2024-04-11 ENCOUNTER — Other Ambulatory Visit: Payer: Self-pay | Admitting: Family Medicine

## 2024-04-11 DIAGNOSIS — I10 Essential (primary) hypertension: Secondary | ICD-10-CM

## 2024-04-11 DIAGNOSIS — E890 Postprocedural hypothyroidism: Secondary | ICD-10-CM

## 2024-04-22 ENCOUNTER — Encounter: Payer: Self-pay | Admitting: Family Medicine

## 2024-04-22 ENCOUNTER — Ambulatory Visit (INDEPENDENT_AMBULATORY_CARE_PROVIDER_SITE_OTHER): Admitting: Family Medicine

## 2024-04-22 VITALS — BP 162/62 | HR 72 | Temp 98.3°F | Ht 67.0 in | Wt 140.7 lb

## 2024-04-22 DIAGNOSIS — I10 Essential (primary) hypertension: Secondary | ICD-10-CM

## 2024-04-22 DIAGNOSIS — E890 Postprocedural hypothyroidism: Secondary | ICD-10-CM | POA: Diagnosis not present

## 2024-04-22 DIAGNOSIS — Z780 Asymptomatic menopausal state: Secondary | ICD-10-CM | POA: Diagnosis not present

## 2024-04-22 DIAGNOSIS — Z0000 Encounter for general adult medical examination without abnormal findings: Secondary | ICD-10-CM

## 2024-04-22 MED ORDER — HYDROCHLOROTHIAZIDE 25 MG PO TABS: 25.0000 mg | ORAL_TABLET | Freq: Every day | ORAL | 1 refills | Status: AC

## 2024-04-22 MED ORDER — HYDRALAZINE HCL 25 MG PO TABS: 25.0000 mg | ORAL_TABLET | Freq: Three times a day (TID) | ORAL | 1 refills | Status: AC

## 2024-04-22 MED ORDER — TELMISARTAN 80 MG PO TABS: 80.0000 mg | ORAL_TABLET | Freq: Every day | ORAL | 1 refills | Status: AC

## 2024-04-22 MED ORDER — LEVOTHYROXINE SODIUM 88 MCG PO TABS: 88.0000 ug | ORAL_TABLET | Freq: Every day | ORAL | 1 refills | Status: DC

## 2024-04-22 MED ORDER — AMLODIPINE BESYLATE 10 MG PO TABS: 10.0000 mg | ORAL_TABLET | Freq: Every day | ORAL | 3 refills | Status: AC

## 2024-04-22 NOTE — Patient Instructions (Addendum)
 Melatonin gummies -- start with 5 mg gummie every night 30-60 minutes before bedtime  ADD hydrochlorothiazide  25 mg once a day to the rest of your blood pressure medication.

## 2024-04-22 NOTE — Progress Notes (Signed)
 Complete physical exam  Patient: Linda Lucero   DOB: [birth year]   89 y.o. Female  MRN: [id number]  Subjective:    Chief Complaint  Patient presents with   Medical Management of Chronic Issues    Linda Lucero is a 89 y.o. female who presents today for a complete physical exam. She reports consuming a general diet. Home exercise routine includes walking 1-2 hrs per week. She generally feels well. She reports sleeping has difficulty falling asleep, only getting about 4-5 hours. She does have additional problems to discuss today.   HTN-- pt saw the pharmacist at heart care. Her BP was extremely elevated at that visit. State sthat at home she is getting elevated readings as well. BP today is also elevated. She denies any new symptoms, no chest pain, no SOB, no dizziness or headaches. I counseled the patient on adding medication for her BP and she is agreeable.   Most recent fall risk assessment:    12/04/2023   11:23 AM  Fall Risk   Falls in the past year? 0  Number falls in past yr: 0  Injury with Fall? 0  Follow up Falls evaluation completed;Education provided;Falls prevention discussed     Most recent depression screenings:    12/04/2023   11:24 AM 07/17/2023    1:39 PM  PHQ 2/9 Scores  PHQ - 2 Score 1 3  PHQ- 9 Score  11    Vision:Not within last year  and Dental: No current dental problems and Last dental visit: has false teeth -- doesn't need to see a dentist  Patient Active Problem List   Diagnosis Date Noted   Iron  deficiency anemia 10/11/2023   B12 deficiency 07/03/2022   Malnutrition of moderate degree 03/28/2021   CHF (congestive heart failure) (HCC) 03/25/2021   Acute on chronic diastolic CHF (congestive heart failure) (HCC)    Elevated troponin    Acute CHF (HCC) 03/24/2021   Coronary artery disease    Low back pain 09/06/2018   Hypertension 09/06/2018   Chronic kidney disease, stage 3a (HCC) 09/06/2018   Hypothyroidism 09/06/2018      Patient  Care Team: Aida House, MD as PCP - General (Family Medicine) Eilleen Grates, MD as PCP - Cardiology (Cardiology) Alver Austin, [facility] (Inactive) as Pharmacist (Pharmacist)   Outpatient Medications Prior to Visit  Medication Sig   Blood Pressure Monitoring (OMRON 3 SERIES BP MONITOR) DEVI Use to test blood pressure as needed   Cyanocobalamin  (B-12 PO) Take 1,000 mcg by mouth daily.   Multiple Vitamin (MULTIVITAMIN PO) Take by mouth daily.   [DISCONTINUED] amLODipine  (NORVASC ) 10 MG tablet Take 1 tablet (10 mg total) by mouth daily.   [DISCONTINUED] cetirizine  (ZYRTEC ) 10 MG tablet Take 1 tablet (10 mg total) by mouth daily.   [DISCONTINUED] ferrous sulfate  300 (60 Fe) MG/5ML syrup Take 2.5 mLs (150 mg total) by mouth every other day.   [DISCONTINUED] hydrALAZINE  (APRESOLINE ) 25 MG tablet Take 1 tablet (25 mg total) by mouth 3 (three) times daily.   [DISCONTINUED] levothyroxine  (SYNTHROID ) 88 MCG tablet Take 1 tablet (88 mcg total) by mouth daily.   [DISCONTINUED] telmisartan  (MICARDIS ) 80 MG tablet Take 1 tablet (80 mg total) by mouth daily.   [DISCONTINUED] Wound Dressings (MEPILEX BORDER FLEX) PADS Apply 1 Pad topically daily. (Patient not taking: Reported on 04/22/2024)   No facility-administered medications prior to visit.    Review of Systems  HENT:  Negative for hearing loss.   Eyes:  Negative  for blurred vision.  Respiratory:  Negative for shortness of breath.   Cardiovascular:  Negative for chest pain.  Gastrointestinal: Negative.   Genitourinary: Negative.   Musculoskeletal:  Negative for back pain.  Neurological:  Negative for headaches.  Psychiatric/Behavioral:  Negative for depression.        Objective:     BP (!) 162/62   Pulse 72   Temp 98.3 F (36.8 C) (Oral)   Ht 5' 7 (1.702 m)   Wt 140 lb 11.2 oz (63.8 kg)   LMP  (LMP Unknown)   SpO2 97%   BMI 22.04 kg/m    Physical Exam Vitals reviewed.  Constitutional:      Appearance: Normal  appearance. She is well-groomed and normal weight.  HENT:     Right Ear: Tympanic membrane and ear canal normal.     Left Ear: Tympanic membrane and ear canal normal.     Mouth/Throat:     Mouth: Mucous membranes are moist.     Pharynx: No posterior oropharyngeal erythema.   Eyes:     Conjunctiva/sclera: Conjunctivae normal.   Neck:     Thyroid : No thyromegaly.   Cardiovascular:     Rate and Rhythm: Normal rate and regular rhythm.     Pulses: Normal pulses.     Heart sounds: S1 normal and S2 normal.  Pulmonary:     Effort: Pulmonary effort is normal.     Breath sounds: Normal breath sounds and air entry.  Abdominal:     General: Abdomen is flat. Bowel sounds are normal.     Palpations: Abdomen is soft.   Musculoskeletal:     Right lower leg: Edema (mild ankle BLE) present.     Left lower leg: Edema present.  Lymphadenopathy:     Cervical: No cervical adenopathy.   Neurological:     Mental Status: She is alert and oriented to person, place, and time. Mental status is at baseline.     Gait: Gait is intact.   Psychiatric:        Mood and Affect: Mood and affect normal.        Speech: Speech normal.        Behavior: Behavior normal.        Judgment: Judgment normal.      No results found for any visits on 04/22/24.     Assessment & Plan:    Routine Health Maintenance and Physical Exam  Immunization History  Administered Date(s) Administered   Fluad Quad(high Dose 65+) 10/27/2020, 09/02/2021, 09/05/2022   Fluad Trivalent(High Dose 65+) 10/11/2023   Influenza, High Dose Seasonal PF 09/04/2018, 08/01/2019   Influenza-Unspecified 08/01/2019   PFIZER(Purple Top)SARS-COV-2 Vaccination 01/11/2020, 02/10/2020, 08/17/2020, 02/26/2021   PNEUMOCOCCAL CONJUGATE-20 05/25/2021   Pneumococcal Conjugate-13 10/24/2017   Tdap 10/24/2017   Zoster, Live 10/02/2016    Health Maintenance  Topic Date Due   COVID-19 Vaccine (5 - 2024-25 season) 07/08/2023   Zoster Vaccines-  Shingrix (1 of 2) 12/03/2028 (Originally 08/31/1980)   INFLUENZA VACCINE  06/06/2024   Medicare Annual Wellness (AWV)  12/03/2024   DTaP/Tdap/Td (2 - Td or Tdap) 10/25/2027   Pneumococcal Vaccine: 50+ Years  Completed   DEXA SCAN  Completed   HPV VACCINES  Aged Out   Meningococcal B Vaccine  Aged Out    Discussed health benefits of physical activity, and encouraged her to engage in regular exercise appropriate for her age and condition.  Routine adult health maintenance  Normal physical exam findings except for the elevated  blood pressure which is chronic and asymptomatic. I counseled the patient on the recommended amount of exercise per CDC recommendation. I reviewed preventative screening, immunizations, and medical history and updated in the chart, and appropriate labs and vaccinations were ordered. Handouts given on healthy eating and exercise.    Postablative hypothyroidism -     Levothyroxine  Sodium; Take 1 tablet (88 mcg total) by mouth daily.  Dispense: 90 tablet; Refill: 1 -     TSH; Future  Primary hypertension Chronic, uncontrolled, pt is agreeable to starting an additional BP medication, will call in hydrochlorothiazide  25 mg once daily to add to her regimen. Will see her back in 3 months for BP recheck   -     hydrALAZINE  HCl; Take 1 tablet (25 mg total) by mouth 3 (three) times daily.  Dispense: 270 tablet; Refill: 1 -     Telmisartan ; Take 1 tablet (80 mg total) by mouth daily.  Dispense: 90 tablet; Refill: 1 -     amLODIPine  Besylate; Take 1 tablet (10 mg total) by mouth daily.  Dispense: 180 tablet; Refill: 3 -     hydroCHLOROthiazide ; Take 1 tablet (25 mg total) by mouth daily.  Dispense: 90 tablet; Refill: 1  Postmenopausal state -     DG Bone Density; Future    Return in about 3 months (around 07/23/2024) for HTN -- BP recheck.     Aida House, MD

## 2024-04-23 LAB — TSH: TSH: 5.83 u[IU]/mL — ABNORMAL HIGH (ref 0.35–5.50)

## 2024-04-25 ENCOUNTER — Ambulatory Visit: Payer: Self-pay | Admitting: Family Medicine

## 2024-04-25 DIAGNOSIS — E890 Postprocedural hypothyroidism: Secondary | ICD-10-CM

## 2024-04-25 MED ORDER — LEVOTHYROXINE SODIUM 100 MCG PO TABS: 100.0000 ug | ORAL_TABLET | Freq: Every day | ORAL | 1 refills | Status: DC

## 2024-06-19 ENCOUNTER — Ambulatory Visit (INDEPENDENT_AMBULATORY_CARE_PROVIDER_SITE_OTHER)
Admission: RE | Admit: 2024-06-19 | Discharge: 2024-06-19 | Disposition: A | Discharge status: Home / Self Care | Source: Ambulatory Visit | Attending: Family Medicine | Admitting: Family Medicine

## 2024-06-19 DIAGNOSIS — Z780 Asymptomatic menopausal state: Secondary | ICD-10-CM | POA: Diagnosis not present

## 2024-06-27 ENCOUNTER — Other Ambulatory Visit

## 2024-06-27 DIAGNOSIS — E890 Postprocedural hypothyroidism: Secondary | ICD-10-CM

## 2024-06-27 LAB — TSH: TSH: 1.14 u[IU]/mL (ref 0.35–5.50)

## 2024-06-30 ENCOUNTER — Ambulatory Visit: Payer: Self-pay | Admitting: Family Medicine

## 2024-08-18 DIAGNOSIS — Z9989 Dependence on other enabling machines and devices: Secondary | ICD-10-CM | POA: Diagnosis not present

## 2024-08-18 DIAGNOSIS — Z79890 Hormone replacement therapy (postmenopausal): Secondary | ICD-10-CM | POA: Diagnosis not present

## 2024-08-18 DIAGNOSIS — E785 Hyperlipidemia, unspecified: Secondary | ICD-10-CM | POA: Diagnosis not present

## 2024-08-18 DIAGNOSIS — M1990 Unspecified osteoarthritis, unspecified site: Secondary | ICD-10-CM | POA: Diagnosis not present

## 2024-08-18 DIAGNOSIS — J301 Allergic rhinitis due to pollen: Secondary | ICD-10-CM | POA: Diagnosis not present

## 2024-08-18 DIAGNOSIS — I10 Essential (primary) hypertension: Secondary | ICD-10-CM | POA: Diagnosis not present

## 2024-08-18 DIAGNOSIS — I2510 Atherosclerotic heart disease of native coronary artery without angina pectoris: Secondary | ICD-10-CM | POA: Diagnosis not present

## 2024-08-18 DIAGNOSIS — F32A Depression, unspecified: Secondary | ICD-10-CM | POA: Diagnosis not present

## 2024-08-18 DIAGNOSIS — E890 Postprocedural hypothyroidism: Secondary | ICD-10-CM | POA: Diagnosis not present

## 2024-10-01 ENCOUNTER — Ambulatory Visit: Payer: Self-pay

## 2024-10-01 NOTE — Telephone Encounter (Signed)
 Left a detailed message at the patient's home number stating she should go to the nearest ER or urgent care for evaluation and not wait over the weekend.

## 2024-10-01 NOTE — Telephone Encounter (Signed)
 FYI Only or Action Required?: FYI only for provider: appointment scheduled on 10/06/2024 at 3 PM and patient is asking if medication can be sent in for the swelling she is currently having. Asking for a call back from PCP.  Patient was last seen in primary care on 04/22/2024 by Ozell Heron HERO, MD.  Called Nurse Triage reporting Leg Swelling.  Symptoms began several days ago.  Interventions attempted: Rest, hydration, or home remedies.  Symptoms are: unchanged.  Triage Disposition: See PCP When Office is Open (Within 3 Days)  Patient/caregiver understands and will follow disposition?: Yes  Copied from CRM [id number]. Topic: Clinical - Red Word Triage >> Oct 01, 2024 12:07 PM Alfonso HERO wrote: Red Word that prompted transfer to Nurse Triage: edema in legs causing swelling and big toe is hurting Reason for Disposition  [1] MILD swelling of both ankles (i.e., pedal edema) AND [2] new-onset or getting worse  Answer Assessment - Initial Assessment Questions Patient reports mild to moderate lower leg swelling-worse on the right leg than left. No pain, fever, any redness or signs of infection. Patient is wanting medication sent to pharmacy to help with the swelling if possible. Appointment was scheduled for 10/06/2024 at 3 PM with PCP for patient to be seen. Patient is asking for a phone call if medication will get sent today.   1. ONSET: When did the swelling start? (e.g., minutes, hours, days)     Started last Saturday 2. LOCATION: What part of the leg is swollen?  Are both legs swollen or just one leg?     Bilateral legs-worse in the right than left. Located mid calf down to ankles 3. SEVERITY: How bad is the swelling? (e.g., localized; mild, moderate, severe)     Mild to moderate 4. REDNESS: Is there redness or signs of infection?     no 5. PAIN: Is the swelling painful to touch? If Yes, ask: How painful is it?   (Scale 1-10; mild, moderate or severe)     No pain 6. FEVER:  Do you have a fever? If Yes, ask: What is it, how was it measured, and when did it start?      no 7. CAUSE: What do you think is causing the leg swelling?     unsure 8. MEDICAL HISTORY: Do you have a history of blood clots (e.g., DVT), cancer, heart failure, kidney disease, or liver failure?     no 9. RECURRENT SYMPTOM: Have you had leg swelling before? If Yes, ask: When was the last time? What happened that time?     Yes but over a year ago 10. OTHER SYMPTOMS: Do you have any other symptoms? (e.g., chest pain, difficulty breathing)       no  Protocols used: Leg Swelling and Edema-A-AH

## 2024-10-05 NOTE — Telephone Encounter (Signed)
 Noted- ok to close.

## 2024-10-06 ENCOUNTER — Ambulatory Visit: Admitting: Family Medicine

## 2024-11-12 ENCOUNTER — Other Ambulatory Visit: Payer: Self-pay | Admitting: Family Medicine

## 2024-11-12 DIAGNOSIS — E890 Postprocedural hypothyroidism: Secondary | ICD-10-CM

## 2024-12-10 ENCOUNTER — Other Ambulatory Visit: Payer: Self-pay | Admitting: Family Medicine

## 2024-12-10 DIAGNOSIS — E890 Postprocedural hypothyroidism: Secondary | ICD-10-CM
